# Patient Record
Sex: Female | Born: 1955 | Race: White | Hispanic: No | Marital: Married | Smoking: Never smoker
Health system: Southern US, Community
[De-identification: ages and names within clinical notes are randomized; demographics above are authoritative.]

## PROBLEM LIST (undated history)

## (undated) DIAGNOSIS — N183 Chronic kidney disease, stage 3 unspecified: Secondary | ICD-10-CM

## (undated) DIAGNOSIS — I10 Essential (primary) hypertension: Secondary | ICD-10-CM

## (undated) DIAGNOSIS — E669 Obesity, unspecified: Secondary | ICD-10-CM

## (undated) DIAGNOSIS — I5189 Other ill-defined heart diseases: Secondary | ICD-10-CM

## (undated) DIAGNOSIS — I739 Peripheral vascular disease, unspecified: Secondary | ICD-10-CM

## (undated) DIAGNOSIS — I2510 Atherosclerotic heart disease of native coronary artery without angina pectoris: Secondary | ICD-10-CM

## (undated) DIAGNOSIS — E785 Hyperlipidemia, unspecified: Secondary | ICD-10-CM

## (undated) HISTORY — DX: Obesity, unspecified: E66.9

## (undated) HISTORY — PX: CARDIAC CATHETERIZATION: SHX172

## (undated) HISTORY — DX: Peripheral vascular disease, unspecified: I73.9

## (undated) HISTORY — DX: Atherosclerotic heart disease of native coronary artery without angina pectoris: I25.10

## (undated) HISTORY — DX: Other ill-defined heart diseases: I51.89

## (undated) HISTORY — DX: Essential (primary) hypertension: I10

---

## 2008-08-08 ENCOUNTER — Encounter: Payer: Self-pay | Admitting: Cardiovascular Disease

## 2008-08-22 ENCOUNTER — Encounter: Payer: Self-pay | Admitting: Cardiovascular Disease

## 2008-09-05 ENCOUNTER — Encounter: Payer: Self-pay | Admitting: Cardiovascular Disease

## 2008-09-05 ENCOUNTER — Other Ambulatory Visit: Payer: Self-pay

## 2008-09-05 ENCOUNTER — Observation Stay: Payer: Self-pay | Admitting: *Deleted

## 2008-09-05 ENCOUNTER — Ambulatory Visit: Payer: Self-pay | Admitting: Cardiovascular Disease

## 2009-02-02 IMAGING — CR CERVICAL SPINE - 2-3 VIEW
1 series · 6 of 6 positions shown · non-contrast
Comparison: none

REASON FOR EXAM: neck pain
COMMENTS:

[Series 1: view not recorded · 0.17mm/px · 6 of 6 slices shown]
[im 1/6]
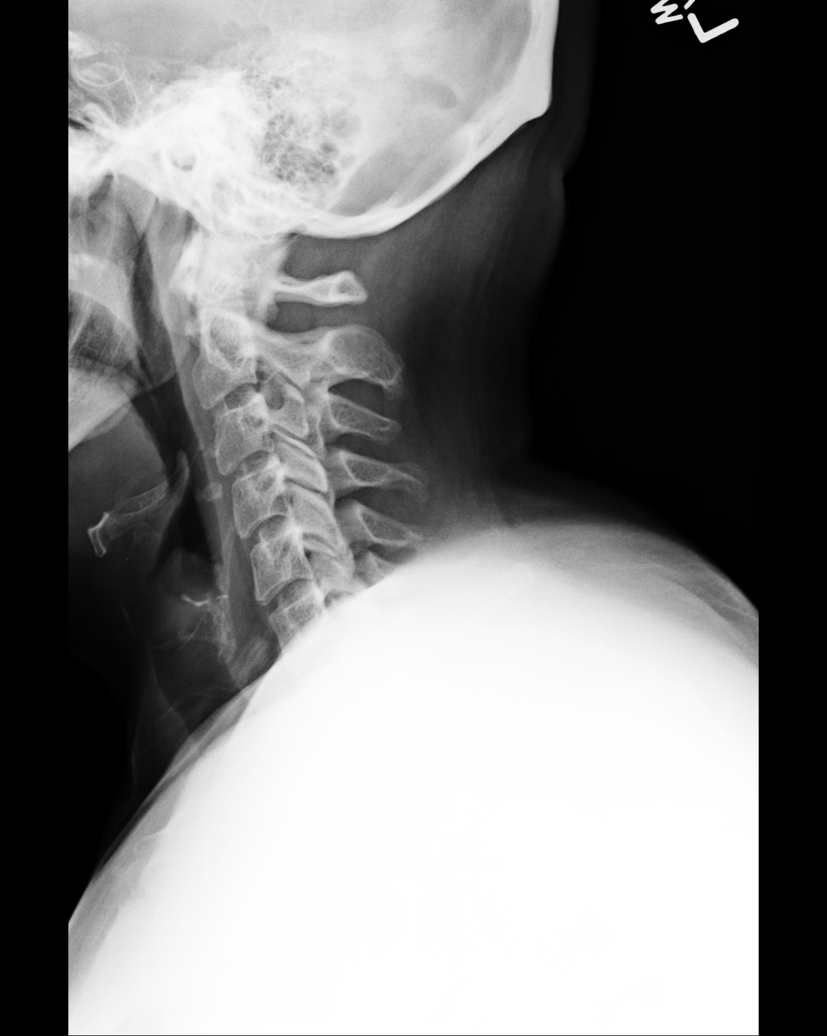
[im 2/6]
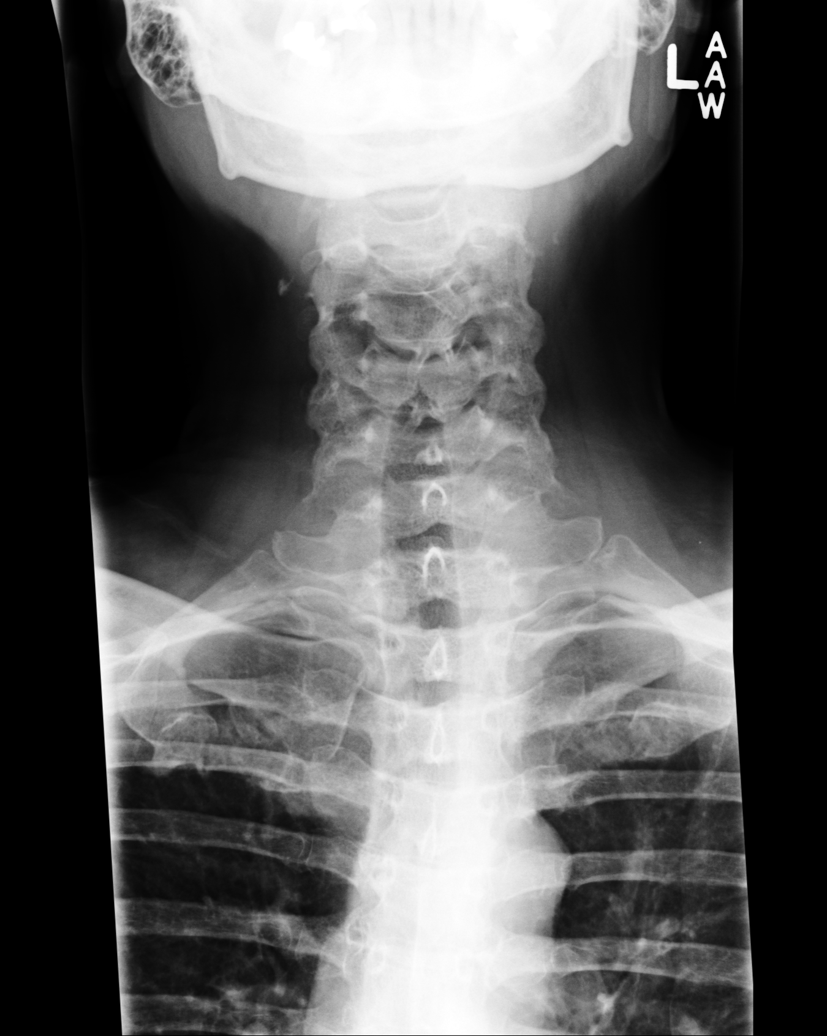
[im 3/6]
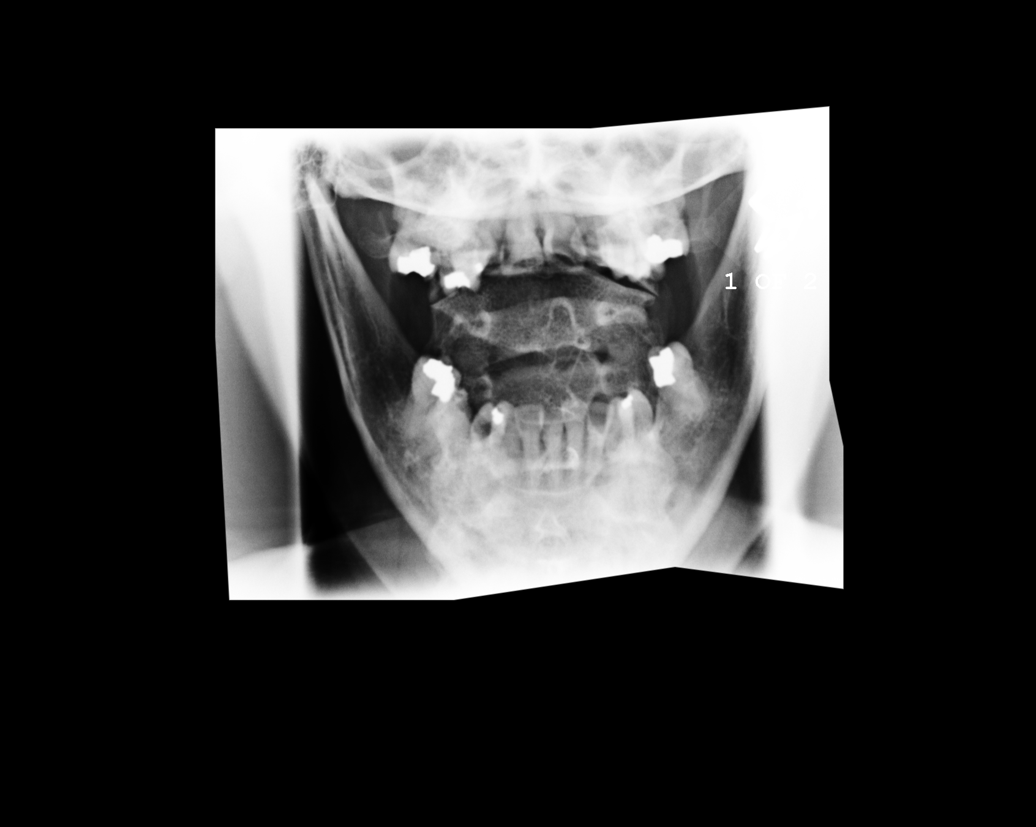
[im 4/6]
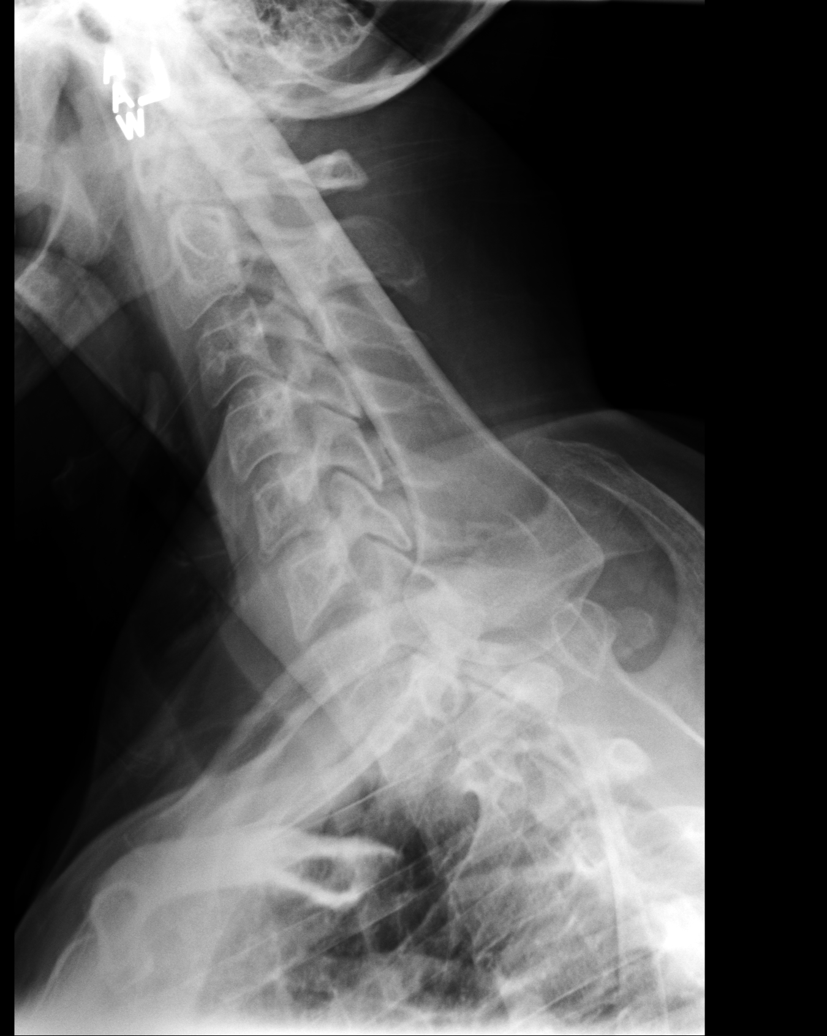
[im 5/6]
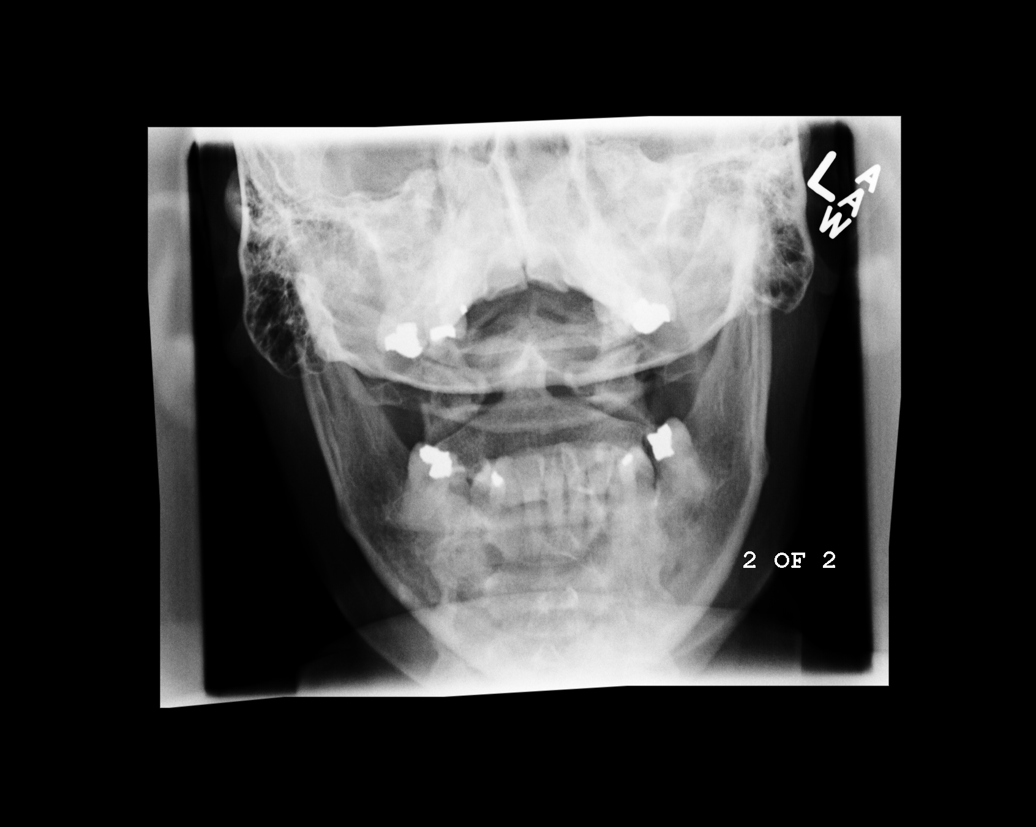
[im 6/6]
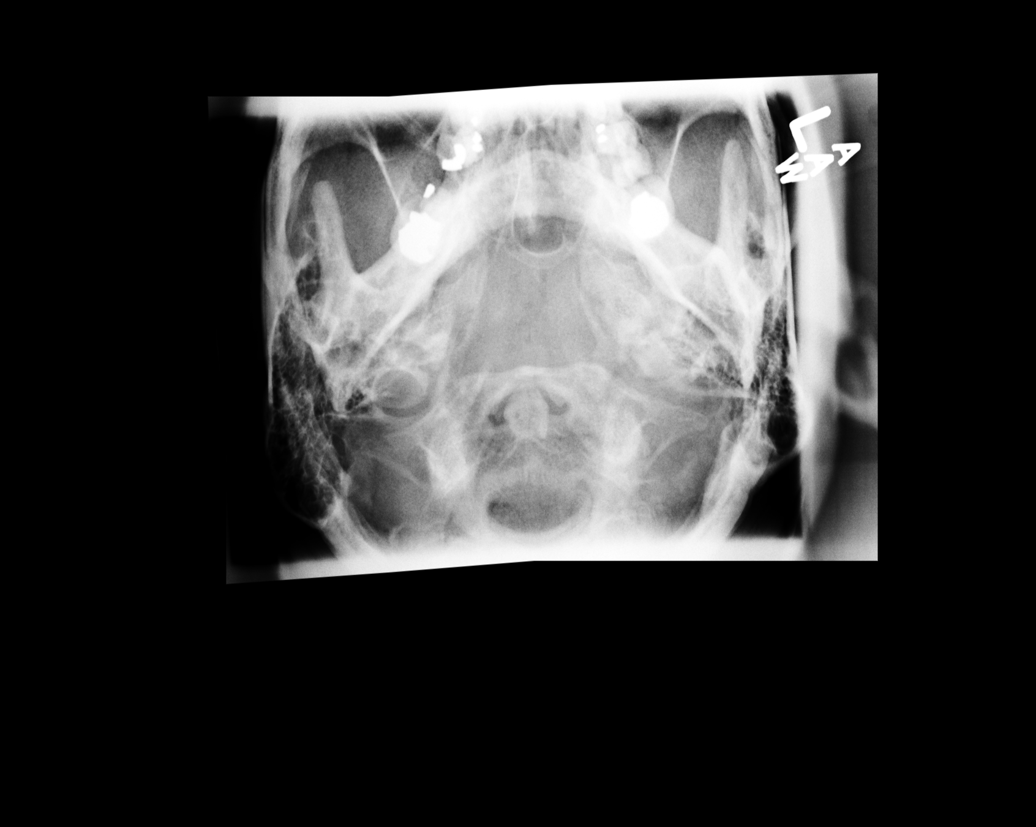

[6 of 6 positions shown; findings below may reference images not displayed]

PROCEDURE:     DXR - DXR C- SPINE AP AND LATERAL  - September 05, 2008  [DATE]

RESULT:     The vertebral body heights and the intervertebral disc spaces
are well maintained. The vertebral body alignment is normal. The odontoid
process is intact. There is noted a mild cervicothoracic scoliosis with a
convexity to the LEFT.
IMPRESSION: 1. No acute bony abnormalities are identified.
2. There is an apparent mild cervicothoracic scoliosis.

## 2009-02-02 IMAGING — CR DG CHEST 1V PORT
1 series · 1 of 1 positions shown · non-contrast
Comparison: none

REASON FOR EXAM: syncope
COMMENTS:

PROCEDURE:     DXR - DXR PORTABLE CHEST SINGLE VIEW  - September 05, 2008  [DATE]
RESULT:     The lung fields are clear.  The heart, mediastinal and osseous
structures show no significant abnormalities.

[view not recorded]
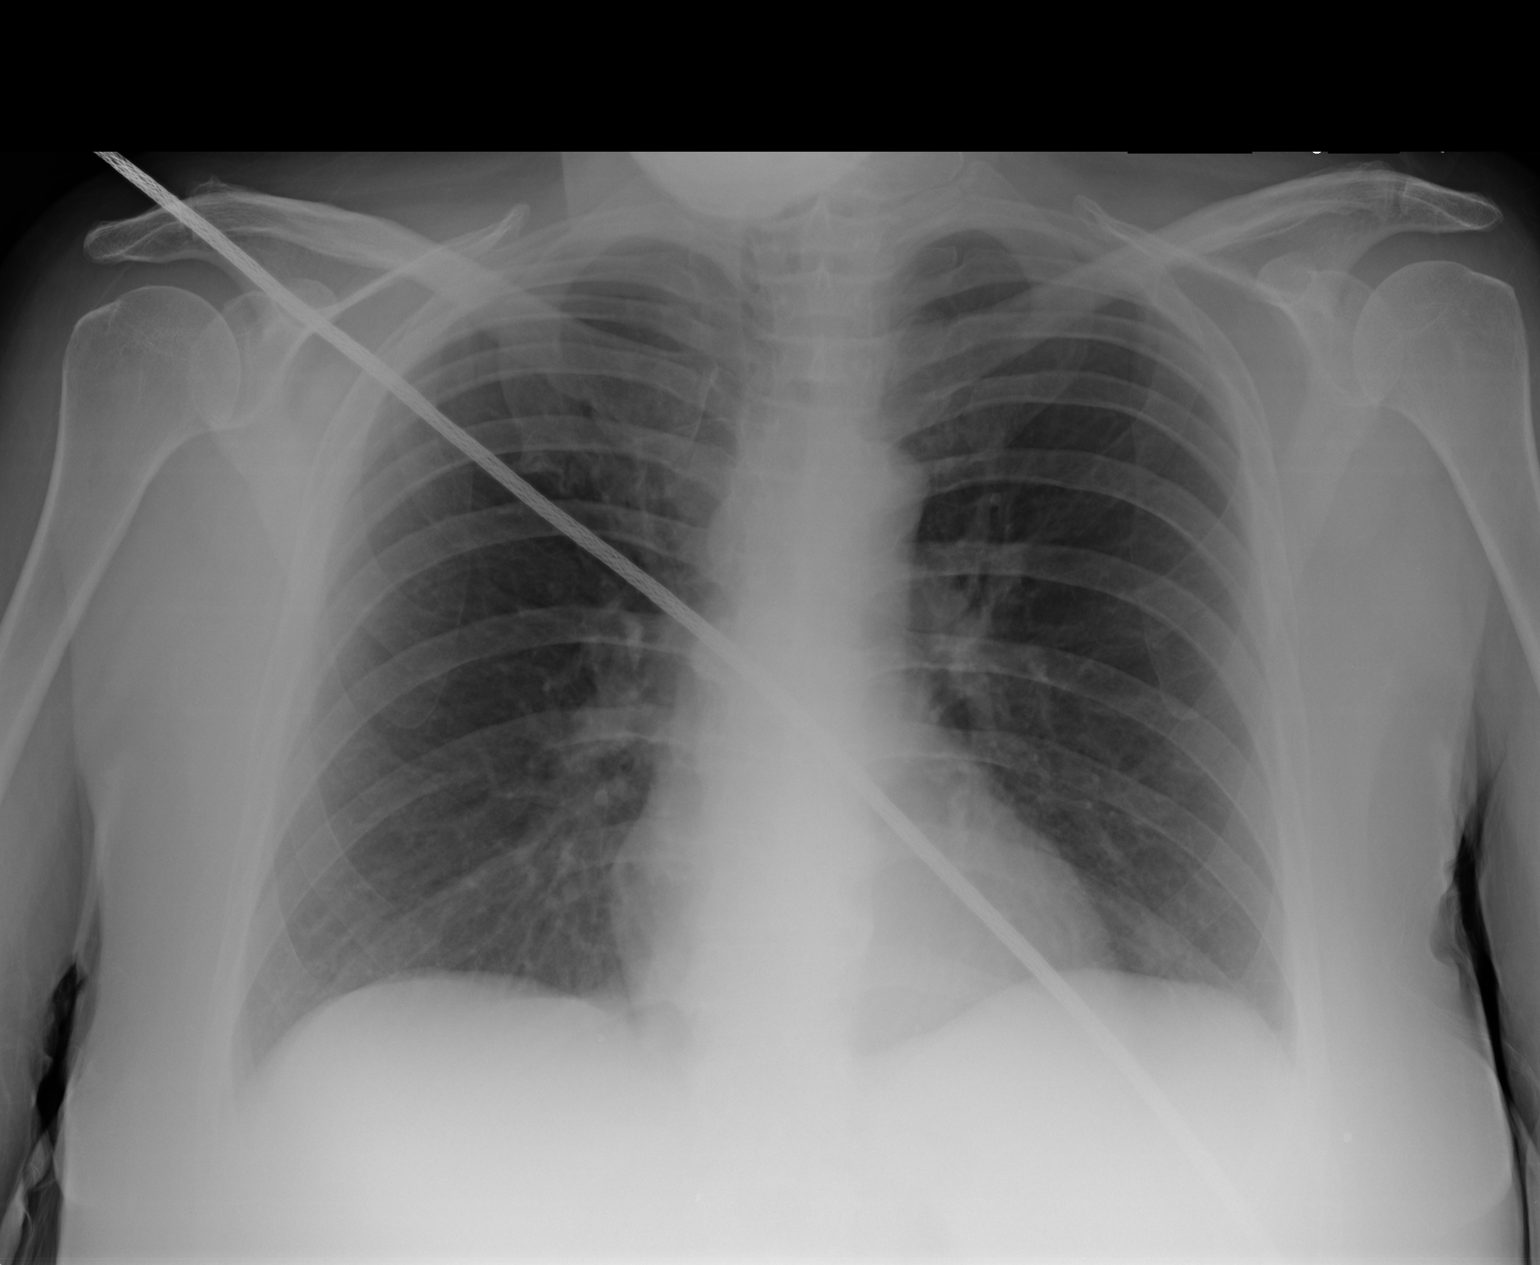

[1 of 1 positions shown; findings below may reference images not displayed]

IMPRESSION: No acute changes are identified.

## 2009-11-13 ENCOUNTER — Encounter: Payer: Self-pay | Admitting: Cardiovascular Disease

## 2009-11-16 ENCOUNTER — Encounter: Payer: Self-pay | Admitting: Cardiovascular Disease

## 2009-11-16 LAB — CONVERTED CEMR LAB
ALT: 18 units/L
AST: 18 units/L
Albumin: 4.1 g/dL
Alkaline Phosphatase: 73 units/L
BUN: 17 mg/dL
CO2: 25 meq/L
Calcium: 9 mg/dL
Chloride: 103 meq/L
Cholesterol: 119 mg/dL
Creatinine, Ser: 0.7 mg/dL
Glucose, Bld: 196 mg/dL
HDL: 51 mg/dL
LDL (calc): 58 mg/dL
Potassium: 4.3 meq/L
Sodium: 139 meq/L
Total Bilirubin: 0.6 mg/dL
Total Protein: 6.1 g/dL
Triglyceride fasting, serum: 49 mg/dL

## 2009-12-12 HISTORY — PX: OTHER SURGICAL HISTORY: SHX169

## 2010-05-13 ENCOUNTER — Ambulatory Visit: Payer: Self-pay | Admitting: Family Medicine

## 2010-06-21 ENCOUNTER — Telehealth: Payer: Self-pay | Admitting: Cardiovascular Disease

## 2010-06-21 ENCOUNTER — Encounter: Payer: Self-pay | Admitting: Cardiovascular Disease

## 2010-06-23 ENCOUNTER — Encounter: Payer: Self-pay | Admitting: Cardiovascular Disease

## 2010-06-24 ENCOUNTER — Encounter: Payer: Self-pay | Admitting: Cardiovascular Disease

## 2010-09-17 ENCOUNTER — Telehealth: Payer: Self-pay | Admitting: Cardiovascular Disease

## 2010-11-11 ENCOUNTER — Ambulatory Visit: Payer: Self-pay | Admitting: Cardiovascular Disease

## 2010-11-11 DIAGNOSIS — Z794 Long term (current) use of insulin: Secondary | ICD-10-CM | POA: Insufficient documentation

## 2010-11-11 DIAGNOSIS — I152 Hypertension secondary to endocrine disorders: Secondary | ICD-10-CM | POA: Insufficient documentation

## 2010-11-11 DIAGNOSIS — I2510 Atherosclerotic heart disease of native coronary artery without angina pectoris: Secondary | ICD-10-CM | POA: Insufficient documentation

## 2010-11-11 DIAGNOSIS — R55 Syncope and collapse: Secondary | ICD-10-CM | POA: Insufficient documentation

## 2010-11-11 DIAGNOSIS — I6529 Occlusion and stenosis of unspecified carotid artery: Secondary | ICD-10-CM | POA: Insufficient documentation

## 2010-11-11 DIAGNOSIS — I2541 Coronary artery aneurysm: Secondary | ICD-10-CM | POA: Insufficient documentation

## 2010-11-11 DIAGNOSIS — I10 Essential (primary) hypertension: Secondary | ICD-10-CM | POA: Insufficient documentation

## 2010-11-11 DIAGNOSIS — E119 Type 2 diabetes mellitus without complications: Secondary | ICD-10-CM | POA: Insufficient documentation

## 2010-11-11 DIAGNOSIS — E785 Hyperlipidemia, unspecified: Secondary | ICD-10-CM | POA: Insufficient documentation

## 2011-01-11 NOTE — Progress Notes (Signed)
Summary: Office Visit  Office Visit   Imported By: Shanda Howells 11/12/2010 11:51:30  _____________________________________________________________________  External Attachment:    Type:   Image     Comment:   External Document

## 2011-01-11 NOTE — Miscellaneous (Signed)
Summary: Added new medication  Clinical Lists Changes  Medications: Added new medication of METOPROLOL SUCCINATE 50 MG XR24H-TAB (METOPROLOL SUCCINATE) 1 once daily

## 2011-01-11 NOTE — Cardiovascular Report (Signed)
Summary: Cardiac Cath Other  Cardiac Cath Other   Imported By: Shanda Howells 11/12/2010 11:49:33  _____________________________________________________________________  External Attachment:    Type:   Image     Comment:   External Document

## 2011-01-11 NOTE — Letter (Signed)
Summary: RELEASE OF RECORDS  RELEASE OF RECORDS   Imported By: Cathie Beams Chriscoe 06/21/2010 10:10:47  _____________________________________________________________________  External Attachment:    Type:   Image     Comment:   External Document

## 2011-01-11 NOTE — Assessment & Plan Note (Signed)
Summary: NEW PT   Visit Type:  Initial Consult Primary Provider:  Maurine Cane  CC:  establish care. denies chest pain, SOB, and or palpitations..  History of Present Illness: 55 yo woman with PMH of obesity, syncope though secondary to vasovagal etiology managed with b-blockers, mild to moderate CAD, diabetes with latest HBA1C 7.3, HTN, hyperlipidemia, who presents to reestablish care. she was last seen at [facility] on 11/13/2009.  She state sthat since I have last seen her, she has had problems with her eye. She was told that she has a retinal vein occlusion. She has had numerous laser treatments. Otherwise, she denies any SOB, chest pain, no lightheadedness, and no syncope or near syncope.  ECHO 07/2008: essentially a normal study, EF >55%  Stress test: 08/2008; perfusion ABN in the RCA territory, ischemia in apical, apical lateral and inferoseptal walls.  Cardiac cath: 40% diagional vessel, 40% LAD, 25% RCA and left main disease.  Possible renal artery stenosis on the left. EF normal with LVH  LABS from 11/2009: Chol 119, LDL 58, HDL 51, TG 49  EKG: NSR with rate of 77 bpm, no signifiant ST or T wave changes, LAD  Preventive Screening-Counseling & Management  Alcohol-Tobacco     Smoking Status: never  Caffeine-Diet-Exercise     Does Patient Exercise: no      Drug Use:  no.    Current Medications (verified): 1)  Metoprolol Succinate 50 Mg Xr24h-Tab (Metoprolol Succinate) .Marland Kitchen.. 1 Once Daily 2)  Aspirin 81 Mg Tbec (Aspirin) .Marland Kitchen.. 1 Tab Once Daily 3)  Lisinopril-Hydrochlorothiazide 20-12.5 Mg Tabs (Lisinopril-Hydrochlorothiazide) .Marland Kitchen.. 1 Tab Once Daily 4)  Vytorin 10-40 Mg Tabs (Ezetimibe-Simvastatin) .Marland Kitchen.. 1 Tab Once Daily 5)  Humulin 70/30 70-30 % Susp (Insulin Isophane & Regular) .... As Directed 6)  Janumet 50-1000 Mg Tabs (Sitagliptin-Metformin Hcl) .Marland Kitchen.. 1 Tablet Two Times A Day 7)  Neurontin 600 Mg Tabs (Gabapentin) .Marland Kitchen.. 1 Tablet Two Times A Day  Allergies (verified): 1)   ! * Lyrica  Past History:  Past Surgical History: C-section-1981 Cyst taken off finger- 2011  Family History: Father:Diabetic, congestive heart failure, HTN, lupus-deceased Mother:COPD-deceased  Social History: Full Time Married  Tobacco Use - No.  Alcohol Use - no Regular Exercise - no Drug Use - no Smoking Status:  never Does Patient Exercise:  no Drug Use:  no  Review of Systems       The patient complains of weight gain.  The patient denies fever, weight loss, vision loss, decreased hearing, hoarseness, chest pain, syncope, dyspnea on exertion, peripheral edema, prolonged cough, abdominal pain, incontinence, muscle weakness, depression, and enlarged lymph nodes.    Vital Signs:  Patient profile:   55 year old female Height:      65 inches Weight:      251.50 pounds BMI:     42.00 Pulse rate:   77 / minute BP sitting:   138 / 88  (left arm) Cuff size:   regular  Vitals Entered By: Rodman Comp CMA (November 11, 2010 10:38 AM)  Physical Exam  General:  Well developed, well nourished, in no acute distress. Head:  normocephalic and atraumatic Neck:  Neck supple, no JVD. No masses, thyromegaly or abnormal cervical nodes. Lungs:  Clear bilaterally to auscultation and percussion. Heart:  Non-displaced PMI, chest non-tender; regular rate and rhythm, S1, S2 without murmurs, rubs or gallops. Carotid upstroke normal, 1+ bruit on the left. Pedals normal pulses. No edema, no varicosities. Abdomen:  Bowel sounds positive; abdomen soft and  non-tender without masses, obese Msk:  Back normal, normal gait. Muscle strength and tone normal. Pulses:  pulses normal in all 4 extremities Extremities:  No clubbing or cyanosis. Neurologic:  Alert and oriented x 3. Skin:  Intact without lesions or rashes. Psych:  Normal affect.   Impression & Recommendations:  Problem # 1:  SYNCOPE AND COLLAPSE (ICD-780.2) History of syncope, no symptoms recently. Will continue metoprolol for  suspected vasovagal syncope.  Her updated medication list for this problem includes:    Metoprolol Succinate 50 Mg Xr24h-tab (Metoprolol succinate) .Marland Kitchen... 0.5 tablet once daily    Aspirin 81 Mg Tbec (Aspirin) .Marland Kitchen... 1 tab once daily    Lisinopril-hydrochlorothiazide 20-12.5 Mg Tabs (Lisinopril-hydrochlorothiazide) .Marland Kitchen... 2 tab once daily  Problem # 2:  HYPERTENSION, BENIGN (ICD-401.1) BP is mildly elevated. we have suggested that she monitor her BP and increase her lisinopril /HCTZ to 40/25 mg daily for SBP >140  Her updated medication list for this problem includes:    Metoprolol Succinate 50 Mg Xr24h-tab (Metoprolol succinate) .Marland Kitchen... 0.5 tablet once daily    Aspirin 81 Mg Tbec (Aspirin) .Marland Kitchen... 1 tab once daily    Lisinopril-hydrochlorothiazide 20-12.5 Mg Tabs (Lisinopril-hydrochlorothiazide) .Marland Kitchen... 2 tab once daily  Problem # 3:  HYPERLIPIDEMIA-MIXED (ICD-272.4) She woudl like to change her vytorin as it is expensive for her. We will change to lipitor 40 mg daily.  The following medications were removed from the medication list:    Vytorin 10-40 Mg Tabs (Ezetimibe-simvastatin) .Marland Kitchen... 1 tab once daily Her updated medication list for this problem includes:    Lipitor 40 Mg Tabs (Atorvastatin calcium) .Marland Kitchen... Take one tablet by mouth daily.  Problem # 4:  CAD, NATIVE VESSEL (ICD-414.01) No further testing at this time. No signs of angina. Continue aggressive management. LDL <70  Her updated medication list for this problem includes:    Metoprolol Succinate 50 Mg Xr24h-tab (Metoprolol succinate) .Marland Kitchen... 0.5 tablet once daily    Aspirin 81 Mg Tbec (Aspirin) .Marland Kitchen... 1 tab once daily    Lisinopril-hydrochlorothiazide 20-12.5 Mg Tabs (Lisinopril-hydrochlorothiazide) .Marland Kitchen... 2 tab once daily  Problem # 5:  DM (ICD-250.00) Encouraged her to lose weight in effort to dcrease HBA1C to <7 (preferably 6.5)  Her updated medication list for this problem includes:    Aspirin 81 Mg Tbec (Aspirin) .Marland Kitchen... 1 tab  once daily    Lisinopril-hydrochlorothiazide 20-12.5 Mg Tabs (Lisinopril-hydrochlorothiazide) .Marland Kitchen... 2 tab once daily    Humulin 70/30 70-30 % Susp (Insulin isophane & regular) .Marland Kitchen... As directed    Janumet 50-1000 Mg Tabs (Sitagliptin-metformin hcl) .Marland Kitchen... 1 tablet two times a day  Problem # 6:  CAROTID ARTERY STENOSIS, WITHOUT INFARCTION (ICD-433.10) Bruit on the left. Will try to obtain old u/s from [facility].  Her updated medication list for this problem includes:    Aspirin 81 Mg Tbec (Aspirin) .Marland Kitchen... 1 tab once daily  Patient Instructions: 1)  Your physician recommends that you schedule a follow-up appointment in: 1 year 2)  Your physician has recommended you make the following change in your medication: Stop Vytorin, Start Lipitor 40mg  once daily.  Increase Lisinopril/HCT 20/12.5mg  to 2 tablets daily. Prescriptions: LISINOPRIL-HYDROCHLOROTHIAZIDE 20-12.5 MG TABS (LISINOPRIL-HYDROCHLOROTHIAZIDE) 2 tab once daily  #60 x 6   Entered by:   Darlyne Russian RN   Authorized by:   Esmond Plants MD   Signed by:   Esmond Plants MD on 11/11/2010   Method used:   Electronically to        [facility] (retail)  [address]       [place], [place]  [address]       Ph: [phone]       Fax: [phone]   RxID:   [id number]

## 2011-01-11 NOTE — Progress Notes (Signed)
Summary: RX  Phone Note Refill Request Call back at Home Phone [phone] Message from:  Patient on September 17, 2010 4:39 PM  Refills Requested: Medication #1:  VYTORIN 10-40 MG TABS 1 tab once daily  Medication #2:  METOPROLOL SUCCINATE 50 MG XR24H-TAB 1 once daily  Medication #3:  LISINOPRIL-HYDROCHLOROTHIAZIDE 20-12.5 MG TABS 1 tab once daily [facility] [address]  Initial call taken by: Zenovia Jarred,  September 17, 2010 4:40 PM    Prescriptions: METOPROLOL SUCCINATE 50 MG XR24H-TAB (METOPROLOL SUCCINATE) 1 once daily  #30 x 4   Entered by:   Dolores Lory, CMA   Authorized by:   Esmond Plants MD   Signed by:   Dolores Lory, CMA on 09/17/2010   Method used:   Electronically to        [facility] (retail)       [address], [place], [place]  [address]       Ph: [phone]       Fax: [phone]   RxID:   [id number] VYTORIN 10-40 MG TABS (EZETIMIBE-SIMVASTATIN) 1 tab once daily  #30 x 4   Entered by:   Dolores Lory, CMA   Authorized by:   Esmond Plants MD   Signed by:   Dolores Lory, CMA on 09/17/2010   Method used:   Electronically to        [facility] (retail)       [address], [place], [place]  [address]       Ph: [phone]       Fax: [phone]   RxID:   [id number] LISINOPRIL-HYDROCHLOROTHIAZIDE 20-12.5 MG TABS (LISINOPRIL-HYDROCHLOROTHIAZIDE) 1 tab once daily  #30 x 4   Entered by:   Dolores Lory, CMA   Authorized by:   Esmond Plants MD   Signed by:   Dolores Lory, CMA on 09/17/2010   Method used:   Electronically to        [facility]  [id number] [facility] (retail)       [place] [address], [address]       [place], [place]  [address]       Ph: [phone]       Fax: [phone]   RxID:   [id number]

## 2011-01-11 NOTE — Progress Notes (Signed)
Summary: refill metoprolol  Phone Note Call from Patient   Caller: Jasmine Davidson Reason for Call: Refill Medication Summary of Call: Needs a refill on Metoprolol ER 50mg  one tablet everyday. Initial call taken by: Dolores Lory, CMA,  June 21, 2010 12:36 PM     Appended Document: refill metoprolol Refill called in for Metoprolol ER 50 mg one tablet everyday #30 tablets with 5 refills.

## 2011-06-14 ENCOUNTER — Other Ambulatory Visit: Payer: Self-pay | Admitting: Cardiovascular Disease

## 2011-07-05 ENCOUNTER — Encounter: Payer: Self-pay | Admitting: Cardiovascular Disease

## 2011-07-15 ENCOUNTER — Other Ambulatory Visit: Payer: Self-pay | Admitting: Cardiovascular Disease

## 2011-07-19 ENCOUNTER — Telehealth: Payer: Self-pay

## 2011-07-19 MED ORDER — ATORVASTATIN CALCIUM 40 MG PO TABS: 40.0000 mg | ORAL_TABLET | Freq: Every day | ORAL | Status: DC

## 2011-07-19 MED ORDER — METOPROLOL SUCCINATE ER 50 MG PO TB24: 50.0000 mg | ORAL_TABLET | Freq: Every day | ORAL | Status: DC

## 2011-07-19 NOTE — Telephone Encounter (Signed)
Refill sent to [facility] for metoprolol & atorvastatin.

## 2011-07-19 NOTE — Telephone Encounter (Signed)
Needs a refill sent for atorvastatin & metoprolol.

## 2011-12-21 ENCOUNTER — Telehealth: Payer: Self-pay

## 2011-12-21 MED ORDER — LISINOPRIL-HYDROCHLOROTHIAZIDE 20-12.5 MG PO TABS: 2.0000 | ORAL_TABLET | Freq: Every day | ORAL | Status: DC

## 2011-12-21 MED ORDER — METOPROLOL SUCCINATE ER 50 MG PO TB24: 50.0000 mg | ORAL_TABLET | Freq: Every day | ORAL | Status: DC

## 2011-12-21 NOTE — Telephone Encounter (Signed)
Refill sent for lisinopril  

## 2012-01-05 ENCOUNTER — Other Ambulatory Visit: Payer: Self-pay | Admitting: Cardiovascular Disease

## 2012-01-06 ENCOUNTER — Telehealth: Payer: Self-pay

## 2012-01-06 MED ORDER — LISINOPRIL-HYDROCHLOROTHIAZIDE 20-12.5 MG PO TABS: 2.0000 | ORAL_TABLET | Freq: Every day | ORAL | Status: DC

## 2012-01-06 NOTE — Telephone Encounter (Signed)
Refill sent for lisinopril hctz

## 2012-01-17 ENCOUNTER — Ambulatory Visit: Payer: Self-pay | Admitting: Ophthalmology

## 2012-01-17 LAB — POTASSIUM: Potassium: 4.3 mmol/L (ref 3.5–5.1)

## 2012-01-23 ENCOUNTER — Encounter: Payer: Self-pay | Admitting: *Deleted

## 2012-01-26 ENCOUNTER — Ambulatory Visit (INDEPENDENT_AMBULATORY_CARE_PROVIDER_SITE_OTHER): Payer: Commercial Indemnity | Admitting: Cardiovascular Disease

## 2012-01-26 ENCOUNTER — Encounter: Payer: Self-pay | Admitting: Cardiovascular Disease

## 2012-01-26 DIAGNOSIS — E119 Type 2 diabetes mellitus without complications: Secondary | ICD-10-CM

## 2012-01-26 DIAGNOSIS — I2510 Atherosclerotic heart disease of native coronary artery without angina pectoris: Secondary | ICD-10-CM

## 2012-01-26 DIAGNOSIS — E785 Hyperlipidemia, unspecified: Secondary | ICD-10-CM

## 2012-01-26 DIAGNOSIS — R55 Syncope and collapse: Secondary | ICD-10-CM

## 2012-01-26 DIAGNOSIS — I10 Essential (primary) hypertension: Secondary | ICD-10-CM

## 2012-01-26 NOTE — Assessment & Plan Note (Signed)
No further episodes of syncope. Previously felt to be vasovagal.

## 2012-01-26 NOTE — Assessment & Plan Note (Signed)
Cholesterol is at goal on the current lipid regimen. No changes to the medications were made.  

## 2012-01-26 NOTE — Patient Instructions (Signed)
You are doing well. No medication changes were made.  Please call us if you have new issues that need to be addressed before your next appt.  Your physician wants you to follow-up in: 12 months.  You will receive a reminder letter in the mail two months in advance. If you don't receive a letter, please call our office to schedule the follow-up appointment. 

## 2012-01-26 NOTE — Assessment & Plan Note (Signed)
We have encouraged continued exercise, careful diet management in an effort to lose weight. 

## 2012-01-26 NOTE — Assessment & Plan Note (Signed)
Repeat blood pressure was improved though borderline elevated. We have asked her to closely monitor her blood pressure at home and call our office or [facility] for further medication titration if her systolic pressure is more than 135.

## 2012-01-26 NOTE — Assessment & Plan Note (Signed)
Currently with no symptoms of angina. No further workup at this time. Continue current medication regimen. 

## 2012-01-26 NOTE — Progress Notes (Signed)
Patient ID: Jasmine Davidson, female    DOB: [birth year], 56 y.o.   MRN: [id number]  HPI Comments: 56 yo woman with PMH of obesity, syncope though secondary to vasovagal etiology managed with b-blockers, mild to moderate CAD, diabetes, HTN, hyperlipidemia, who presents 4 routine followup.  She reports that she has been doing well. In the past, she has had problems with her eyes and was told that she had a retinal vein occlusion. She has had numerous laser treatments. Otherwise, she denies any SOB, chest pain, no lightheadedness, and no syncope or near syncope. Her weight has been trending upwards. Her hemoglobin A1c is very elevated, rated at 9. She does not exercise   ECHO 07/2008: essentially a normal study, EF >55%   Stress test: 08/2008; perfusion ABN in the RCA territory, ischemia in apical, apical lateral and inferoseptal walls.   Cardiac cath: 40% diagional vessel, 40% LAD, 25% RCA and left main disease.   Possible renal artery stenosis on the left. EF normal with LVH   She reports her most recent total cholesterol was 130   EKG: NSR with no signifiant ST or T wave changes, LAD      Outpatient Encounter Prescriptions as of 01/26/2012  Medication Sig Dispense Refill  . aspirin (ASPIR-81) 81 MG EC tablet Take 81 mg by mouth daily.        Marland Kitchen atorvastatin (LIPITOR) 40 MG tablet Take 1 tablet (40 mg total) by mouth daily.  30 tablet  6  . gabapentin (NEURONTIN) 600 MG tablet Take 600 mg by mouth 2 (two) times daily.        . insulin NPH-insulin regular (HUMULIN 70/30) (70-30) 100 UNIT/ML injection Inject into the skin as directed.        Marland Kitchen lisinopril-hydrochlorothiazide (PRINZIDE,ZESTORETIC) 20-12.5 MG per tablet TAKE TWO TABLETS BY MOUTH EVERY DAY  60 tablet  5  . metoprolol (TOPROL-XL) 50 MG 24 hr tablet Take 1 tablet (50 mg total) by mouth daily.  30 tablet  6  . sitaGLIPtan-metformin (JANUMET) 50-1000 MG per tablet Take 1 tablet by mouth 2 (two) times daily.        Marland Kitchen DISCONTD:  lisinopril-hydrochlorothiazide (PRINZIDE,ZESTORETIC) 20-12.5 MG per tablet Take 2 tablets by mouth daily.  60 tablet  5    Review of Systems  Constitutional: Negative.   HENT: Negative.   Eyes: Negative.   Respiratory: Negative.   Cardiovascular: Negative.   Gastrointestinal: Negative.   Musculoskeletal: Negative.   Skin: Negative.   Neurological: Negative.   Hematological: Negative.   Psychiatric/Behavioral: Negative.   All other systems reviewed and are negative.    BP 140/80  Pulse 61  Resp 16  Ht 5\' 6"  (1.676 m)  Wt 251 lb (113.853 kg)  BMI 40.51 kg/m2   Physical Exam  Nursing note and vitals reviewed. Constitutional: She is oriented to person, place, and time. She appears well-developed and well-nourished.  HENT:  Head: Normocephalic.  Nose: Nose normal.  Mouth/Throat: Oropharynx is clear and moist.  Eyes: Conjunctivae are normal. Pupils are equal, round, and reactive to light.  Neck: Normal range of motion. Neck supple. No JVD present.  Cardiovascular: Normal rate, regular rhythm, S1 normal, S2 normal, normal heart sounds and intact distal pulses.  Exam reveals no gallop and no friction rub.   No murmur heard. Pulmonary/Chest: Effort normal and breath sounds normal. No respiratory distress. She has no wheezes. She has no rales. She exhibits no tenderness.  Abdominal: Soft. Bowel sounds are normal. She exhibits no distension.  There is no tenderness.  Musculoskeletal: Normal range of motion. She exhibits no edema and no tenderness.  Lymphadenopathy:    She has no cervical adenopathy.  Neurological: She is alert and oriented to person, place, and time. Coordination normal.  Skin: Skin is warm and dry. No rash noted. No erythema.  Psychiatric: She has a normal mood and affect. Her behavior is normal. Judgment and thought content normal.         Assessment and Plan

## 2012-01-31 ENCOUNTER — Ambulatory Visit: Payer: Self-pay | Admitting: Ophthalmology

## 2012-04-25 ENCOUNTER — Ambulatory Visit: Payer: Self-pay | Admitting: Ophthalmology

## 2012-04-25 LAB — POTASSIUM: Potassium: 4.6 mmol/L (ref 3.5–5.1)

## 2012-05-01 ENCOUNTER — Ambulatory Visit: Payer: Self-pay | Admitting: Ophthalmology

## 2012-07-12 ENCOUNTER — Other Ambulatory Visit: Payer: Self-pay | Admitting: Cardiovascular Disease

## 2012-07-12 NOTE — Telephone Encounter (Signed)
Refilled Lisinopril. 

## 2012-07-23 ENCOUNTER — Other Ambulatory Visit: Payer: Self-pay | Admitting: Cardiovascular Disease

## 2012-07-24 ENCOUNTER — Other Ambulatory Visit: Payer: Self-pay | Admitting: *Deleted

## 2012-07-24 MED ORDER — ATORVASTATIN CALCIUM 40 MG PO TABS: 40.0000 mg | ORAL_TABLET | Freq: Every day | ORAL | Status: DC

## 2012-07-24 NOTE — Telephone Encounter (Signed)
Refilled Lipitor

## 2012-09-10 ENCOUNTER — Other Ambulatory Visit: Payer: Self-pay | Admitting: Cardiovascular Disease

## 2012-09-11 ENCOUNTER — Other Ambulatory Visit: Payer: Self-pay | Admitting: *Deleted

## 2012-09-11 MED ORDER — METOPROLOL SUCCINATE ER 50 MG PO TB24: 50.0000 mg | ORAL_TABLET | Freq: Every day | ORAL | Status: DC

## 2012-09-11 NOTE — Telephone Encounter (Signed)
Refilled Metoprolol

## 2013-01-11 ENCOUNTER — Other Ambulatory Visit: Payer: Self-pay | Admitting: Cardiovascular Disease

## 2013-01-17 ENCOUNTER — Ambulatory Visit: Payer: Self-pay | Admitting: Podiatry

## 2013-02-08 HISTORY — PX: TOE SURGERY: SHX1073

## 2013-02-26 ENCOUNTER — Ambulatory Visit (INDEPENDENT_AMBULATORY_CARE_PROVIDER_SITE_OTHER): Payer: BC Managed Care – PPO | Admitting: Cardiovascular Disease

## 2013-02-26 ENCOUNTER — Encounter: Payer: Self-pay | Admitting: Cardiovascular Disease

## 2013-02-26 VITALS — BP 142/82 | HR 77 | Ht 65.0 in | Wt 257.8 lb

## 2013-02-26 DIAGNOSIS — I2510 Atherosclerotic heart disease of native coronary artery without angina pectoris: Secondary | ICD-10-CM

## 2013-02-26 DIAGNOSIS — E119 Type 2 diabetes mellitus without complications: Secondary | ICD-10-CM

## 2013-02-26 DIAGNOSIS — I10 Essential (primary) hypertension: Secondary | ICD-10-CM

## 2013-02-26 DIAGNOSIS — I6529 Occlusion and stenosis of unspecified carotid artery: Secondary | ICD-10-CM

## 2013-02-26 DIAGNOSIS — E785 Hyperlipidemia, unspecified: Secondary | ICD-10-CM

## 2013-02-26 NOTE — Assessment & Plan Note (Signed)
Blood pressure is well controlled on today's visit. No changes made to the medications. 

## 2013-02-26 NOTE — Patient Instructions (Addendum)
You are doing well. No medication changes were made.  Please call us if you have new issues that need to be addressed before your next appt.  Your physician wants you to follow-up in: 12 months.  You will receive a reminder letter in the mail two months in advance. If you don't receive a letter, please call our office to schedule the follow-up appointment. 

## 2013-02-26 NOTE — Assessment & Plan Note (Signed)
Bruit on exam but prior carotid ultrasound showing no disease

## 2013-02-26 NOTE — Progress Notes (Signed)
Patient ID: Jasmine Davidson, female    DOB: Aug 17, 1956, 57 y.o.   MRN: [id number]  HPI Comments: 57 yo woman with PMH of obesity, syncope though secondary to vasovagal etiology managed with b-blockers, mild to moderate CAD, diabetes, HTN, hyperlipidemia, who presents for routine followup.  previous problems with her eyes, told that she had a retinal vein occlusion. She has had numerous laser treatments.   she denies any SOB, chest pain, no lightheadedness, and no syncope or near syncope. She reports weight has been stable. Hemoglobin A1c trending down, down from 9 to 7.4 She has osteomyelitis of the left foot with recent indication of the top of her toe 09/30/2013. Still wearing a support boot, managed by Dr. Milinda Pointer   ECHO 07/2008: essentially a normal study, EF >55%   Stress test: 08/2008; perfusion ABN in the RCA territory, ischemia in apical, apical lateral and inferoseptal walls.   Cardiac cath: 40% diagional vessel, 40% LAD, 25% RCA and left main disease.   Possible renal artery stenosis on the left. EF normal with LVH   She reports her most recent total cholesterol was 130   EKG shows normal sinus rhythm with rate 76 beats per minute with no significant ST or T wave changes      Outpatient Encounter Prescriptions as of 02/26/2013  Medication Sig Dispense Refill  . aspirin (ASPIR-81) 81 MG EC tablet Take 81 mg by mouth daily.        Marland Kitchen atorvastatin (LIPITOR) 40 MG tablet Take 1 tablet (40 mg total) by mouth daily.  30 tablet  6  . gabapentin (NEURONTIN) 600 MG tablet Take 600 mg by mouth 2 (two) times daily.        . insulin NPH-insulin regular (HUMULIN 70/30) (70-30) 100 UNIT/ML injection Inject into the skin as directed.        Marland Kitchen lisinopril-hydrochlorothiazide (PRINZIDE,ZESTORETIC) 20-12.5 MG per tablet TAKE TWO TABLETS BY MOUTH EVERY DAY  60 tablet  4  . metoprolol succinate (TOPROL-XL) 50 MG 24 hr tablet       . sitaGLIPtan-metformin (JANUMET) 50-1000 MG per tablet Take 1 tablet by  mouth 2 (two) times daily.         Review of Systems  Constitutional: Negative.   HENT: Negative.   Eyes: Negative.   Respiratory: Negative.   Cardiovascular: Negative.   Gastrointestinal: Negative.   Musculoskeletal: Negative.   Skin: Negative.   Neurological: Negative.   Psychiatric/Behavioral: Negative.   All other systems reviewed and are negative.    BP 142/82  Pulse 77  Ht 5\' 5"  (1.651 m)  Wt 257 lb 12 oz (116.915 kg)  BMI 42.89 kg/m2   Physical Exam  Nursing note and vitals reviewed. Constitutional: She is oriented to person, place, and time. She appears well-developed and well-nourished.  HENT:  Head: Normocephalic.  Nose: Nose normal.  Mouth/Throat: Oropharynx is clear and moist.  Eyes: Conjunctivae are normal. Pupils are equal, round, and reactive to light.  Neck: Normal range of motion. Neck supple. No JVD present.  Cardiovascular: Normal rate, regular rhythm, S1 normal, S2 normal, normal heart sounds and intact distal pulses.  Exam reveals no gallop and no friction rub.   No murmur heard. Pulmonary/Chest: Effort normal and breath sounds normal. No respiratory distress. She has no wheezes. She has no rales. She exhibits no tenderness.  Abdominal: Soft. Bowel sounds are normal. She exhibits no distension. There is no tenderness.  Musculoskeletal: Normal range of motion. She exhibits no edema and no tenderness.  Lymphadenopathy:    She has no cervical adenopathy.  Neurological: She is alert and oriented to person, place, and time. Coordination normal.  Skin: Skin is warm and dry. No rash noted. No erythema.  Psychiatric: She has a normal mood and affect. Her behavior is normal. Judgment and thought content normal.    Assessment and Plan

## 2013-02-26 NOTE — Assessment & Plan Note (Signed)
Currently with no symptoms of angina. No further workup at this time. Continue current medication regimen.very encouraged that her diabetes numbers are improved. She is unable to exercise secondary to recent foot surgery.

## 2013-02-26 NOTE — Assessment & Plan Note (Signed)
Goal LDL less than 70. We will try to obtain lab work from [facility]

## 2013-02-26 NOTE — Assessment & Plan Note (Signed)
We have encouraged continued exercise, careful diet management in an effort to lose weight. 

## 2013-03-15 ENCOUNTER — Other Ambulatory Visit: Payer: Self-pay | Admitting: Cardiovascular Disease

## 2013-06-12 ENCOUNTER — Other Ambulatory Visit: Payer: Self-pay | Admitting: Cardiovascular Disease

## 2013-06-12 NOTE — Telephone Encounter (Signed)
Refilled Lisinopril sent to [facility].

## 2013-09-14 ENCOUNTER — Other Ambulatory Visit: Payer: Self-pay | Admitting: Cardiovascular Disease

## 2013-09-16 ENCOUNTER — Other Ambulatory Visit: Payer: Self-pay | Admitting: *Deleted

## 2013-09-16 MED ORDER — METOPROLOL SUCCINATE ER 50 MG PO TB24: 50.0000 mg | ORAL_TABLET | Freq: Every day | ORAL | Status: DC

## 2013-09-16 NOTE — Telephone Encounter (Signed)
Refilled Metoprolol sent to [facility].

## 2013-10-14 ENCOUNTER — Other Ambulatory Visit: Payer: Self-pay | Admitting: Cardiovascular Disease

## 2013-10-14 ENCOUNTER — Other Ambulatory Visit: Payer: Self-pay | Admitting: *Deleted

## 2013-10-14 MED ORDER — LISINOPRIL-HYDROCHLOROTHIAZIDE 20-12.5 MG PO TABS: ORAL_TABLET | ORAL | Status: DC

## 2013-10-14 NOTE — Telephone Encounter (Signed)
Requested Prescriptions   Signed Prescriptions Disp Refills  . lisinopril-hydrochlorothiazide (PRINZIDE,ZESTORETIC) 20-12.5 MG per tablet 60 tablet 3    Sig: TAKE TWO TABLETS BY MOUTH EVERY DAY    Authorizing Provider: Minna Merritts    Ordering User: Britt Bottom

## 2013-12-17 ENCOUNTER — Telehealth: Payer: Self-pay | Admitting: *Deleted

## 2013-12-17 MED ORDER — GABAPENTIN 800 MG PO TABS: 800.0000 mg | ORAL_TABLET | Freq: Three times a day (TID) | ORAL | Status: DC

## 2013-12-17 NOTE — Telephone Encounter (Signed)
[facility] sent fax req gabapentin [phone] with 3 refills. Sent to pharmacy.

## 2014-02-11 ENCOUNTER — Other Ambulatory Visit: Payer: Self-pay | Admitting: Cardiovascular Disease

## 2014-02-13 ENCOUNTER — Other Ambulatory Visit: Payer: Self-pay | Admitting: *Deleted

## 2014-02-13 MED ORDER — ATORVASTATIN CALCIUM 40 MG PO TABS: ORAL_TABLET | ORAL | Status: DC

## 2014-02-13 MED ORDER — LISINOPRIL-HYDROCHLOROTHIAZIDE 20-12.5 MG PO TABS: ORAL_TABLET | ORAL | Status: DC

## 2014-02-26 ENCOUNTER — Ambulatory Visit: Payer: BC Managed Care – PPO | Admitting: Cardiovascular Disease

## 2014-05-31 ENCOUNTER — Emergency Department: Payer: Self-pay | Admitting: Emergency Medicine

## 2014-05-31 LAB — COMPREHENSIVE METABOLIC PANEL
Albumin: 3.6 g/dL (ref 3.4–5.0)
Alkaline Phosphatase: 122 U/L — ABNORMAL HIGH
Anion Gap: 6 — ABNORMAL LOW (ref 7–16)
BUN: 26 mg/dL — ABNORMAL HIGH (ref 7–18)
Bilirubin,Total: 0.8 mg/dL (ref 0.2–1.0)
Calcium, Total: 9.6 mg/dL (ref 8.5–10.1)
Chloride: 105 mmol/L (ref 98–107)
Co2: 26 mmol/L (ref 21–32)
Creatinine: 0.94 mg/dL (ref 0.60–1.30)
EGFR (African American): >60
EGFR (Non-African Amer.): >60
Glucose: 137 mg/dL — ABNORMAL HIGH (ref 65–99)
Osmolality: 281 (ref 275–301)
Potassium: 4.4 mmol/L (ref 3.5–5.1)
SGOT(AST): 34 U/L (ref 15–37)
SGPT (ALT): 25 U/L (ref 12–78)
Sodium: 137 mmol/L (ref 136–145)
Total Protein: 7.1 g/dL (ref 6.4–8.2)

## 2014-05-31 LAB — URINALYSIS, COMPLETE
Bacteria: NONE SEEN
Bilirubin,UR: NEGATIVE
Glucose,UR: 50 mg/dL (ref 0–75)
Leukocyte Esterase: NEGATIVE
Nitrite: NEGATIVE
Ph: 7 (ref 4.5–8.0)
Protein: NEGATIVE
RBC,UR: <1 /HPF (ref 0–5)
Specific Gravity: 1.004 (ref 1.003–1.030)
Squamous Epithelial: <1
WBC UR: <1 /HPF (ref 0–5)

## 2014-05-31 LAB — TROPONIN I: Troponin-I: <0.02 ng/mL

## 2014-05-31 LAB — CBC
HCT: 42 % (ref 35.0–47.0)
HGB: 14.1 g/dL (ref 12.0–16.0)
MCH: 31.1 pg (ref 26.0–34.0)
MCHC: 33.7 g/dL (ref 32.0–36.0)
MCV: 93 fL (ref 80–100)
Platelet: 149 10*3/uL — ABNORMAL LOW (ref 150–440)
RBC: 4.55 10*6/uL (ref 3.80–5.20)
RDW: 14.1 % (ref 11.5–14.5)
WBC: 7.7 10*3/uL (ref 3.6–11.0)

## 2014-11-26 ENCOUNTER — Ambulatory Visit: Payer: Self-pay

## 2014-12-09 ENCOUNTER — Ambulatory Visit: Payer: Self-pay

## 2014-12-17 ENCOUNTER — Ambulatory Visit: Payer: Self-pay

## 2014-12-17 HISTORY — PX: BREAST BIOPSY: SHX20

## 2015-04-05 NOTE — Op Note (Signed)
PATIENT NAME:  Jasmine Davidson, Jasmine Davidson MR#:  [id number] DATE OF BIRTH:  [birth year]  DATE OF PROCEDURE:  05/01/2012  PREOPERATIVE DIAGNOSIS: Visually significant cataract of the left eye.   POSTOPERATIVE DIAGNOSIS: Visually significant cataract of the left eye.   OPERATIVE PROCEDURE: Cataract extraction by phacoemulsification with implant of intraocular lens to left eye.   SURGEON: Birder Robson, MD.   ANESTHESIA:  1. Managed anesthesia care.  2. Topical tetracaine drops followed by 2% Xylocaine jelly applied in the preoperative holding area.   COMPLICATIONS: None.   TECHNIQUE:  Stop and chop.   DESCRIPTION OF PROCEDURE: The patient was examined and consented in the preoperative holding area where the aforementioned topical anesthesia was applied to the left eye and then brought back to the Operating Room where the left eye was prepped and draped in the usual sterile ophthalmic fashion and a lid speculum was placed. A paracentesis was created with the side port blade and the anterior chamber was filled with viscoelastic. A near clear corneal incision was performed with the steel keratome. A continuous curvilinear capsulorrhexis was performed with a cystotome followed by the capsulorrhexis forceps. Hydrodissection and hydrodelineation were carried out with BSS on a blunt cannula. The lens was removed in a stop and chop technique and the remaining cortical material was removed with the irrigation-aspiration handpiece. The capsular bag was inflated with viscoelastic and the Technis ZCB00 24.5-diopter lens, serial number [id number] was placed in the capsular bag without complication. The remaining viscoelastic was removed from the eye with the irrigation-aspiration handpiece. The wounds were hydrated. The anterior chamber was flushed with Miostat and the eye was inflated to physiologic pressure. The wounds were found to be water tight. The eye was dressed with Vigamox. The patient was given protective  glasses to wear throughout the day and a shield with which to sleep tonight. The patient was also given drops with which to begin a drop regimen today and will follow-up with me in one day.   ____________________________ Livingston Diones. Hadyn Blanck, MD wlp:cms D: 05/01/2012 11:53:04 ET T: 05/01/2012 12:32:05 ET JOB#: [id number]  cc: Faustino Luecke L. Immaculate Crutcher, MD, <Dictator> Livingston Diones Laurann Mcmorris MD ELECTRONICALLY SIGNED 05/02/2012 13:38

## 2015-04-05 NOTE — Op Note (Signed)
PATIENT NAME:  Jasmine Davidson, Jasmine Davidson MR#:  [id number] DATE OF BIRTH:  [birth year]  DATE OF PROCEDURE:  01/31/2012  PREOPERATIVE DIAGNOSIS: Visually significant cataract of the right eye.   POSTOPERATIVE DIAGNOSIS: Visually significant cataract of the right eye.   OPERATIVE PROCEDURE: Cataract extraction by phacoemulsification with implant of intraocular lens to right eye.   SURGEON: Birder Robson, MD.   ANESTHESIA:  1. Managed anesthesia care.  2. Topical tetracaine drops followed by 2% Xylocaine jelly applied in the preoperative holding area.   COMPLICATIONS: None.   TECHNIQUE:  Stop-and-chop    DESCRIPTION OF PROCEDURE: The patient was examined and consented in the preoperative holding area where the aforementioned topical anesthesia was applied to the right eye and then brought back to the Operating Room where the right eye was prepped and draped in the usual sterile ophthalmic fashion and a lid speculum was placed. A paracentesis was created with the side port blade and the anterior chamber was filled with viscoelastic. A near clear corneal incision was performed with the steel keratome. A continuous curvilinear capsulorrhexis was performed with a cystotome followed by the capsulorrhexis forceps. Hydrodissection and hydrodelineation were carried out with BSS on a blunt cannula. The lens was removed in a stop-and-chop technique and the remaining cortical material was removed with the irrigation-aspiration handpiece. The capsular bag was inflated with viscoelastic and the Technus ZCB00 25.0-diopter lens, serial number [id number] was placed in the capsular bag without complication. The remaining viscoelastic was removed from the eye with the irrigation-aspiration handpiece. The wounds were hydrated. The anterior chamber was flushed with Miostat and the eye was inflated to physiologic pressure. The wounds were found to be water tight. The eye was dressed with Vigamox and Omnipred. The patient was given  protective glasses to wear throughout the day and a shield with which to sleep tonight. The patient was also given drops with which to begin a drop regimen today and will follow-up with me in one day.   ____________________________ Livingston Diones. Fletcher Ostermiller, MD wlp:drc D: 01/31/2012 12:22:28 ET T: 01/31/2012 12:46:22 ET JOB#: [id number]  cc: Mai Longnecker L. Jerzy Crotteau, MD, <Dictator> Livingston Diones Remmie Bembenek MD ELECTRONICALLY SIGNED 02/03/2012 15:32

## 2015-04-06 LAB — SURGICAL PATHOLOGY

## 2015-05-08 ENCOUNTER — Encounter: Payer: Self-pay | Admitting: Cardiovascular Disease

## 2015-05-19 ENCOUNTER — Other Ambulatory Visit: Payer: Self-pay | Admitting: *Deleted

## 2015-05-19 DIAGNOSIS — R920 Mammographic microcalcification found on diagnostic imaging of breast: Secondary | ICD-10-CM

## 2015-06-18 ENCOUNTER — Ambulatory Visit: Payer: Self-pay

## 2015-06-18 ENCOUNTER — Telehealth: Payer: Self-pay | Admitting: *Deleted

## 2015-06-18 ENCOUNTER — Ambulatory Visit
Admission: RE | Admit: 2015-06-18 | Discharge: 2015-06-18 | Disposition: A | Discharge status: Home / Self Care | Payer: Self-pay | Source: Ambulatory Visit | Attending: Oncology | Admitting: Oncology

## 2015-06-18 DIAGNOSIS — R920 Mammographic microcalcification found on diagnostic imaging of breast: Secondary | ICD-10-CM

## 2015-06-18 NOTE — Telephone Encounter (Signed)
Called patient and discussed results of her birads 3 mammogram.  Patient scheduled to return to [facility] clinic for annual screening and diagnostic mammogram on 12/09/15 @ 8:00.

## 2015-12-09 ENCOUNTER — Ambulatory Visit: Payer: Self-pay

## 2015-12-09 ENCOUNTER — Ambulatory Visit
Admission: RE | Admit: 2015-12-09 | Discharge: 2015-12-09 | Disposition: A | Discharge status: Home / Self Care | Payer: Self-pay | Source: Ambulatory Visit | Attending: Oncology | Admitting: Oncology

## 2015-12-09 ENCOUNTER — Ambulatory Visit: Payer: Self-pay | Attending: Oncology | Admitting: *Deleted

## 2015-12-09 ENCOUNTER — Encounter: Payer: Self-pay | Admitting: *Deleted

## 2015-12-09 ENCOUNTER — Other Ambulatory Visit: Payer: Self-pay | Admitting: *Deleted

## 2015-12-09 VITALS — BP 140/72 | HR 64 | Temp 97.8°F | Resp 18 | Ht 67.72 in | Wt 272.7 lb

## 2015-12-09 DIAGNOSIS — R920 Mammographic microcalcification found on diagnostic imaging of breast: Secondary | ICD-10-CM

## 2015-12-09 NOTE — Progress Notes (Signed)
Subjective:     Patient ID: Jasmine Davidson, female   DOB: 1956-02-02, 59 y.o.   MRN: [id number]  HPI   Review of Systems     Objective:   Physical Exam  Pulmonary/Chest: Right breast exhibits no inverted nipple, no mass, no nipple discharge, no skin change and no tenderness. Left breast exhibits no inverted nipple, no mass, no nipple discharge, no skin change and no tenderness. Breasts are symmetrical.       Assessment:     59 year old White female returns to [facility] for annual screening and 6 month follow-up mammogram.  Benign right breast biopsy in December 2015, with birads 3 mammogram in June.  Clinical breast exam unremarkable.  Taught self breast awareness.  Patient has been screened for eligibility.  She does not have any insurance, Medicare or Medicaid.  She also meets financial eligibility.  Hand-out given on the Affordable Care Act.     Plan:     Bilateral diagnostic mammogram and ultrasound ordered.  Will follow-up per [facility] protocol.

## 2015-12-09 NOTE — Patient Instructions (Signed)
Gave patient hand-out, Women Staying Healthy, Active and Well from [facility], with education on breast health, pap smears, heart and colon health. 

## 2015-12-09 NOTE — Progress Notes (Signed)
Patient here today for 6 month f/u.  Last mammogram 12-09-14.  Sterotactic Bx. 12-2014  Last pap smear 11-26-14.  Patient offers no concerns today.

## 2015-12-15 ENCOUNTER — Encounter: Payer: Self-pay | Admitting: *Deleted

## 2015-12-15 NOTE — Progress Notes (Signed)
Letter mailed from the [facility] to inform patient of her normal mammogram results.  Patient is to follow-up with annual screening in one year.  [facility] to [facility]. 

## 2016-11-21 ENCOUNTER — Encounter (INDEPENDENT_AMBULATORY_CARE_PROVIDER_SITE_OTHER): Payer: Self-pay

## 2016-11-21 ENCOUNTER — Ambulatory Visit
Admission: RE | Admit: 2016-11-21 | Discharge: 2016-11-21 | Disposition: A | Discharge status: Home / Self Care | Payer: Self-pay | Source: Ambulatory Visit | Attending: Oncology | Admitting: Oncology

## 2016-11-21 ENCOUNTER — Ambulatory Visit: Payer: Self-pay | Attending: Oncology

## 2016-11-21 VITALS — BP 192/78 | HR 63 | Temp 97.6°F | Ht 66.14 in | Wt 269.6 lb

## 2016-11-21 DIAGNOSIS — Z0000 Encounter for general adult medical examination without abnormal findings: Secondary | ICD-10-CM

## 2016-11-21 NOTE — Progress Notes (Signed)
Letter mailed from [facility] to notify of normal mammogram results.  Patient to return in one year for annual screening.  Copy to [facility]. 

## 2016-11-21 NOTE — Progress Notes (Signed)
Subjective:     Patient ID: Jasmine Davidson, female   DOB: 1956-07-11, 60 y.o.   MRN: [id number]  HPI   Review of Systems     Objective:   Physical Exam  Pulmonary/Chest: Right breast exhibits no inverted nipple, no mass, no nipple discharge, no skin change and no tenderness. Left breast exhibits no inverted nipple, no mass, no nipple discharge, no skin change and no tenderness. Breasts are symmetrical.       Assessment:     60 year old female returns for [facility] screening.  Patient screened, and meets [facility] eligibility.  Patient does not have insurance, Medicare or Medicaid.  Handout given on Affordable Care Act. Instructed patient on breast self-exam using teach back method.  CBE unremarkable.  No mass or lump palpated.      Plan:     Sent for bilateral screening mammogram.

## 2017-08-08 ENCOUNTER — Emergency Department: Payer: Self-pay

## 2017-08-08 ENCOUNTER — Inpatient Hospital Stay: Payer: Self-pay

## 2017-08-08 ENCOUNTER — Inpatient Hospital Stay
Admission: EM | Admit: 2017-08-08 | Discharge: 2017-08-10 | DRG: 638 | Disposition: A | Discharge status: Home / Self Care | Payer: Self-pay | Attending: Internal Medicine | Admitting: Internal Medicine

## 2017-08-08 ENCOUNTER — Encounter: Payer: Self-pay | Admitting: Emergency Medicine

## 2017-08-08 DIAGNOSIS — E875 Hyperkalemia: Secondary | ICD-10-CM | POA: Diagnosis present

## 2017-08-08 DIAGNOSIS — E860 Dehydration: Secondary | ICD-10-CM | POA: Diagnosis present

## 2017-08-08 DIAGNOSIS — I10 Essential (primary) hypertension: Secondary | ICD-10-CM | POA: Diagnosis present

## 2017-08-08 DIAGNOSIS — E11628 Type 2 diabetes mellitus with other skin complications: Principal | ICD-10-CM | POA: Diagnosis present

## 2017-08-08 DIAGNOSIS — L0390 Cellulitis, unspecified: Secondary | ICD-10-CM | POA: Diagnosis present

## 2017-08-08 DIAGNOSIS — N179 Acute kidney failure, unspecified: Secondary | ICD-10-CM | POA: Diagnosis present

## 2017-08-08 DIAGNOSIS — Z6841 Body Mass Index (BMI) 40.0 and over, adult: Secondary | ICD-10-CM

## 2017-08-08 DIAGNOSIS — E782 Mixed hyperlipidemia: Secondary | ICD-10-CM | POA: Diagnosis present

## 2017-08-08 DIAGNOSIS — Z888 Allergy status to other drugs, medicaments and biological substances status: Secondary | ICD-10-CM

## 2017-08-08 DIAGNOSIS — Z794 Long term (current) use of insulin: Secondary | ICD-10-CM

## 2017-08-08 DIAGNOSIS — L03116 Cellulitis of left lower limb: Secondary | ICD-10-CM | POA: Diagnosis present

## 2017-08-08 DIAGNOSIS — Z7982 Long term (current) use of aspirin: Secondary | ICD-10-CM

## 2017-08-08 DIAGNOSIS — Z79899 Other long term (current) drug therapy: Secondary | ICD-10-CM

## 2017-08-08 DIAGNOSIS — T370X5A Adverse effect of sulfonamides, initial encounter: Secondary | ICD-10-CM | POA: Diagnosis present

## 2017-08-08 DIAGNOSIS — I2510 Atherosclerotic heart disease of native coronary artery without angina pectoris: Secondary | ICD-10-CM | POA: Diagnosis present

## 2017-08-08 LAB — CBC
HCT: 33.6 % — ABNORMAL LOW (ref 35.0–47.0)
Hemoglobin: 11.3 g/dL — ABNORMAL LOW (ref 12.0–16.0)
MCH: 30.9 pg (ref 26.0–34.0)
MCHC: 33.5 g/dL (ref 32.0–36.0)
MCV: 92.2 fL (ref 80.0–100.0)
Platelets: 151 10*3/uL (ref 150–440)
RBC: 3.64 MIL/uL — ABNORMAL LOW (ref 3.80–5.20)
RDW: 14.1 % (ref 11.5–14.5)
WBC: 5.3 10*3/uL (ref 3.6–11.0)

## 2017-08-08 LAB — COMPREHENSIVE METABOLIC PANEL
ALT: 18 U/L (ref 14–54)
AST: 22 U/L (ref 15–41)
Albumin: 3.8 g/dL (ref 3.5–5.0)
Alkaline Phosphatase: 95 U/L (ref 38–126)
Anion gap: 6 (ref 5–15)
BUN: 32 mg/dL — ABNORMAL HIGH (ref 6–20)
CO2: 26 mmol/L (ref 22–32)
Calcium: 9.1 mg/dL (ref 8.9–10.3)
Chloride: 105 mmol/L (ref 101–111)
Creatinine, Ser: 1.86 mg/dL — ABNORMAL HIGH (ref 0.44–1.00)
GFR calc Af Amer: 33 mL/min — ABNORMAL LOW (ref >60)
GFR calc non Af Amer: 28 mL/min — ABNORMAL LOW (ref >60)
Glucose, Bld: 86 mg/dL (ref 65–99)
Potassium: 5.7 mmol/L — ABNORMAL HIGH (ref 3.5–5.1)
Sodium: 137 mmol/L (ref 135–145)
Total Bilirubin: 0.5 mg/dL (ref 0.3–1.2)
Total Protein: 6.9 g/dL (ref 6.5–8.1)

## 2017-08-08 LAB — GLUCOSE, CAPILLARY: Glucose-Capillary: 74 mg/dL (ref 65–99)

## 2017-08-08 MED ORDER — ACETAMINOPHEN 650 MG RE SUPP
650.0000 mg | Freq: Four times a day (QID) | RECTAL | Status: DC | PRN
Start: 2017-08-08 — End: 2017-08-10

## 2017-08-08 MED ORDER — ATORVASTATIN CALCIUM 20 MG PO TABS
40.0000 mg | ORAL_TABLET | Freq: Every day | ORAL | Status: DC
  Administered 2017-08-08 – 2017-08-09 (×2): 40 mg via ORAL
  Filled 2017-08-08 (×2): qty 2

## 2017-08-08 MED ORDER — SODIUM POLYSTYRENE SULFONATE 15 GM/60ML PO SUSP
30.0000 g | Freq: Once | ORAL | Status: AC
Start: 2017-08-08 — End: 2017-08-08
  Administered 2017-08-08: 30 g via ORAL
  Filled 2017-08-08: qty 120

## 2017-08-08 MED ORDER — INSULIN ASPART PROT & ASPART (70-30 MIX) 100 UNIT/ML ~~LOC~~ SUSP
15.0000 [IU] | Freq: Every day | SUBCUTANEOUS | Status: DC
  Administered 2017-08-09 – 2017-08-10 (×2): 15 [IU] via SUBCUTANEOUS
  Filled 2017-08-08 (×2): qty 10

## 2017-08-08 MED ORDER — METOPROLOL SUCCINATE ER 50 MG PO TB24
50.0000 mg | ORAL_TABLET | Freq: Every day | ORAL | Status: DC
  Administered 2017-08-09 – 2017-08-10 (×2): 50 mg via ORAL
  Filled 2017-08-08 (×2): qty 1

## 2017-08-08 MED ORDER — GABAPENTIN 300 MG PO CAPS
300.0000 mg | ORAL_CAPSULE | Freq: Two times a day (BID) | ORAL | Status: DC
  Administered 2017-08-08 – 2017-08-10 (×4): 300 mg via ORAL
  Filled 2017-08-08 (×4): qty 1

## 2017-08-08 MED ORDER — SODIUM CHLORIDE 0.9 % IV BOLUS (SEPSIS)
1000.0000 mL | Freq: Once | INTRAVENOUS | Status: AC
  Administered 2017-08-08: 1000 mL via INTRAVENOUS

## 2017-08-08 MED ORDER — ACETAMINOPHEN 325 MG PO TABS
650.0000 mg | ORAL_TABLET | Freq: Four times a day (QID) | ORAL | Status: DC | PRN
  Administered 2017-08-09 (×2): 650 mg via ORAL
  Filled 2017-08-08 (×2): qty 2

## 2017-08-08 MED ORDER — SODIUM CHLORIDE 0.9 % IV SOLN
1250.0000 mg | INTRAVENOUS | Status: DC
  Administered 2017-08-09 – 2017-08-10 (×2): 1250 mg via INTRAVENOUS
  Filled 2017-08-08 (×2): qty 1250

## 2017-08-08 MED ORDER — INSULIN ASPART PROT & ASPART (70-30 MIX) 100 UNIT/ML ~~LOC~~ SUSP
10.0000 [IU] | Freq: Every day | SUBCUTANEOUS | Status: DC
  Administered 2017-08-09: 10 [IU] via SUBCUTANEOUS
  Filled 2017-08-08: qty 10

## 2017-08-08 MED ORDER — SODIUM CHLORIDE 0.9 % IV SOLN
INTRAVENOUS | Status: DC
  Administered 2017-08-08: 22:00:00 via INTRAVENOUS

## 2017-08-08 MED ORDER — VANCOMYCIN HCL IN DEXTROSE 1-5 GM/200ML-% IV SOLN: 1000.0000 mg | Freq: Once | INTRAVENOUS | Status: DC

## 2017-08-08 MED ORDER — ONDANSETRON HCL 4 MG PO TABS: 4.0000 mg | ORAL_TABLET | Freq: Four times a day (QID) | ORAL | Status: DC | PRN

## 2017-08-08 MED ORDER — VANCOMYCIN HCL 10 G IV SOLR
1500.0000 mg | Freq: Once | INTRAVENOUS | Status: AC
  Administered 2017-08-08: 1500 mg via INTRAVENOUS
  Filled 2017-08-08: qty 1500

## 2017-08-08 MED ORDER — ENOXAPARIN SODIUM 40 MG/0.4ML ~~LOC~~ SOLN
40.0000 mg | Freq: Two times a day (BID) | SUBCUTANEOUS | Status: DC
  Administered 2017-08-09 – 2017-08-10 (×4): 40 mg via SUBCUTANEOUS
  Filled 2017-08-08 (×4): qty 0.4

## 2017-08-08 MED ORDER — INSULIN ASPART 100 UNIT/ML ~~LOC~~ SOLN
0.0000 [IU] | Freq: Three times a day (TID) | SUBCUTANEOUS | Status: DC
  Administered 2017-08-09: 18:00:00 2 [IU] via SUBCUTANEOUS
  Administered 2017-08-09: 12:00:00 1 [IU] via SUBCUTANEOUS
  Administered 2017-08-10: 12:00:00 5 [IU] via SUBCUTANEOUS
  Filled 2017-08-08 (×3): qty 1

## 2017-08-08 MED ORDER — ONDANSETRON HCL 4 MG/2ML IJ SOLN: 4.0000 mg | Freq: Four times a day (QID) | INTRAMUSCULAR | Status: DC | PRN

## 2017-08-08 MED ORDER — ASPIRIN EC 81 MG PO TBEC
81.0000 mg | DELAYED_RELEASE_TABLET | Freq: Every day | ORAL | Status: DC
  Administered 2017-08-09 – 2017-08-10 (×2): 81 mg via ORAL
  Filled 2017-08-08 (×2): qty 1

## 2017-08-08 MED ORDER — INSULIN ASPART 100 UNIT/ML ~~LOC~~ SOLN: 0.0000 [IU] | Freq: Every day | SUBCUTANEOUS | Status: DC

## 2017-08-08 MED ORDER — POLYETHYLENE GLYCOL 3350 17 G PO PACK: 17.0000 g | PACK | Freq: Every day | ORAL | Status: DC | PRN

## 2017-08-08 NOTE — ED Provider Notes (Signed)
[facility] Emergency Department Provider Note  ____________________________________________  Time seen: Approximately 5:41 PM  I have reviewed the triage vital signs and the nursing notes.   HISTORY  Chief Complaint Leg Swelling    HPI Jasmine Davidson is a 61 y.o. female who complains of pain and redness warmth and swelling in the left leg for the past couple weeks. She seen her primary care doctor who did a course of Bactrim without improvement and then started her on penicillin again without improvement.Patient denies any trauma or new wounds. No fevers chills sweats chest pain shortness of breath or syncope. Pain in the area is worse with weightbearing, alleviated with rest and elevation. Moderate intensity aching.     Past Medical History:  Diagnosis Date  . CAD (coronary artery disease)   . Diabetes mellitus   . HTN (hypertension)   . Obesity      Patient Active Problem List   Diagnosis Date Noted  . DM 11/11/2010  . HYPERLIPIDEMIA-MIXED 11/11/2010  . HYPERTENSION, BENIGN 11/11/2010  . CAD, NATIVE VESSEL 11/11/2010  . 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 CAROTID ARTERY STENOSIS, WITHOUT INFARCTION 11/11/2010  . SYNCOPE AND COLLAPSE 11/11/2010     Past Surgical History:  Procedure Laterality Date  . BLADDER REPAIR W/ CESAREAN SECTION  1981  . BREAST BIOPSY Right 12/17/2014   negative stereotactic  . CARDIAC CATHETERIZATION    . cyst taken off finger  2011  . TOE SURGERY  02/08/2013     Prior to Admission medications   Medication Sig Start Date End Date Taking? Authorizing Provider  aspirin (ASPIR-81) 81 MG EC tablet Take 81 mg by mouth daily.      [provider]  atorvastatin (LIPITOR) 40 MG tablet TAKE ONE TABLET BY MOUTH EVERY DAY 02/13/14   Minna Merritts, MD  Gabapentin, Once-Daily, 300 MG TABS Take 1 tablet by mouth 3 (three) times daily.    [provider]  insulin NPH-insulin regular (HUMULIN 70/30)  (70-30) 100 UNIT/ML injection Inject into the skin as directed.      [provider]  lisinopril-hydrochlorothiazide (PRINZIDE,ZESTORETIC) 20-12.5 MG per tablet TAKE TWO TABLETS BY MOUTH ONCE DAILY 02/13/14   Minna Merritts, MD  metoprolol succinate (TOPROL-XL) 50 MG 24 hr tablet TAKE ONE TABLET BY MOUTH EVERY DAY 09/14/13   Minna Merritts, MD  metoprolol succinate (TOPROL-XL) 50 MG 24 hr tablet Take 1 tablet (50 mg total) by mouth daily. Patient not taking: Reported on 12/09/2015 09/16/13   Minna Merritts, MD  sitaGLIPtan-metformin (JANUMET) 50-1000 MG per tablet Take 1 tablet by mouth 2 (two) times daily.      [provider]     Allergies Pregabalin   Family History  Problem Relation Age of Onset  . Diabetes Father   . Heart failure Father        congestive  . Hypertension Father   . Lupus Father   . COPD Mother     Social History Social History  Substance Use Topics  . Smoking status: Never Smoker  . Smokeless tobacco: Never Used     Comment: tobacco use- no   . Alcohol use No    Review of Systems  Constitutional:   No fever or chills.  ENT:   No sore throat. No rhinorrhea. Cardiovascular:   No chest pain or syncope. Respiratory:   No dyspnea or cough. Gastrointestinal:   Negative for abdominal pain, vomiting and diarrhea.  Musculoskeletal:   Left leg  pain and swelling as above All other systems reviewed and are negative except as documented above in ROS and HPI.  ____________________________________________   PHYSICAL EXAM:  VITAL SIGNS: ED Triage Vitals  Enc Vitals Group     BP 08/08/17 1326 (!) 155/55     Pulse Rate 08/08/17 1326 65     Resp 08/08/17 1326 18     Temp 08/08/17 1326 98.2 F (36.8 C)     Temp Source 08/08/17 1326 Oral     SpO2 08/08/17 1326 100 %     Weight 08/08/17 1327 269 lb (122 kg)     Height --      Head Circumference --      Peak Flow --      Pain Score 08/08/17 1326 4     Pain Loc --      Pain Edu? --       Excl. in [place]? --     Vital signs reviewed, nursing assessments reviewed.   Constitutional:   Alert and oriented. Not in distresss. Eyes:   No scleral icterus.  EOMI. No nystagmus. No conjunctival pallor. PERRL. ENT   Head:   Normocephalic and atraumatic.   Nose:   No congestion/rhinnorhea.    Mouth/Throat:   MMM, no pharyngeal erythema. No peritonsillar mass.    Neck:   No meningismus. Full ROM Hematological/Lymphatic/Immunilogical:   No cervical lymphadenopathy. Cardiovascular:   RRR. Symmetric bilateral radial and DP pulses.  No murmurs.  Respiratory:   Normal respiratory effort without tachypnea/retractions. Breath sounds are clear and equal bilaterally. No wheezes/rales/rhonchi. Gastrointestinal:   Soft and nontender. Non distended. There is no CVA tenderness.  No rebound, rigidity, or guarding. Genitourinary:   deferred Musculoskeletal:   Normal range of motion in all extremities. No joint effusions.  Area of erythemaover the left shin with tenderness, warmth, swelling and increased calf circumference.No palpable cords. Negative Homan sign. Neurologic:   Normal speech and language.  Motor grossly intact. No gross focal neurologic deficits are appreciated.  Skin:    Skin is warm, dry and intact. No rash noted.  No petechiae, purpura, or bullae.  ____________________________________________    LABS (pertinent positives/negatives) (all labs ordered are listed, but only abnormal results are displayed) Labs Reviewed  COMPREHENSIVE METABOLIC PANEL - Abnormal; Notable for the following:       Result Value   Potassium 5.7 (*)    BUN 32 (*)    Creatinine, Ser 1.86 (*)    GFR calc non Af Amer 28 (*)    GFR calc Af Amer 33 (*)    All other components within normal limits  CBC - Abnormal; Notable for the following:    RBC 3.64 (*)    Hemoglobin 11.3 (*)    HCT 33.6 (*)    All other components within normal limits    ____________________________________________   EKG    ____________________________________________    RADIOLOGY  Dg Tibia/fibula Left  Result Date: 08/08/2017 CLINICAL DATA:  Swelling, redness and warmth 2 distal left tibia and fibula for 2 weeks. EXAM: LEFT TIBIA AND FIBULA - 2 VIEW COMPARISON:  None. FINDINGS: Mild diffuse soft tissue swelling or edema within the calf. No bony abnormality. No fracture, subluxation or dislocation. IMPRESSION: No acute bony abnormality. Electronically Signed   By: Rolm Baptise M.D.   On: 08/08/2017 14:02   US Venous Img Lower Unilateral Left  Result Date: 08/08/2017 CLINICAL DATA:  Pain, erythema and swelling of the left lower extremity. EXAM: left LOWER EXTREMITY  VENOUS DOPPLER ULTRASOUND TECHNIQUE: Gray-scale sonography with graded compression, as well as color Doppler and duplex ultrasound were performed to evaluate the lower extremity deep venous systems from the level of the common femoral vein and including the common femoral, femoral, profunda femoral, popliteal and calf veins including the posterior tibial, peroneal and gastrocnemius veins when visible. The superficial great saphenous vein was also interrogated. Spectral Doppler was utilized to evaluate flow at rest and with distal augmentation maneuvers in the common femoral, femoral and popliteal veins. COMPARISON:  None. FINDINGS: Contralateral Common Femoral Vein: Respiratory phasicity is normal and symmetric with the symptomatic side. No evidence of thrombus. Normal compressibility. Common Femoral Vein: No evidence of thrombus. Normal compressibility, respiratory phasicity and response to augmentation. Saphenofemoral Junction: No evidence of thrombus. Normal compressibility and flow on color Doppler imaging. Profunda Femoral Vein: No evidence of thrombus. Normal compressibility and flow on color Doppler imaging. Femoral Vein: No evidence of thrombus. Normal compressibility, respiratory phasicity  and response to augmentation. Popliteal Vein: No evidence of thrombus. Normal compressibility, respiratory phasicity and response to augmentation. Calf Veins: No evidence of thrombus. Normal compressibility and flow on color Doppler imaging. Superficial Great Saphenous Vein: No evidence of thrombus. Normal compressibility and flow on color Doppler imaging. Venous Reflux:  None. Other Findings:  None. IMPRESSION: No evidence of DVT within the left lower extremity. Electronically Signed   By: Abelardo Diesel M.D.   On: 08/08/2017 16:48    ____________________________________________   PROCEDURES Procedures  ____________________________________________   INITIAL IMPRESSION / ASSESSMENT AND PLAN / ED COURSE  Pertinent labs & imaging results that were available during my care of the patient were reviewed by me and considered in my medical decision making (see chart for details).  Patient presents with inflammation and swelling of the left leg, likely cellulitis failed outpatient treatment. With the asymmetric swelling, ultrasound was performed which was negative for DVT. Patient is not septic, but with her diabetes and refractory cellulitis, I started her on IV vancomycin and discussed the case with the hospitalist for admission. Presentation is not consistent with abscess or necrotizing fasciitis. I doubt osteomyelitis at this time      ____________________________________________   FINAL CLINICAL IMPRESSION(S) / ED DIAGNOSES  Final diagnoses:  Cellulitis of left lower extremity      New Prescriptions   No medications on file     Portions of this note were generated with dragon dictation software. Dictation errors may occur despite best attempts at proofreading.    Carrie Mew, MD 08/08/17 [phone]

## 2017-08-08 NOTE — Progress Notes (Signed)
Pharmacy Antibiotic Note  Jasmine Davidson is a 61 y.o. female admitted on 08/08/2017 with cellulitis.  Pharmacy has been consulted for vancomycin dosing.  Plan: Vancomycin 1.5 gm IV x 1 in ED followed in approximately 15 hours (stacked dosing) by vancomycin 1.25 gm IV Q24H predicted trough 15 mcg/mL. Pharmacy will continue to follow and adjust as needed to maintain trough 10 to 15 mcg/mL.   Vd 57.9 L, Ke 0.039 hr-1, T1/2 17.7 hr  Weight: 269 lb (122 kg)  Temp (24hrs), Avg:97.9 F (36.6 C), Min:97.6 F (36.4 C), Max:98.2 F (36.8 C)   Recent Labs Lab 08/08/17 1342  WBC 5.3  CREATININE 1.86*    CrCl cannot be calculated (Unknown ideal weight.).    Allergies  Allergen Reactions  . Pregabalin     Thank you for allowing pharmacy to be a part of this patient's care.  Laural Benes, Pharm.D., BCPS Clinical Pharmacist  08/08/2017 9:50 PM

## 2017-08-08 NOTE — ED Triage Notes (Signed)
Pt to ed with c/o swelling, redness and warmth to left lower extremity x several weeks.  Pt on PO abx at this time.

## 2017-08-08 NOTE — ED Notes (Signed)
Attempted to call report Rn is in a room will call me back

## 2017-08-08 NOTE — ED Notes (Signed)
Patient transported to Ultrasound 

## 2017-08-08 NOTE — ED Notes (Signed)
Patients bed assignment has went back to "[facility] Plan"

## 2017-08-08 NOTE — H&P (Signed)
[facility] NAME: Jasmine Davidson    MR#:  [id number]  DATE OF BIRTH:  10-16-1956  DATE OF ADMISSION:  8/28/2018Patient  PRIMARY CARE PHYSICIAN: Theotis Burrow, MD   REQUESTING/REFERRING PHYSICIAN: Joni Fears  CHIEF COMPLAINT:  Left leg swelling  HISTORY OF PRESENT ILLNESS:  Jasmine Davidson  is a 61 y.o. female with a known history of insulin requiring diabetes mother, hypertension, coronary artery disease and obesity is presenting to the ED with a chief complaint of left leg swelling and pain associated with redness for couple of weeks. Patient was seen by primary care physician and initially completed a course of Bactrim following that penicillin course with no improvement.patient came into the emergency department as her pain and redness are getting worse in the left leg. Venous Dopplers are negative DVT. Patient is started on IV vancomycin and hospitalist team is called to admit the patient. Potassium is elevated at 5.7  PAST MEDICAL HISTORY:   Past Medical History:  Diagnosis Date  . CAD (coronary artery disease)   . Diabetes mellitus   . HTN (hypertension)   . Obesity     PAST SURGICAL HISTOIRY:   Past Surgical History:  Procedure Laterality Date  . BLADDER REPAIR W/ CESAREAN SECTION  1981  . BREAST BIOPSY Right 12/17/2014   negative stereotactic  . CARDIAC CATHETERIZATION    . cyst taken off finger  2011  . TOE SURGERY  02/08/2013    SOCIAL HISTORY:   Social History  Substance Use Topics  . Smoking status: Never Smoker  . Smokeless tobacco: Never Used     Comment: tobacco use- no   . Alcohol use No    FAMILY HISTORY:   Family History  Problem Relation Age of Onset  . Diabetes Father   . Heart failure Father        congestive  . Hypertension Father   . Lupus Father   . COPD Mother     DRUG ALLERGIES:   Allergies  Allergen Reactions  . Pregabalin     REVIEW OF SYSTEMS:   CONSTITUTIONAL: No fever, fatigue or weakness.  EYES: No blurred or double vision.  EARS, NOSE, AND THROAT: No tinnitus or ear pain.  RESPIRATORY: No cough, shortness of breath, wheezing or hemoptysis.  CARDIOVASCULAR: No chest pain, orthopnea, edema.  GASTROINTESTINAL: No nausea, vomiting, diarrhea or abdominal pain.  GENITOURINARY: No dysuria, hematuria.  ENDOCRINE: No polyuria, nocturia,  HEMATOLOGY: No anemia, easy bruising or bleeding SKIN: No rash or lesion. MUSCULOSKELETAL: left lower extremity is erythematous, painful, no purulent discharge No joint pain or arthritis.   NEUROLOGIC: No tingling, numbness, weakness.  PSYCHIATRY: No anxiety or depression.   MEDICATIONS AT HOME:   Prior to Admission medications   Medication Sig Start Date End Date Taking? Authorizing Provider  aspirin (ASPIR-81) 81 MG EC tablet Take 81 mg by mouth daily.     Yes [provider]  atorvastatin (LIPITOR) 40 MG tablet TAKE ONE TABLET BY MOUTH EVERY DAY Patient taking differently: Take 40 mg by mouth at bedtime.  02/13/14  Yes Minna Merritts, MD  gabapentin (NEURONTIN) 300 MG capsule Take 300 mg by mouth 2 (two) times daily.   Yes [provider]  insulin NPH-insulin regular (HUMULIN 70/30) (70-30) 100 UNIT/ML injection Inject 10-15 Units into the skin 2 (two) times daily. 15 units every morning and 10 units every evening   Yes [provider]  lisinopril-hydrochlorothiazide (PRINZIDE,ZESTORETIC) 20-12.5 MG per  tablet TAKE TWO TABLETS BY MOUTH ONCE DAILY Patient taking differently: Take 2 tablets by mouth daily.  02/13/14  Yes Minna Merritts, MD  metFORMIN (GLUCOPHAGE) 1000 MG tablet Take 1,000 mg by mouth 2 (two) times daily.   Yes [provider]  metoprolol succinate (TOPROL-XL) 50 MG 24 hr tablet Take 1 tablet (50 mg total) by mouth daily. 09/16/13  Yes Gollan, Kathlene November, MD      VITAL SIGNS:  Blood pressure (!) 147/58, pulse (!) 59, temperature 98.2 F (36.8  C), temperature source Oral, resp. rate 18, weight 122 kg (269 lb), last menstrual period 09/12/1999, SpO2 100 %.  PHYSICAL EXAMINATION:  GENERAL:  61 y.o.-year-old patient lying in the bed with no acute distress.  EYES: Pupils equal, round, reactive to light and accommodation. No scleral icterus. Extraocular muscles intact.  HEENT: Head atraumatic, normocephalic. Oropharynx and nasopharynx clear.  NECK:  Supple, no jugular venous distention. No thyroid enlargement, no tenderness.  LUNGS: Normal breath sounds bilaterally, no wheezing, rales,rhonchi or crepitation. No use of accessory muscles of respiration.  CARDIOVASCULAR: S1, S2 normal. No murmurs, rubs, or gallops.  ABDOMEN: Soft, nontender, nondistended. Bowel sounds present. No organomegaly or mass.  EXTREMITIES: left lower extremity is erythematous, edematous, tender but no purulent discharge or weeping noticed No pedal edema, cyanosis, or clubbing.  NEUROLOGIC: Cranial nerves II through XII are intact. Muscle strength 5/5 in all extremities. Sensation intact. Gait not checked.  PSYCHIATRIC: The patient is alert and oriented x 3.  SKIN: No obvious rash, lesion, or ulcer.   LABORATORY PANEL:   CBC  Recent Labs Lab 08/08/17 1342  WBC 5.3  HGB 11.3*  HCT 33.6*  PLT 151   ------------------------------------------------------------------------------------------------------------------  Chemistries   Recent Labs Lab 08/08/17 1342  NA 137  K 5.7*  CL 105  CO2 26  GLUCOSE 86  BUN 32*  CREATININE 1.86*  CALCIUM 9.1  AST 22  ALT 18  ALKPHOS 95  BILITOT 0.5   ------------------------------------------------------------------------------------------------------------------  Cardiac Enzymes No results for input(s): TROPONINI in the last 168 hours. ------------------------------------------------------------------------------------------------------------------  RADIOLOGY:  Dg Tibia/fibula Left  Result Date:  08/08/2017 CLINICAL DATA:  Swelling, redness and warmth 2 distal left tibia and fibula for 2 weeks. EXAM: LEFT TIBIA AND FIBULA - 2 VIEW COMPARISON:  None. FINDINGS: Mild diffuse soft tissue swelling or edema within the calf. No bony abnormality. No fracture, subluxation or dislocation. IMPRESSION: No acute bony abnormality. Electronically Signed   By: Rolm Baptise M.D.   On: 08/08/2017 14:02   US Venous Img Lower Unilateral Left  Result Date: 08/08/2017 CLINICAL DATA:  Pain, erythema and swelling of the left lower extremity. EXAM: left LOWER EXTREMITY VENOUS DOPPLER ULTRASOUND TECHNIQUE: Gray-scale sonography with graded compression, as well as color Doppler and duplex ultrasound were performed to evaluate the lower extremity deep venous systems from the level of the common femoral vein and including the common femoral, femoral, profunda femoral, popliteal and calf veins including the posterior tibial, peroneal and gastrocnemius veins when visible. The superficial great saphenous vein was also interrogated. Spectral Doppler was utilized to evaluate flow at rest and with distal augmentation maneuvers in the common femoral, femoral and popliteal veins. COMPARISON:  None. FINDINGS: Contralateral Common Femoral Vein: Respiratory phasicity is normal and symmetric with the symptomatic side. No evidence of thrombus. Normal compressibility. Common Femoral Vein: No evidence of thrombus. Normal compressibility, respiratory phasicity and response to augmentation. Saphenofemoral Junction: No evidence of thrombus. Normal compressibility and flow on color Doppler imaging. Profunda  Femoral Vein: No evidence of thrombus. Normal compressibility and flow on color Doppler imaging. Femoral Vein: No evidence of thrombus. Normal compressibility, respiratory phasicity and response to augmentation. Popliteal Vein: No evidence of thrombus. Normal compressibility, respiratory phasicity and response to augmentation. Calf Veins: No  evidence of thrombus. Normal compressibility and flow on color Doppler imaging. Superficial Great Saphenous Vein: No evidence of thrombus. Normal compressibility and flow on color Doppler imaging. Venous Reflux:  None. Other Findings:  None. IMPRESSION: No evidence of DVT within the left lower extremity. Electronically Signed   By: Abelardo Diesel M.D.   On: 08/08/2017 16:48    EKG:   Orders placed or performed in visit on 05/31/14  . EKG 12-Lead    IMPRESSION AND PLAN:   Taryn Shellhammer  is a 61 y.o. female with a known history of insulin requiring diabetes mother, hypertension, coronary artery disease and obesity is presenting to the ED with a chief complaint of left leg swelling and pain associated with redness for couple of weeks. Patient was seen by primary care physician and initially completed a course of Bactrim following that penicillin course with no improvement.patient came into the emergency department   #left lower extremity cellulitis Admit to MedSurg unit Patient failed outpatient antibiotics Bactrim and penicillin Start patient on IV vancomycin and pharmacy to dose DVT ruled out with negative venous Dopplers Pain management as needed  #Acute kidney injury-Prerenal IV fluids, monitor renal function Hold metformin Avoid nephrotoxins Renal ultrasound  #Hyperkalemia Kayexalate, IV fluids Repeat labs  #hypertension Hold home medication lisinopril hydrochlorothiazide in view of acute kidney injury Continue Toprol-XL titrate as needed   #hyperlipidemia continue statin   DVT prophylaxis with Lovenox subcutaneous renal dose adjusted   All the records are reviewed and case discussed with ED provider. Management plans discussed with the patient, family and they are in agreement.  CODE STATUS: FC,HUSBAND IS HEALTHCARE POWER OF ATTORNEY  TOTAL TIME TAKING CARE OF THIS PATIENT: 43 minutes.   Note: This dictation was prepared with Dragon dictation along with smaller  phrase technology. Any transcriptional errors that result from this process are unintentional.  Nicholes Mango M.D on 08/08/2017 at 6:38 PM  Between 7am to 6pm - Pager - [phone]  After 6pm go to [email or web address] - password [email or web address] [facility]  Office  [phone]  CC: Primary care physician; Theotis Burrow, MD

## 2017-08-08 NOTE — ED Triage Notes (Signed)
FIRST NURSE NOTE-SENT FOR possible IV abx r/t cellulitis

## 2017-08-09 LAB — BASIC METABOLIC PANEL
Anion gap: 5 (ref 5–15)
BUN: 23 mg/dL — ABNORMAL HIGH (ref 6–20)
CO2: 23 mmol/L (ref 22–32)
Calcium: 8.3 mg/dL — ABNORMAL LOW (ref 8.9–10.3)
Chloride: 111 mmol/L (ref 101–111)
Creatinine, Ser: 1.42 mg/dL — ABNORMAL HIGH (ref 0.44–1.00)
GFR calc Af Amer: 45 mL/min — ABNORMAL LOW (ref >60)
GFR calc non Af Amer: 39 mL/min — ABNORMAL LOW (ref >60)
Glucose, Bld: 116 mg/dL — ABNORMAL HIGH (ref 65–99)
Potassium: 4.9 mmol/L (ref 3.5–5.1)
Sodium: 139 mmol/L (ref 135–145)

## 2017-08-09 LAB — CBC
HCT: 30.7 % — ABNORMAL LOW (ref 35.0–47.0)
Hemoglobin: 10.2 g/dL — ABNORMAL LOW (ref 12.0–16.0)
MCH: 30.8 pg (ref 26.0–34.0)
MCHC: 33.3 g/dL (ref 32.0–36.0)
MCV: 92.5 fL (ref 80.0–100.0)
Platelets: 131 10*3/uL — ABNORMAL LOW (ref 150–440)
RBC: 3.32 MIL/uL — ABNORMAL LOW (ref 3.80–5.20)
RDW: 14.4 % (ref 11.5–14.5)
WBC: 3.9 10*3/uL (ref 3.6–11.0)

## 2017-08-09 LAB — HEMOGLOBIN A1C
Hgb A1c MFr Bld: 7 % — ABNORMAL HIGH (ref 4.8–5.6)
Mean Plasma Glucose: 154.2 mg/dL

## 2017-08-09 LAB — GLUCOSE, CAPILLARY
Glucose-Capillary: 99 mg/dL (ref 65–99)
Glucose-Capillary: 137 mg/dL — ABNORMAL HIGH (ref 65–99)
Glucose-Capillary: 190 mg/dL — ABNORMAL HIGH (ref 65–99)
Glucose-Capillary: 117 mg/dL — ABNORMAL HIGH (ref 65–99)

## 2017-08-09 NOTE — Progress Notes (Signed)
Anticoagulation monitoring(Lovenox):  61 yo female ordered Lovenox 30 mg Q24h  Filed Weights   08/08/17 1327 08/08/17 2107 08/08/17 2259  Weight: 269 lb (122 kg) 262 lb 4.8 oz (119 kg) 262 lb 14.4 oz (119.3 kg)   BMI 42.4   Lab Results  Component Value Date   CREATININE 1.86 (H) 08/08/2017   CREATININE 0.94 05/31/2014   CREATININE 0.70 11/16/2009   Estimated Creatinine Clearance: 42.3 mL/min (A) (by C-G formula based on SCr of 1.86 mg/dL (H)). Hemoglobin & Hematocrit     Component Value Date/Time   HGB 11.3 (L) 08/08/2017 1342   HGB 14.1 05/31/2014 1603   HCT 33.6 (L) 08/08/2017 1342   HCT 42.0 05/31/2014 1603     Per Protocol for Patient with estCrcl > 30 ml/min and BMI > 40, will transition to Lovenox 40 mg Q12h.

## 2017-08-09 NOTE — Consult Note (Signed)
[facility] Infectious Disease     Reason for Consult:leg cellulitis    Referring Physician: Gouru, A Date of Admission:  08/08/2017   Active Problems:   Cellulitis   HPI: Jasmine Davidson is a 61 y.o. female admitted with several weeks of L leg redness and pain. Was treated as otpt with bactrim and pcn but continued.  On admit no fever, wbc nml. [place] neg for DVT.  Xray neg except for mild soft tissue sweling and edema in calf.  Has hx of IDDM, CAD, obesity, HTN, venous stasis.   Body mass index is 42.43 kg/m.  Past Medical History:  Diagnosis Date  . CAD (coronary artery disease)   . Diabetes mellitus   . HTN (hypertension)   . Obesity    Past Surgical History:  Procedure Laterality Date  . BLADDER REPAIR W/ CESAREAN SECTION  1981  . BREAST BIOPSY Right 12/17/2014   negative stereotactic  . CARDIAC CATHETERIZATION    . cyst taken off finger  2011  . TOE SURGERY  02/08/2013   Social History  Substance Use Topics  . Smoking status: Never Smoker  . Smokeless tobacco: Never Used     Comment: tobacco use- no   . Alcohol use No   Family History  Problem Relation Age of Onset  . Diabetes Father   . Heart failure Father        congestive  . Hypertension Father   . Lupus Father   . COPD Mother     Allergies:  Allergies  Allergen Reactions  . Pregabalin     Current antibiotics: Antibiotics Given (last 72 hours)    Date/Time Action Medication Dose Rate   08/08/17 1532 New Bag/Given   vancomycin (VANCOCIN) 1,500 mg in sodium chloride 0.9 % 500 mL IVPB 1,500 mg 250 mL/hr   08/09/17 1043 New Bag/Given   vancomycin (VANCOCIN) 1,250 mg in sodium chloride 0.9 % 250 mL IVPB 1,250 mg 166.7 mL/hr      MEDICATIONS: . aspirin EC  81 mg Oral Daily  . atorvastatin  40 mg Oral QHS  . enoxaparin (LOVENOX) injection  40 mg Subcutaneous Q12H  . gabapentin  300 mg Oral BID  . insulin aspart  0-5 Units Subcutaneous QHS  . insulin aspart  0-9 Units Subcutaneous TID WC  . insulin  aspart protamine- aspart  10 Units Subcutaneous Q supper  . insulin aspart protamine- aspart  15 Units Subcutaneous Q breakfast  . metoprolol succinate  50 mg Oral Daily    Review of Systems - 11 systems reviewed and negative per HPI   OBJECTIVE: Temp:  [97.6 F (36.4 C)-98 F (36.7 C)] 98 F (36.7 C) (08/29 1345) Pulse Rate:  [58-63] 61 (08/29 1345) Resp:  [18-20] 18 (08/29 0803) BP: (117-158)/(41-70) 130/41 (08/29 1345) SpO2:  [99 %-100 %] 100 % (08/29 1345) Weight:  [119 kg (262 lb 4.8 oz)-119.3 kg (262 lb 14.4 oz)] 119.3 kg (262 lb 14.4 oz) (08/28 2259) Physical Exam  Constitutional:  oriented to person, place, and time. appears well-developed and well-nourished. No distress. obese HENT: [place]/AT, PERRLA, no scleral icterus Mouth/Throat: Oropharynx is clear and moist. No oropharyngeal exudate.  Cardiovascular: Normal rate, regular rhythm and normal heart sounds. Pulmonary/Chest: Effort normal and breath sounds normal. No respiratory distress.  has no wheezes.  Neck = supple, no nuchal rigidity Abdominal: Soft. Bowel sounds are normal.  exhibits no distension. There is no tenderness.  Lymphadenopathy: no cervical adenopathy. No axillary adenopathy Neurological: alert and oriented to  person, place, and time.  Ext 2+ edema bil LE Skin: bil chronic venous stasis change, with lipoderamtosclerosis around lower legs. L leg with mild warmth and redness. 2 small pockets of purulence ant shin which I could express a tiny amt of pus Psychiatric: a normal mood and affect.  behavior is normal.    LABS: Results for orders placed or performed during the hospital encounter of 08/08/17 (from the past 48 hour(s))  Comprehensive metabolic panel     Status: Abnormal   Collection Time: 08/08/17  1:42 PM  Result Value Ref Range   Sodium 137 135 - 145 mmol/L   Potassium 5.7 (H) 3.5 - 5.1 mmol/L   Chloride 105 101 - 111 mmol/L   CO2 26 22 - 32 mmol/L   Glucose, Bld 86 65 - 99 mg/dL   BUN 32 (H) 6  - 20 mg/dL   Creatinine, Ser 1.86 (H) 0.44 - 1.00 mg/dL   Calcium 9.1 8.9 - 10.3 mg/dL   Total Protein 6.9 6.5 - 8.1 g/dL   Albumin 3.8 3.5 - 5.0 g/dL   AST 22 15 - 41 U/L   ALT 18 14 - 54 U/L   Alkaline Phosphatase 95 38 - 126 U/L   Total Bilirubin 0.5 0.3 - 1.2 mg/dL   GFR calc non Af Amer 28 (L) >60 mL/min   GFR calc Af Amer 33 (L) >60 mL/min    Comment: (NOTE) The eGFR has been calculated using the CKD EPI equation. This calculation has not been validated in all clinical situations. eGFR's persistently <60 mL/min signify possible Chronic Kidney Disease.    Anion gap 6 5 - 15  CBC     Status: Abnormal   Collection Time: 08/08/17  1:42 PM  Result Value Ref Range   WBC 5.3 3.6 - 11.0 K/uL   RBC 3.64 (L) 3.80 - 5.20 MIL/uL   Hemoglobin 11.3 (L) 12.0 - 16.0 g/dL   HCT 33.6 (L) 35.0 - 47.0 %   MCV 92.2 80.0 - 100.0 fL   MCH 30.9 26.0 - 34.0 pg   MCHC 33.5 32.0 - 36.0 g/dL   RDW 14.1 11.5 - 14.5 %   Platelets 151 150 - 440 K/uL  Glucose, capillary     Status: None   Collection Time: 08/08/17  9:11 PM  Result Value Ref Range   Glucose-Capillary 74 65 - 99 mg/dL  Hemoglobin A1c     Status: Abnormal   Collection Time: 08/09/17  4:42 AM  Result Value Ref Range   Hgb A1c MFr Bld 7.0 (H) 4.8 - 5.6 %    Comment: (NOTE) Pre diabetes:          5.7%-6.4% Diabetes:              >6.4% Glycemic control for   <7.0% adults with diabetes    Mean Plasma Glucose 154.2 mg/dL    Comment: Performed at [facility], [place] [address] [place], [place] [address]  CBC     Status: Abnormal   Collection Time: 08/09/17  4:42 AM  Result Value Ref Range   WBC 3.9 3.6 - 11.0 K/uL   RBC 3.32 (L) 3.80 - 5.20 MIL/uL   Hemoglobin 10.2 (L) 12.0 - 16.0 g/dL   HCT 30.7 (L) 35.0 - 47.0 %   MCV 92.5 80.0 - 100.0 fL   MCH 30.8 26.0 - 34.0 pg   MCHC 33.3 32.0 - 36.0 g/dL   RDW 14.4 11.5 - 14.5 %   Platelets  131 (L) 150 - 440 K/uL  Basic metabolic panel     Status: Abnormal   Collection Time:  08/09/17  4:42 AM  Result Value Ref Range   Sodium 139 135 - 145 mmol/L   Potassium 4.9 3.5 - 5.1 mmol/L   Chloride 111 101 - 111 mmol/L   CO2 23 22 - 32 mmol/L   Glucose, Bld 116 (H) 65 - 99 mg/dL   BUN 23 (H) 6 - 20 mg/dL   Creatinine, Ser 1.42 (H) 0.44 - 1.00 mg/dL   Calcium 8.3 (L) 8.9 - 10.3 mg/dL   GFR calc non Af Amer 39 (L) >60 mL/min   GFR calc Af Amer 45 (L) >60 mL/min    Comment: (NOTE) The eGFR has been calculated using the CKD EPI equation. This calculation has not been validated in all clinical situations. eGFR's persistently <60 mL/min signify possible Chronic Kidney Disease.    Anion gap 5 5 - 15  Glucose, capillary     Status: None   Collection Time: 08/09/17  7:40 AM  Result Value Ref Range   Glucose-Capillary 99 65 - 99 mg/dL  Glucose, capillary     Status: Abnormal   Collection Time: 08/09/17 11:38 AM  Result Value Ref Range   Glucose-Capillary 137 (H) 65 - 99 mg/dL   No components found for: ESR, C REACTIVE PROTEIN MICRO: No results found for this or any previous visit (from the past 720 hour(s)).  IMAGING: Dg Tibia/fibula Left  Result Date: 08/08/2017 CLINICAL DATA:  Swelling, redness and warmth 2 distal left tibia and fibula for 2 weeks. EXAM: LEFT TIBIA AND FIBULA - 2 VIEW COMPARISON:  None. FINDINGS: Mild diffuse soft tissue swelling or edema within the calf. No bony abnormality. No fracture, subluxation or dislocation. IMPRESSION: No acute bony abnormality. Electronically Signed   By: Rolm Baptise M.D.   On: 08/08/2017 14:02   US Renal  Result Date: 08/08/2017 CLINICAL DATA:  Acute kidney injury EXAM: RENAL / URINARY TRACT ULTRASOUND COMPLETE COMPARISON:  None. FINDINGS: Right Kidney: Length: 10.4 cm. Echogenicity within normal limits. No mass or hydronephrosis visualized. Left Kidney: Length: 10.2 cm. Echogenicity within normal limits. No mass or hydronephrosis visualized. Suggested mild cortical thinning Bladder: Appears normal for degree of bladder  distention. IMPRESSION: Negative for hydronephrosis or focal renal abnormality. Suspected mild amount of left greater than right cortical thinning suggestive of mild atrophy. Electronically Signed   By: Donavan Foil M.D.   On: 08/08/2017 19:49   US Venous Img Lower Unilateral Left  Result Date: 08/08/2017 CLINICAL DATA:  Pain, erythema and swelling of the left lower extremity. EXAM: left LOWER EXTREMITY VENOUS DOPPLER ULTRASOUND TECHNIQUE: Gray-scale sonography with graded compression, as well as color Doppler and duplex ultrasound were performed to evaluate the lower extremity deep venous systems from the level of the common femoral vein and including the common femoral, femoral, profunda femoral, popliteal and calf veins including the posterior tibial, peroneal and gastrocnemius veins when visible. The superficial great saphenous vein was also interrogated. Spectral Doppler was utilized to evaluate flow at rest and with distal augmentation maneuvers in the common femoral, femoral and popliteal veins. COMPARISON:  None. FINDINGS: Contralateral Common Femoral Vein: Respiratory phasicity is normal and symmetric with the symptomatic side. No evidence of thrombus. Normal compressibility. Common Femoral Vein: No evidence of thrombus. Normal compressibility, respiratory phasicity and response to augmentation. Saphenofemoral Junction: No evidence of thrombus. Normal compressibility and flow on color Doppler imaging. Profunda Femoral Vein: No evidence of thrombus.  Normal compressibility and flow on color Doppler imaging. Femoral Vein: No evidence of thrombus. Normal compressibility, respiratory phasicity and response to augmentation. Popliteal Vein: No evidence of thrombus. Normal compressibility, respiratory phasicity and response to augmentation. Calf Veins: No evidence of thrombus. Normal compressibility and flow on color Doppler imaging. Superficial Great Saphenous Vein: No evidence of thrombus. Normal  compressibility and flow on color Doppler imaging. Venous Reflux:  None. Other Findings:  None. IMPRESSION: No evidence of DVT within the left lower extremity. Electronically Signed   By: Abelardo Diesel M.D.   On: 08/08/2017 16:48    Assessment:   BRIONA KORPELA is a 61 y.o. female with morbid obesity, DM, venous stasis and chronic LE edema previously followed at [facility] vascular now with worsening LLE redness, swelling and pain. On exam has 2 small pockets of purulence which I have cultured. She has no fever or leukocytosis.  I think with elevation and control of edema she can be dced on orals tomorrow  Recommendations Continue vanco tonight Elevate leg on 6 pillows - discussed with patient If improving tomorrow would dc on oral clindamycin 300 mg TID for 7 days She should have an unnawrap placed tomorrow prior to DC and then can have it removed in one week at home.  She can follow up with her PCP (Dr Ladoris Gene) in 1-2 weeks and should follow up with [facility] vascular.  Discussed need for control of chronic edema with compression stockings.   Thank you very much for allowing me to participate in the care of this patient. Please call with questions.   Cheral Marker. Ola Spurr, MD

## 2017-08-09 NOTE — Care Management Note (Signed)
Case Management Note  Patient Details  Name: Jasmine Davidson MRN: [id number] Date of Birth: June 09, 1956  Subjective/Objective:    Admitted to [facility] with the diagnosis of Left leg cellulitis.   Lives with husband, [name] [phone]). Last seen Dr. William Hamburger yesterday. Prescriptions are filled at [facility]. No home health. No skilled facility. No home oxygen. Rolling walker and cane in the home. Takes care of all basic and instrumental activities of daily living herself, drives. Last fall was several years ago. Lost 7 pounds in the last 2 weeks. Husband or daughter will transport         Action/Plan: Medicaid application taken per [facility] today. No discharge needs identified.   Expected Discharge Date:  08/10/17               Expected Discharge Plan:     In-House Referral:     Discharge planning Services     Post Acute Care Choice:    Choice offered to:     DME Arranged:    DME Agency:     HH Arranged:    HH Agency:     Status of Service:     If discussed at [employer], dates discussed:    Additional Comments:  Shelbie Ammons, [place] Management [phone] 08/09/2017, 1:07 PM

## 2017-08-09 NOTE — Plan of Care (Signed)
Problem: Education: Goal: Knowledge of [facility] Education information/materials will improve Outcome: Progressing Pt likes to called Jasmine Davidson  Past Medical History:  Diagnosis Date  . CAD (coronary artery disease)   . Diabetes mellitus   . HTN (hypertension)   . Obesity    Pt is well controlled with home medications

## 2017-08-10 ENCOUNTER — Telehealth: Payer: Self-pay | Admitting: Pharmacy Technician

## 2017-08-10 LAB — HIV ANTIBODY (ROUTINE TESTING W REFLEX): HIV Screen 4th Generation wRfx: NONREACTIVE

## 2017-08-10 LAB — GLUCOSE, CAPILLARY
Glucose-Capillary: 103 mg/dL — ABNORMAL HIGH (ref 65–99)
Glucose-Capillary: 264 mg/dL — ABNORMAL HIGH (ref 65–99)

## 2017-08-10 MED ORDER — CLINDAMYCIN HCL 300 MG PO CAPS: 300.0000 mg | ORAL_CAPSULE | Freq: Three times a day (TID) | ORAL | 0 refills | Status: AC

## 2017-08-10 NOTE — Progress Notes (Signed)
Id E note Gram stain with [facility].  If leg is improved some today would place unnawrap and dc on oral clindamycin. I can fu on cx result. She can remove the unnawrap in 5-7 days at home.  Should see her PCP and [facility] vascular for the venous insufficiency Should use compression stocking after the unnawrap is removed.

## 2017-08-10 NOTE — Consult Note (Signed)
[facility] Nurse wound consult note Reason for Consult: Apply Unna's boot prior to discharge Wound type: Venous insufficiency Pressure Injury POA: N/A Measurement:No wound Wound bed:No wound Drainage (amount, consistency, odor) No wound Periwound: Unremarkable Dressing procedure/placement/frequency: LE and foot cleansed and gently dried.  Unna's boot applied from metatarsal head to patellar notch, this is topped with conform dressing.  Coban (4-inch) wrapped from toe to knee. Patient is able to flex foot at ankle and states atht boot is comfortable.  Instructed that if boot feels "tight" that she can elevate LE above heart for 30 minutes and it should be more comfortable.  In case of extreme discomfort, boot can be unwrapped or cut off to remove. Patient and husband state that they have an MD appointment on Tuesday, 9/4 and they are encouraged to keep that appointment. Patient is for discharge today. [facility] nursing team will not follow, but will remain available to this patient, the nursing and medical teams.  Please re-consult if needed. Thanks, Maudie Flakes, MSN, RN, [place], Arther Abbott  Pager# [phone]

## 2017-08-10 NOTE — Progress Notes (Signed)
[facility] at [place] NAME: Jasmine Davidson    MR#:  [id number]  DATE OF BIRTH:  January 12, 1956  SUBJECTIVE:  CHIEF COMPLAINT:   Chief Complaint  Patient presents with  . Leg Swelling   Pain LLE better. Swelling is improving  REVIEW OF SYSTEMS:    Review of Systems  Constitutional: Negative for chills and fever.  HENT: Negative for sore throat.   Eyes: Negative for blurred vision, double vision and pain.  Respiratory: Negative for cough, hemoptysis, shortness of breath and wheezing.   Cardiovascular: Negative for chest pain, palpitations, orthopnea and leg swelling.  Gastrointestinal: Negative for abdominal pain, constipation, diarrhea, heartburn, nausea and vomiting.  Genitourinary: Negative for dysuria and hematuria.  Musculoskeletal: Positive for joint pain. Negative for back pain.  Skin: Negative for rash.  Neurological: Negative for sensory change, speech change, focal weakness and headaches.  Endo/Heme/Allergies: Does not bruise/bleed easily.  Psychiatric/Behavioral: Negative for depression. The patient is not nervous/anxious.     DRUG ALLERGIES:   Allergies  Allergen Reactions  . Pregabalin     VITALS:  Blood pressure (!) 124/48, pulse 60, temperature 97.6 F (36.4 C), temperature source Oral, resp. rate 14, height 5\' 6"  (1.676 m), weight 119.3 kg (262 lb 14.4 oz), last menstrual period 09/12/1999, SpO2 97 %.  PHYSICAL EXAMINATION:   Physical Exam  GENERAL:  61 y.o.-year-old patient lying in the bed with no acute distress.  EYES: Pupils equal, round, reactive to light and accommodation. No scleral icterus. Extraocular muscles intact.  HEENT: Head atraumatic, normocephalic. Oropharynx and nasopharynx clear.  NECK:  Supple, no jugular venous distention. No thyroid enlargement, no tenderness.  LUNGS: Normal breath sounds bilaterally, no wheezing, rales, rhonchi. No use of accessory muscles of respiration.  CARDIOVASCULAR: S1,  S2 normal. No murmurs, rubs, or gallops.  ABDOMEN: Soft, nontender, nondistended. Bowel sounds present. No organomegaly or mass.  NEUROLOGIC: Cranial nerves II through XII are intact. No focal Motor or sensory deficits b/l.   PSYCHIATRIC: The patient is alert and oriented x 3.  SKIN: Left leg has erythema, warm and 2 small ulcers with purulence  LABORATORY PANEL:   CBC  Recent Labs Lab 08/09/17 0442  WBC 3.9  HGB 10.2*  HCT 30.7*  PLT 131*   ------------------------------------------------------------------------------------------------------------------ Chemistries   Recent Labs Lab 08/08/17 1342 08/09/17 0442  NA 137 139  K 5.7* 4.9  CL 105 111  CO2 26 23  GLUCOSE 86 116*  BUN 32* 23*  CREATININE 1.86* 1.42*  CALCIUM 9.1 8.3*  AST 22  --   ALT 18  --   ALKPHOS 95  --   BILITOT 0.5  --    ------------------------------------------------------------------------------------------------------------------  Cardiac Enzymes No results for input(s): TROPONINI in the last 168 hours. ------------------------------------------------------------------------------------------------------------------  RADIOLOGY:  Dg Tibia/fibula Left  Result Date: 08/08/2017 CLINICAL DATA:  Swelling, redness and warmth 2 distal left tibia and fibula for 2 weeks. EXAM: LEFT TIBIA AND FIBULA - 2 VIEW COMPARISON:  None. FINDINGS: Mild diffuse soft tissue swelling or edema within the calf. No bony abnormality. No fracture, subluxation or dislocation. IMPRESSION: No acute bony abnormality. Electronically Signed   By: Rolm Baptise M.D.   On: 08/08/2017 14:02   US Renal  Result Date: 08/08/2017 CLINICAL DATA:  Acute kidney injury EXAM: RENAL / URINARY TRACT ULTRASOUND COMPLETE COMPARISON:  None. FINDINGS: Right Kidney: Length: 10.4 cm. Echogenicity within normal limits. No mass or hydronephrosis visualized. Left Kidney: Length: 10.2 cm. Echogenicity within normal limits. No  mass or hydronephrosis  visualized. Suggested mild cortical thinning Bladder: Appears normal for degree of bladder distention. IMPRESSION: Negative for hydronephrosis or focal renal abnormality. Suspected mild amount of left greater than right cortical thinning suggestive of mild atrophy. Electronically Signed   By: Donavan Foil M.D.   On: 08/08/2017 19:49   US Venous Img Lower Unilateral Left  Result Date: 08/08/2017 CLINICAL DATA:  Pain, erythema and swelling of the left lower extremity. EXAM: left LOWER EXTREMITY VENOUS DOPPLER ULTRASOUND TECHNIQUE: Gray-scale sonography with graded compression, as well as color Doppler and duplex ultrasound were performed to evaluate the lower extremity deep venous systems from the level of the common femoral vein and including the common femoral, femoral, profunda femoral, popliteal and calf veins including the posterior tibial, peroneal and gastrocnemius veins when visible. The superficial great saphenous vein was also interrogated. Spectral Doppler was utilized to evaluate flow at rest and with distal augmentation maneuvers in the common femoral, femoral and popliteal veins. COMPARISON:  None. FINDINGS: Contralateral Common Femoral Vein: Respiratory phasicity is normal and symmetric with the symptomatic side. No evidence of thrombus. Normal compressibility. Common Femoral Vein: No evidence of thrombus. Normal compressibility, respiratory phasicity and response to augmentation. Saphenofemoral Junction: No evidence of thrombus. Normal compressibility and flow on color Doppler imaging. Profunda Femoral Vein: No evidence of thrombus. Normal compressibility and flow on color Doppler imaging. Femoral Vein: No evidence of thrombus. Normal compressibility, respiratory phasicity and response to augmentation. Popliteal Vein: No evidence of thrombus. Normal compressibility, respiratory phasicity and response to augmentation. Calf Veins: No evidence of thrombus. Normal compressibility and flow on color  Doppler imaging. Superficial Great Saphenous Vein: No evidence of thrombus. Normal compressibility and flow on color Doppler imaging. Venous Reflux:  None. Other Findings:  None. IMPRESSION: No evidence of DVT within the left lower extremity. Electronically Signed   By: Abelardo Diesel M.D.   On: 08/08/2017 16:48     ASSESSMENT AND PLAN:   Jasmine Davidson  is a 61 y.o. female with a known history of insulin requiring diabetes mother, hypertension, coronary artery disease and obesity is presenting to the ED with a chief complaint of left leg swelling and pain associated with redness for couple of weeks. Patient was seen by primary care physician and initially completed a course of Bactrim following that penicillin course with no improvement.patient came into the emergency department   # left lower extremity cellulitis Failed bactrim and Penicillin Now on vancomycin and improving Consult ID Clindamycin at discharge  # Acute kidney injury - likely bactrim/dehydration On IVF Improving  # Hyperkalemia Kayexalate given Improving   # hypertension Lisinopril and HCTZ held due to AKI  #hyperlipidemia continue statin  All the records are reviewed and case discussed with Care Management/Social Worker Management plans discussed with the patient, family and they are in agreement.  CODE STATUS: FULL CODE  DVT Prophylaxis: SCDs  TOTAL TIME TAKING CARE OF THIS PATIENT: 30 minutes.   POSSIBLE D/C IN 1-2 DAYS, DEPENDING ON CLINICAL CONDITION.  Hillary Bow R M.D on 08/10/2017 at 7:42 AM  Between 7am to 6pm - Pager - [phone]  After 6pm go to [email or web address] - password [email or web address] [facility]  Office  [phone]  CC: Primary care physician; Theotis Burrow, MD  Note: This dictation was prepared with Dragon dictation along with smaller phrase technology. Any transcriptional errors that result from this process are unintentional.

## 2017-08-10 NOTE — Progress Notes (Signed)
Pt discharged via wheelchair by a volunteer to the visitor's entrance

## 2017-08-10 NOTE — Care Management (Signed)
Met with patient to discuss discharge planning. [facility] following for potential [facility] IV antibiotics if necessary.  Patient is from home with her husband. She is independent with daily activities. She is requesting to take a shower; RN addressing.  PCP is with [facility] and they also assist with medications. Application shared with patient for medication management in case she cannot get across town to [facility] at time of discharge before they close.

## 2017-08-10 NOTE — Telephone Encounter (Signed)
Provide patient with [facility]'s new patient packet.  Provided contact information for [facility] and [employer].  [facility]

## 2017-08-10 NOTE — Progress Notes (Signed)
MD order received to discharge pt home today; verbally reviewed AVS with pt including pt removing unna boot from left lower extremity herself in 1 week, no questions voiced at this time; pt's discharge pending arrival of volunteer for discharge

## 2017-08-12 LAB — AEROBIC CULTURE  (SUPERFICIAL SPECIMEN): Gram Stain: NONE SEEN

## 2017-08-12 LAB — AEROBIC CULTURE W GRAM STAIN (SUPERFICIAL SPECIMEN): Gram Stain: NONE SEEN

## 2017-08-15 NOTE — Discharge Summary (Signed)
[facility] at [place] NAME: Jasmine Davidson    MR#:  [id number]  DATE OF BIRTH:  02/05/1956  DATE OF ADMISSION:  08/08/2017 ADMITTING PHYSICIAN: Nicholes Mango, MD  DATE OF DISCHARGE: 08/10/2017  2:20 PM  PRIMARY CARE PHYSICIAN: Theotis Burrow, MD   ADMISSION DIAGNOSIS:  Cellulitis of left lower extremity [V95.638] AKI (acute kidney injury) ([place]) [N17.9]  DISCHARGE DIAGNOSIS:  Active Problems:   Cellulitis   SECONDARY DIAGNOSIS:   Past Medical History:  Diagnosis Date  . CAD (coronary artery disease)   . Diabetes mellitus   . HTN (hypertension)   . Obesity      ADMITTING HISTORY  HISTORY OF PRESENT ILLNESS:  Jasmine Davidson  is a 61 y.o. female with a known history of insulin requiring diabetes mother, hypertension, coronary artery disease and obesity is presenting to the ED with a chief complaint of left leg swelling and pain associated with redness for couple of weeks. Patient was seen by primary care physician and initially completed a course of Bactrim following that penicillin course with no improvement.patient came into the emergency department as her pain and redness are getting worse in the left leg. Venous Dopplers are negative DVT. Patient is started on IV vancomycin and hospitalist team is called to admit the patient. Potassium is elevated at 5.7   HOSPITAL COURSE:   * left leg cellulitis Patient was admitted as she had failed outpatient treatment with Bactrim and penicillin. Patient was started on IV vancomycin in the hospital to which she responded well. Redness and warmth improved significantly. There are 2 small ulcers with purulence that were cultured and sent to the lab. Requested an infectious disease consult and Dr. Ola Spurr saw the patient. Suggested clindamycin orally at discharge. Prescription given. Patient is afebrile with normal WBC. Feels significantly better. Discharged home in stable condition.  * acute  kidney injury with hyper kalemia likely due to Bactrim. Started on IV fluids and resolved.  Other comorbidities remained stable.   CONSULTS OBTAINED:  Treatment Team:  Leonel Ramsay, MD  DRUG ALLERGIES:   Allergies  Allergen Reactions  . Pregabalin     DISCHARGE MEDICATIONS:   Discharge Medication List as of 08/10/2017 12:57 PM    START taking these medications   Details  clindamycin (CLEOCIN) 300 MG capsule Take 1 capsule (300 mg total) by mouth 3 (three) times daily., Starting Thu 08/10/2017, Until Sun 08/20/2017, Normal      CONTINUE these medications which have NOT CHANGED   Details  aspirin (ASPIR-81) 81 MG EC tablet Take 81 mg by mouth daily.  , Historical Med    atorvastatin (LIPITOR) 40 MG tablet TAKE ONE TABLET BY MOUTH EVERY DAY, Normal    gabapentin (NEURONTIN) 300 MG capsule Take 300 mg by mouth 2 (two) times daily., Historical Med    insulin NPH-insulin regular (HUMULIN 70/30) (70-30) 100 UNIT/ML injection Inject 10-15 Units into the skin 2 (two) times daily. 15 units every morning and 10 units every evening, Historical Med    lisinopril-hydrochlorothiazide (PRINZIDE,ZESTORETIC) 20-12.5 MG per tablet TAKE TWO TABLETS BY MOUTH ONCE DAILY, Normal    metFORMIN (GLUCOPHAGE) 1000 MG tablet Take 1,000 mg by mouth 2 (two) times daily., Historical Med    metoprolol succinate (TOPROL-XL) 50 MG 24 hr tablet Take 1 tablet (50 mg total) by mouth daily., Starting Mon 09/16/2013, Normal        Today   VITAL SIGNS:  Blood pressure (!) 124/49, pulse (!) 58,  temperature (!) 97.5 F (36.4 C), temperature source Oral, resp. rate 14, height 5\' 6"  (1.676 m), weight 119.3 kg (262 lb 14.4 oz), last menstrual period 09/12/1999, SpO2 99 %.  I/O:  No intake or output data in the 24 hours ending 08/15/17 1445  PHYSICAL EXAMINATION:  Physical Exam  GENERAL:  61 y.o.-year-old patient lying in the bed with no acute distress.  LUNGS: Normal breath sounds bilaterally, no  wheezing, rales,rhonchi or crepitation. No use of accessory muscles of respiration.  CARDIOVASCULAR: S1, S2 normal. No murmurs, rubs, or gallops.  ABDOMEN: Soft, non-tender, non-distended. Bowel sounds present. No organomegaly or mass.  NEUROLOGIC: Moves all 4 extremities. PSYCHIATRIC: The patient is alert and oriented x 3.  SKIN: left leg redness and warmth with to prevent small ulcers.  DATA REVIEW:   CBC  Recent Labs Lab 08/09/17 0442  WBC 3.9  HGB 10.2*  HCT 30.7*  PLT 131*    Chemistries   Recent Labs Lab 08/09/17 0442  NA 139  K 4.9  CL 111  CO2 23  GLUCOSE 116*  BUN 23*  CREATININE 1.42*  CALCIUM 8.3*    Cardiac Enzymes No results for input(s): TROPONINI in the last 168 hours.  Microbiology Results  Results for orders placed or performed during the hospital encounter of 08/08/17  Aerobic Culture (superficial specimen)     Status: None   Collection Time: 08/09/17  3:00 PM  Result Value Ref Range Status   Specimen Description WOUND  Final   Special Requests LEFT LEG  Final   Gram Stain   Final    NO WBC SEEN RARE GRAM POSITIVE COCCI Performed at [facility], [address], [place], [place] [address]    Culture FEW STAPHYLOCOCCUS AUREUS  Final   Report Status 08/12/2017 FINAL  Final   Organism ID, Bacteria STAPHYLOCOCCUS AUREUS  Final      Susceptibility   Staphylococcus aureus - MIC*    CIPROFLOXACIN <=0.5 SENSITIVE Sensitive     ERYTHROMYCIN <=0.25 SENSITIVE Sensitive     GENTAMICIN <=0.5 SENSITIVE Sensitive     OXACILLIN 0.5 SENSITIVE Sensitive     TETRACYCLINE <=1 SENSITIVE Sensitive     VANCOMYCIN <=0.5 SENSITIVE Sensitive     TRIMETH/SULFA <=10 SENSITIVE Sensitive     CLINDAMYCIN <=0.25 SENSITIVE Sensitive     RIFAMPIN <=0.5 SENSITIVE Sensitive     Inducible Clindamycin NEGATIVE Sensitive     * FEW STAPHYLOCOCCUS AUREUS  Aerobic Culture (superficial specimen)     Status: None   Collection Time: 08/09/17  3:00 PM  Result Value Ref  Range Status   Specimen Description WOUND  Final   Special Requests LEFT LEG  Final   Gram Stain   Final    NO WBC SEEN RARE GRAM POSITIVE COCCI Performed at [facility], [place] [address] [place], [place] [address]    Culture RARE STAPHYLOCOCCUS AUREUS  Final   Report Status 08/12/2017 FINAL  Final   Organism ID, Bacteria STAPHYLOCOCCUS AUREUS  Final      Susceptibility   Staphylococcus aureus - MIC*    CIPROFLOXACIN <=0.5 SENSITIVE Sensitive     ERYTHROMYCIN <=0.25 SENSITIVE Sensitive     GENTAMICIN <=0.5 SENSITIVE Sensitive     OXACILLIN 0.5 SENSITIVE Sensitive     TETRACYCLINE <=1 SENSITIVE Sensitive     VANCOMYCIN <=0.5 SENSITIVE Sensitive     TRIMETH/SULFA <=10 SENSITIVE Sensitive     CLINDAMYCIN <=0.25 SENSITIVE Sensitive     RIFAMPIN <=0.5 SENSITIVE Sensitive  Inducible Clindamycin NEGATIVE Sensitive     * RARE STAPHYLOCOCCUS AUREUS    RADIOLOGY:  No results found.  Follow up with PCP in 1 week.  Management plans discussed with the patient, family and they are in agreement.  CODE STATUS:  Code Status History    Date Active Date Inactive Code Status Order ID Comments User Context   08/08/2017  9:12 PM 08/10/2017  5:52 PM Full Code [id number]  Nicholes Mango, MD Inpatient      TOTAL TIME TAKING CARE OF THIS PATIENT ON DAY OF DISCHARGE: more than 30 minutes.   Hillary Bow R M.D on 08/15/2017 at 2:45 PM  Between 7am to 6pm - Pager - [phone]  After 6pm go to [email or web address] - password [email or web address] [facility]  Office  [phone]  CC: Primary care physician; Theotis Burrow, MD  Note: This dictation was prepared with Dragon dictation along with smaller phrase technology. Any transcriptional errors that result from this process are unintentional.

## 2020-03-25 ENCOUNTER — Other Ambulatory Visit: Payer: Self-pay | Admitting: Family Medicine

## 2020-03-25 DIAGNOSIS — R42 Dizziness and giddiness: Secondary | ICD-10-CM

## 2020-03-31 ENCOUNTER — Ambulatory Visit
Admission: RE | Admit: 2020-03-31 | Discharge: 2020-03-31 | Disposition: A | Discharge status: Home / Self Care | Payer: Medicare HMO | Source: Ambulatory Visit | Attending: Family Medicine | Admitting: Family Medicine

## 2020-03-31 ENCOUNTER — Other Ambulatory Visit: Payer: Self-pay

## 2020-03-31 DIAGNOSIS — R42 Dizziness and giddiness: Secondary | ICD-10-CM | POA: Diagnosis present

## 2020-04-12 ENCOUNTER — Encounter: Payer: Self-pay | Admitting: Emergency Medicine

## 2020-04-12 ENCOUNTER — Other Ambulatory Visit: Payer: Self-pay

## 2020-04-12 DIAGNOSIS — I953 Hypotension of hemodialysis: Secondary | ICD-10-CM | POA: Diagnosis present

## 2020-04-12 DIAGNOSIS — N183 Chronic kidney disease, stage 3 unspecified: Secondary | ICD-10-CM

## 2020-04-12 DIAGNOSIS — Z79899 Other long term (current) drug therapy: Secondary | ICD-10-CM

## 2020-04-12 DIAGNOSIS — Z888 Allergy status to other drugs, medicaments and biological substances status: Secondary | ICD-10-CM

## 2020-04-12 DIAGNOSIS — Z825 Family history of asthma and other chronic lower respiratory diseases: Secondary | ICD-10-CM

## 2020-04-12 DIAGNOSIS — N179 Acute kidney failure, unspecified: Principal | ICD-10-CM | POA: Diagnosis present

## 2020-04-12 DIAGNOSIS — Z6839 Body mass index (BMI) 39.0-39.9, adult: Secondary | ICD-10-CM

## 2020-04-12 DIAGNOSIS — E11621 Type 2 diabetes mellitus with foot ulcer: Secondary | ICD-10-CM | POA: Diagnosis present

## 2020-04-12 DIAGNOSIS — L97509 Non-pressure chronic ulcer of other part of unspecified foot with unspecified severity: Secondary | ICD-10-CM | POA: Diagnosis present

## 2020-04-12 DIAGNOSIS — E1122 Type 2 diabetes mellitus with diabetic chronic kidney disease: Secondary | ICD-10-CM | POA: Diagnosis present

## 2020-04-12 DIAGNOSIS — I2510 Atherosclerotic heart disease of native coronary artery without angina pectoris: Secondary | ICD-10-CM | POA: Diagnosis present

## 2020-04-12 DIAGNOSIS — E875 Hyperkalemia: Secondary | ICD-10-CM | POA: Diagnosis present

## 2020-04-12 DIAGNOSIS — E860 Dehydration: Secondary | ICD-10-CM | POA: Diagnosis present

## 2020-04-12 DIAGNOSIS — N1832 Chronic kidney disease, stage 3b: Secondary | ICD-10-CM | POA: Diagnosis present

## 2020-04-12 DIAGNOSIS — Z7982 Long term (current) use of aspirin: Secondary | ICD-10-CM

## 2020-04-12 DIAGNOSIS — Z833 Family history of diabetes mellitus: Secondary | ICD-10-CM

## 2020-04-12 DIAGNOSIS — R42 Dizziness and giddiness: Secondary | ICD-10-CM | POA: Diagnosis not present

## 2020-04-12 DIAGNOSIS — Z8616 Personal history of COVID-19: Secondary | ICD-10-CM

## 2020-04-12 DIAGNOSIS — E872 Acidosis: Secondary | ICD-10-CM | POA: Diagnosis present

## 2020-04-12 DIAGNOSIS — E785 Hyperlipidemia, unspecified: Secondary | ICD-10-CM | POA: Diagnosis present

## 2020-04-12 DIAGNOSIS — E11319 Type 2 diabetes mellitus with unspecified diabetic retinopathy without macular edema: Secondary | ICD-10-CM | POA: Diagnosis present

## 2020-04-12 DIAGNOSIS — Z20822 Contact with and (suspected) exposure to covid-19: Secondary | ICD-10-CM | POA: Diagnosis present

## 2020-04-12 DIAGNOSIS — Z794 Long term (current) use of insulin: Secondary | ICD-10-CM

## 2020-04-12 DIAGNOSIS — E1140 Type 2 diabetes mellitus with diabetic neuropathy, unspecified: Secondary | ICD-10-CM | POA: Diagnosis present

## 2020-04-12 DIAGNOSIS — E669 Obesity, unspecified: Secondary | ICD-10-CM | POA: Diagnosis present

## 2020-04-12 DIAGNOSIS — Z832 Family history of diseases of the blood and blood-forming organs and certain disorders involving the immune mechanism: Secondary | ICD-10-CM

## 2020-04-12 DIAGNOSIS — K5900 Constipation, unspecified: Secondary | ICD-10-CM | POA: Diagnosis not present

## 2020-04-12 DIAGNOSIS — I129 Hypertensive chronic kidney disease with stage 1 through stage 4 chronic kidney disease, or unspecified chronic kidney disease: Secondary | ICD-10-CM | POA: Diagnosis present

## 2020-04-12 DIAGNOSIS — R339 Retention of urine, unspecified: Secondary | ICD-10-CM | POA: Diagnosis not present

## 2020-04-12 DIAGNOSIS — Z8249 Family history of ischemic heart disease and other diseases of the circulatory system: Secondary | ICD-10-CM

## 2020-04-12 LAB — CBC
HCT: 43 % (ref 36.0–46.0)
Hemoglobin: 14.2 g/dL (ref 12.0–15.0)
MCH: 30.7 pg (ref 26.0–34.0)
MCHC: 33 g/dL (ref 30.0–36.0)
MCV: 93.1 fL (ref 80.0–100.0)
Platelets: 185 10*3/uL (ref 150–400)
RBC: 4.62 MIL/uL (ref 3.87–5.11)
RDW: 12.9 % (ref 11.5–15.5)
WBC: 8.3 10*3/uL (ref 4.0–10.5)
nRBC: 0 % (ref 0.0–0.2)

## 2020-04-12 LAB — TROPONIN I (HIGH SENSITIVITY): Troponin I (High Sensitivity): 25 ng/L — ABNORMAL HIGH (ref <18)

## 2020-04-12 NOTE — ED Triage Notes (Signed)
Patient with complaint of dizziness and decreased appetite times one week.

## 2020-04-13 ENCOUNTER — Encounter
Admission: EM | Disposition: A | Discharge status: Home Health | Payer: Self-pay | Source: Home / Self Care | Attending: Internal Medicine

## 2020-04-13 ENCOUNTER — Encounter: Payer: Self-pay | Admitting: Family Medicine

## 2020-04-13 ENCOUNTER — Inpatient Hospital Stay: Payer: Medicare HMO

## 2020-04-13 ENCOUNTER — Other Ambulatory Visit: Payer: Self-pay

## 2020-04-13 ENCOUNTER — Inpatient Hospital Stay
Admission: EM | Admit: 2020-04-13 | Discharge: 2020-04-18 | DRG: 683 | Disposition: A | Discharge status: Home Health | Payer: Medicare HMO | Attending: Internal Medicine | Admitting: Internal Medicine

## 2020-04-13 DIAGNOSIS — E860 Dehydration: Secondary | ICD-10-CM | POA: Diagnosis present

## 2020-04-13 DIAGNOSIS — K5900 Constipation, unspecified: Secondary | ICD-10-CM | POA: Diagnosis not present

## 2020-04-13 DIAGNOSIS — Z794 Long term (current) use of insulin: Secondary | ICD-10-CM | POA: Diagnosis not present

## 2020-04-13 DIAGNOSIS — E11319 Type 2 diabetes mellitus with unspecified diabetic retinopathy without macular edema: Secondary | ICD-10-CM | POA: Diagnosis present

## 2020-04-13 DIAGNOSIS — E1159 Type 2 diabetes mellitus with other circulatory complications: Secondary | ICD-10-CM | POA: Diagnosis present

## 2020-04-13 DIAGNOSIS — N179 Acute kidney failure, unspecified: Principal | ICD-10-CM

## 2020-04-13 DIAGNOSIS — I10 Essential (primary) hypertension: Secondary | ICD-10-CM | POA: Diagnosis present

## 2020-04-13 DIAGNOSIS — I129 Hypertensive chronic kidney disease with stage 1 through stage 4 chronic kidney disease, or unspecified chronic kidney disease: Secondary | ICD-10-CM | POA: Diagnosis present

## 2020-04-13 DIAGNOSIS — N183 Chronic kidney disease, stage 3 unspecified: Secondary | ICD-10-CM | POA: Diagnosis present

## 2020-04-13 DIAGNOSIS — I953 Hypotension of hemodialysis: Secondary | ICD-10-CM | POA: Diagnosis present

## 2020-04-13 DIAGNOSIS — E119 Type 2 diabetes mellitus without complications: Secondary | ICD-10-CM

## 2020-04-13 DIAGNOSIS — L97509 Non-pressure chronic ulcer of other part of unspecified foot with unspecified severity: Secondary | ICD-10-CM | POA: Diagnosis present

## 2020-04-13 DIAGNOSIS — E875 Hyperkalemia: Secondary | ICD-10-CM

## 2020-04-13 DIAGNOSIS — E872 Acidosis: Secondary | ICD-10-CM | POA: Diagnosis present

## 2020-04-13 DIAGNOSIS — Z833 Family history of diabetes mellitus: Secondary | ICD-10-CM | POA: Diagnosis not present

## 2020-04-13 DIAGNOSIS — Z20822 Contact with and (suspected) exposure to covid-19: Secondary | ICD-10-CM | POA: Diagnosis present

## 2020-04-13 DIAGNOSIS — Z8616 Personal history of COVID-19: Secondary | ICD-10-CM | POA: Diagnosis not present

## 2020-04-13 DIAGNOSIS — Z79899 Other long term (current) drug therapy: Secondary | ICD-10-CM | POA: Diagnosis not present

## 2020-04-13 DIAGNOSIS — E11621 Type 2 diabetes mellitus with foot ulcer: Secondary | ICD-10-CM | POA: Diagnosis present

## 2020-04-13 DIAGNOSIS — N1832 Chronic kidney disease, stage 3b: Secondary | ICD-10-CM

## 2020-04-13 DIAGNOSIS — E1122 Type 2 diabetes mellitus with diabetic chronic kidney disease: Secondary | ICD-10-CM | POA: Diagnosis present

## 2020-04-13 DIAGNOSIS — N186 End stage renal disease: Secondary | ICD-10-CM

## 2020-04-13 DIAGNOSIS — E785 Hyperlipidemia, unspecified: Secondary | ICD-10-CM | POA: Diagnosis present

## 2020-04-13 DIAGNOSIS — R339 Retention of urine, unspecified: Secondary | ICD-10-CM | POA: Diagnosis not present

## 2020-04-13 DIAGNOSIS — E1140 Type 2 diabetes mellitus with diabetic neuropathy, unspecified: Secondary | ICD-10-CM | POA: Diagnosis present

## 2020-04-13 DIAGNOSIS — R42 Dizziness and giddiness: Secondary | ICD-10-CM | POA: Diagnosis present

## 2020-04-13 DIAGNOSIS — E669 Obesity, unspecified: Secondary | ICD-10-CM | POA: Diagnosis present

## 2020-04-13 DIAGNOSIS — Z6839 Body mass index (BMI) 39.0-39.9, adult: Secondary | ICD-10-CM | POA: Diagnosis not present

## 2020-04-13 DIAGNOSIS — I2510 Atherosclerotic heart disease of native coronary artery without angina pectoris: Secondary | ICD-10-CM | POA: Diagnosis present

## 2020-04-13 DIAGNOSIS — Z7982 Long term (current) use of aspirin: Secondary | ICD-10-CM | POA: Diagnosis not present

## 2020-04-13 LAB — HIV ANTIBODY (ROUTINE TESTING W REFLEX): HIV Screen 4th Generation wRfx: NONREACTIVE

## 2020-04-13 LAB — BASIC METABOLIC PANEL
Anion gap: 18 — ABNORMAL HIGH (ref 5–15)
Anion gap: 27 — ABNORMAL HIGH (ref 5–15)
Anion gap: 31 — ABNORMAL HIGH (ref 5–15)
BUN: 99 mg/dL — ABNORMAL HIGH (ref 8–23)
BUN: 102 mg/dL — ABNORMAL HIGH (ref 8–23)
BUN: 105 mg/dL — ABNORMAL HIGH (ref 8–23)
CO2: 19 mmol/L — ABNORMAL LOW (ref 22–32)
CO2: 12 mmol/L — ABNORMAL LOW (ref 22–32)
CO2: 12 mmol/L — ABNORMAL LOW (ref 22–32)
Calcium: 9.2 mg/dL (ref 8.9–10.3)
Calcium: 8.8 mg/dL — ABNORMAL LOW (ref 8.9–10.3)
Calcium: 8.9 mg/dL (ref 8.9–10.3)
Chloride: 97 mmol/L — ABNORMAL LOW (ref 98–111)
Chloride: 95 mmol/L — ABNORMAL LOW (ref 98–111)
Chloride: 94 mmol/L — ABNORMAL LOW (ref 98–111)
Creatinine, Ser: 9.26 mg/dL — ABNORMAL HIGH (ref 0.44–1.00)
Creatinine, Ser: 9.21 mg/dL — ABNORMAL HIGH (ref 0.44–1.00)
Creatinine, Ser: 9.43 mg/dL — ABNORMAL HIGH (ref 0.44–1.00)
GFR calc Af Amer: 5 mL/min — ABNORMAL LOW (ref >60)
GFR calc Af Amer: 5 mL/min — ABNORMAL LOW (ref >60)
GFR calc Af Amer: 5 mL/min — ABNORMAL LOW (ref >60)
GFR calc non Af Amer: 4 mL/min — ABNORMAL LOW (ref >60)
GFR calc non Af Amer: 4 mL/min — ABNORMAL LOW (ref >60)
GFR calc non Af Amer: 4 mL/min — ABNORMAL LOW (ref >60)
Glucose, Bld: 74 mg/dL (ref 70–99)
Glucose, Bld: 155 mg/dL — ABNORMAL HIGH (ref 70–99)
Glucose, Bld: 163 mg/dL — ABNORMAL HIGH (ref 70–99)
Potassium: 5.7 mmol/L — ABNORMAL HIGH (ref 3.5–5.1)
Potassium: 7.2 mmol/L (ref 3.5–5.1)
Potassium: 6.9 mmol/L (ref 3.5–5.1)
Sodium: 134 mmol/L — ABNORMAL LOW (ref 135–145)
Sodium: 134 mmol/L — ABNORMAL LOW (ref 135–145)
Sodium: 137 mmol/L (ref 135–145)

## 2020-04-13 LAB — CBC
HCT: 43.4 % (ref 36.0–46.0)
Hemoglobin: 13.9 g/dL (ref 12.0–15.0)
MCH: 30.6 pg (ref 26.0–34.0)
MCHC: 32 g/dL (ref 30.0–36.0)
MCV: 95.6 fL (ref 80.0–100.0)
Platelets: 184 10*3/uL (ref 150–400)
RBC: 4.54 MIL/uL (ref 3.87–5.11)
RDW: 13 % (ref 11.5–15.5)
WBC: 9.8 10*3/uL (ref 4.0–10.5)
nRBC: 0 % (ref 0.0–0.2)

## 2020-04-13 LAB — MRSA PCR SCREENING: MRSA by PCR: NEGATIVE

## 2020-04-13 LAB — PHOSPHORUS: Phosphorus: 9.3 mg/dL — ABNORMAL HIGH (ref 2.5–4.6)

## 2020-04-13 LAB — HEMOGLOBIN A1C
Hgb A1c MFr Bld: 6.6 % — ABNORMAL HIGH (ref 4.8–5.6)
Mean Plasma Glucose: 142.72 mg/dL

## 2020-04-13 LAB — RESPIRATORY PANEL BY RT PCR (FLU A&B, COVID)
Influenza A by PCR: NEGATIVE
Influenza B by PCR: NEGATIVE
SARS Coronavirus 2 by RT PCR: NEGATIVE

## 2020-04-13 LAB — POTASSIUM: Potassium: 4.5 mmol/L (ref 3.5–5.1)

## 2020-04-13 LAB — GLUCOSE, CAPILLARY
Glucose-Capillary: 15 mg/dL — CL (ref 70–99)
Glucose-Capillary: 61 mg/dL — ABNORMAL LOW (ref 70–99)
Glucose-Capillary: 81 mg/dL (ref 70–99)
Glucose-Capillary: 168 mg/dL — ABNORMAL HIGH (ref 70–99)
Glucose-Capillary: 155 mg/dL — ABNORMAL HIGH (ref 70–99)
Glucose-Capillary: 136 mg/dL — ABNORMAL HIGH (ref 70–99)
Glucose-Capillary: 111 mg/dL — ABNORMAL HIGH (ref 70–99)
Glucose-Capillary: 99 mg/dL (ref 70–99)
Glucose-Capillary: 106 mg/dL — ABNORMAL HIGH (ref 70–99)

## 2020-04-13 LAB — HEPATITIS B SURFACE ANTIGEN: Hepatitis B Surface Ag: NONREACTIVE

## 2020-04-13 LAB — HEPATITIS B SURFACE ANTIBODY,QUALITATIVE: Hep B S Ab: NONREACTIVE

## 2020-04-13 LAB — TROPONIN I (HIGH SENSITIVITY): Troponin I (High Sensitivity): 32 ng/L — ABNORMAL HIGH (ref <18)

## 2020-04-13 SURGERY — TEMPORARY DIALYSIS CATHETER
Anesthesia: Moderate Sedation

## 2020-04-13 MED ORDER — INSULIN ASPART 100 UNIT/ML ~~LOC~~ SOLN
0.0000 [IU] | Freq: Three times a day (TID) | SUBCUTANEOUS | Status: DC
  Administered 2020-04-14 (×3): 1 [IU] via SUBCUTANEOUS
  Administered 2020-04-15 – 2020-04-16 (×3): 2 [IU] via SUBCUTANEOUS
  Administered 2020-04-16: 3 [IU] via SUBCUTANEOUS
  Administered 2020-04-17 (×2): 1 [IU] via SUBCUTANEOUS
  Filled 2020-04-13 (×9): qty 1

## 2020-04-13 MED ORDER — LACTATED RINGERS IV BOLUS
1000.0000 mL | Freq: Once | INTRAVENOUS | Status: AC
  Administered 2020-04-13: 02:00:00 1000 mL via INTRAVENOUS

## 2020-04-13 MED ORDER — ACETAMINOPHEN 325 MG PO TABS: 650.0000 mg | ORAL_TABLET | Freq: Four times a day (QID) | ORAL | Status: DC | PRN

## 2020-04-13 MED ORDER — SODIUM CHLORIDE 0.9 % IV SOLN: INTRAVENOUS | Status: AC

## 2020-04-13 MED ORDER — CALCIUM CHLORIDE 10 % IV SOLN: 1.0000 g | Freq: Once | INTRAVENOUS | Status: DC

## 2020-04-13 MED ORDER — PATIROMER SORBITEX CALCIUM 8.4 G PO PACK
8.4000 g | PACK | Freq: Every day | ORAL | Status: DC
  Administered 2020-04-13: 02:00:00 8.4 g via ORAL
  Filled 2020-04-13: qty 1

## 2020-04-13 MED ORDER — ALBUTEROL SULFATE (2.5 MG/3ML) 0.083% IN NEBU: 10.0000 mg | INHALATION_SOLUTION | Freq: Once | RESPIRATORY_TRACT | Status: DC

## 2020-04-13 MED ORDER — ONDANSETRON HCL 4 MG/2ML IJ SOLN
4.0000 mg | Freq: Once | INTRAMUSCULAR | Status: AC
  Administered 2020-04-13: 01:00:00 4 mg via INTRAVENOUS
  Filled 2020-04-13: qty 2

## 2020-04-13 MED ORDER — DEXTROSE 50 % IV SOLN
1.0000 | Freq: Once | INTRAVENOUS | Status: AC
  Administered 2020-04-13: 50 mL via INTRAVENOUS

## 2020-04-13 MED ORDER — MIDODRINE HCL 5 MG PO TABS
5.0000 mg | ORAL_TABLET | Freq: Three times a day (TID) | ORAL | Status: DC
  Administered 2020-04-13 – 2020-04-17 (×13): 5 mg via ORAL
  Filled 2020-04-13 (×13): qty 1

## 2020-04-13 MED ORDER — LACTATED RINGERS IV BOLUS
1000.0000 mL | Freq: Once | INTRAVENOUS | Status: AC
  Administered 2020-04-13: 01:00:00 1000 mL via INTRAVENOUS

## 2020-04-13 MED ORDER — PROMETHAZINE HCL 25 MG/ML IJ SOLN
12.5000 mg | Freq: Once | INTRAMUSCULAR | Status: AC
  Administered 2020-04-13: 03:00:00 12.5 mg via INTRAVENOUS

## 2020-04-13 MED ORDER — DEXTROSE 50 % IV SOLN: 1.0000 | Freq: Once | INTRAVENOUS | Status: AC

## 2020-04-13 MED ORDER — HEPARIN SODIUM (PORCINE) 5000 UNIT/ML IJ SOLN
5000.0000 [IU] | Freq: Three times a day (TID) | INTRAMUSCULAR | Status: DC
  Administered 2020-04-13 – 2020-04-18 (×14): 5000 [IU] via SUBCUTANEOUS
  Filled 2020-04-13 (×14): qty 1

## 2020-04-13 MED ORDER — ONDANSETRON HCL 4 MG/2ML IJ SOLN
4.0000 mg | Freq: Four times a day (QID) | INTRAMUSCULAR | Status: DC | PRN
  Administered 2020-04-13 – 2020-04-17 (×3): 4 mg via INTRAVENOUS
  Filled 2020-04-13 (×4): qty 2

## 2020-04-13 MED ORDER — CALCIUM GLUCONATE-NACL 1-0.675 GM/50ML-% IV SOLN: 1.0000 g | INTRAVENOUS | Status: DC

## 2020-04-13 MED ORDER — ONDANSETRON HCL 4 MG PO TABS: 4.0000 mg | ORAL_TABLET | Freq: Four times a day (QID) | ORAL | Status: DC | PRN

## 2020-04-13 MED ORDER — ACETAMINOPHEN 650 MG RE SUPP: 650.0000 mg | Freq: Four times a day (QID) | RECTAL | Status: DC | PRN

## 2020-04-13 MED ORDER — PROMETHAZINE HCL 25 MG/ML IJ SOLN
12.5000 mg | Freq: Three times a day (TID) | INTRAMUSCULAR | Status: AC | PRN
  Administered 2020-04-13 – 2020-04-14 (×2): 12.5 mg via INTRAVENOUS
  Filled 2020-04-13 (×3): qty 1

## 2020-04-13 MED ORDER — DEXTROSE 50 % IV SOLN
INTRAVENOUS | Status: AC
  Administered 2020-04-13: 50 mL via INTRAVENOUS
  Filled 2020-04-13: qty 50

## 2020-04-13 MED ORDER — CHLORHEXIDINE GLUCONATE CLOTH 2 % EX PADS
6.0000 | MEDICATED_PAD | Freq: Every day | CUTANEOUS | Status: DC
  Administered 2020-04-13 – 2020-04-16 (×4): 6 via TOPICAL
  Filled 2020-04-13: qty 6

## 2020-04-13 MED ORDER — SODIUM CHLORIDE 0.9% FLUSH
3.0000 mL | Freq: Two times a day (BID) | INTRAVENOUS | Status: DC
  Administered 2020-04-13 – 2020-04-17 (×5): 3 mL via INTRAVENOUS

## 2020-04-13 NOTE — ED Notes (Addendum)
pt able to sip a little OJ  Pt denies urge to urinate

## 2020-04-13 NOTE — Progress Notes (Signed)
This note also relates to the following rows which could not be included: Pulse Rate - Cannot attach notes to unvalidated device data Resp - Cannot attach notes to unvalidated device data  Hd completed  

## 2020-04-13 NOTE — ED Notes (Signed)
Pt reports nauseated

## 2020-04-13 NOTE — Consult Note (Signed)
[address] [place], [place] [address] Phone [phone]. Fax [phone]  Date: 04/13/2020                  Patient Name:  Jasmine Davidson  MRN: [id number]  DOB: [birth year]  Age / Sex: 64 y.o., female         PCP: Alene Mires Elyse Jarvis, MD                 Service Requesting Consult:  Internal medicine/ [facility]                 Reason for Consult:  Hyperkalemia and acute renal failure            History of Present Illness: Patient is a 64 y.o. female with medical problems of, coronary disease, diabetes, hyperlipidemia, who was admitted to [facility] on 04/13/2020 for evaluation of Acute renal failure superimposed on stage 3 chronic kidney disease, unspecified acute renal failure type, unspecified whether stage 3a or 3b CKD ([place]) [N17.9, N18.30] Hyperkalemia [E87.5]  Patient is in the room with her husband They report that for the past week or so she has been having nausea and vomiting off and on.  She does not know of a precipitating event  Patient usually follows with Dr. Radene Knee from [facility] nephrology for CKD She has diabetes diagnosed in her 01S complicated by diabetic retinopathy and neuropathy.  Tested positive for Covid in December 2020 but did not require hospitalization Patient baseline creatinine from March 3 is 1.42, GFR 40  In the ER she was found to have elevated creatinine of 9.6, potassium of 5.7 Repeat labs show creatinine of 9.21, potassium of 7.2 Urgent nephrology evaluation has been requested for dialysis   Medications: Outpatient medications: (Not in a hospital admission)   Current medications: Current Facility-Administered Medications  Medication Dose Route Frequency Provider Last Rate Last Admin  . 0.9 %  sodium chloride infusion   Intravenous Continuous Opyd, Ilene Qua, MD      . acetaminophen (TYLENOL) tablet 650 mg  650 mg Oral Q6H PRN Opyd, Ilene Qua, MD       Or  . acetaminophen (TYLENOL) suppository 650 mg  650 mg Rectal  Q6H PRN Opyd, Ilene Qua, MD      . albuterol (PROVENTIL) (2.5 MG/3ML) 0.083% nebulizer solution 10 mg  10 mg Nebulization Once [facility], MD      . calcium gluconate 1 g/ 50 mL sodium chloride IVPB  1 g Intravenous STAT [facility], MD      . Chlorhexidine Gluconate Cloth 2 % PADS 6 each  6 each Topical Q0600 Gerrett Loman, MD      . heparin injection 5,000 Units  5,000 Units Subcutaneous Q8H Opyd, Timothy S, MD      . insulin aspart (novoLOG) injection 0-6 Units  0-6 Units Subcutaneous TID WC Opyd, Ilene Qua, MD      . ondansetron (ZOFRAN) tablet 4 mg  4 mg Oral Q6H PRN Opyd, Ilene Qua, MD       Or  . ondansetron (ZOFRAN) injection 4 mg  4 mg Intravenous Q6H PRN Opyd, Ilene Qua, MD   4 mg at 04/13/20 0436  . sodium chloride flush (NS) 0.9 % injection 3 mL  3 mL Intravenous Q12H Opyd, Ilene Qua, MD       Current Outpatient Medications  Medication Sig Dispense Refill  . aspirin (ASPIRIN 81) 81 MG EC tablet Take 81 mg by mouth daily.     Marland Kitchen  atorvastatin (LIPITOR) 40 MG tablet TAKE ONE TABLET BY MOUTH EVERY DAY (Patient taking differently: Take 40 mg by mouth at bedtime. ) 30 tablet 11  . empagliflozin (JARDIANCE) 10 MG TABS tablet Take 10 mg by mouth daily.    . furosemide (LASIX) 40 MG tablet Take 20 mg by mouth daily.    Marland Kitchen gabapentin (NEURONTIN) 300 MG capsule Take 300 mg by mouth 3 (three) times daily.     . hydrochlorothiazide (HYDRODIURIL) 25 MG tablet Take 12.5 mg by mouth daily.    . insulin NPH-insulin regular (HUMULIN 70/30) (70-30) 100 UNIT/ML injection Inject 15-20 Units into the skin See admin instructions. Inject 20u under the skin every morning and inject 15u under the skin every night    . liraglutide (VICTOZA) 18 MG/3ML SOPN Inject 0.6 mg into the skin daily.    Marland Kitchen lisinopril (ZESTRIL) 40 MG tablet Take 40 mg by mouth daily.    . metFORMIN (GLUCOPHAGE) 1000 MG tablet Take 1,000 mg by mouth 2 (two) times daily.    . metoprolol succinate (TOPROL-XL) 25  MG 24 hr tablet Take 50 mg by mouth daily.    . verapamil (CALAN-SR) 120 MG CR tablet Take 120 mg by mouth daily.    . Vitamin D, Ergocalciferol, (DRISDOL) 1.25 MG (50000 UNIT) CAPS capsule Take 50,000 Units by mouth once a week.        Allergies: Allergies  Allergen Reactions  . Pregabalin       Past Medical History: Past Medical History:  Diagnosis Date  . CAD (coronary artery disease)   . Diabetes mellitus   . HTN (hypertension)   . Obesity      Past Surgical History: Past Surgical History:  Procedure Laterality Date  . BLADDER REPAIR W/ CESAREAN SECTION  1981  . BREAST BIOPSY Right 12/17/2014   negative stereotactic  . CARDIAC CATHETERIZATION    . cyst taken off finger  2011  . TOE SURGERY  02/08/2013     Family History: Family History  Problem Relation Age of Onset  . Diabetes Father   . Heart failure Father        congestive  . Hypertension Father   . Lupus Father   . COPD Mother      Social History: Social History   Socioeconomic History  . Marital status: Married    Spouse name: Not on file  . Number of children: Not on file  . Years of education: Not on file  . Highest education level: Not on file  Occupational History  . Not on file  Tobacco Use  . Smoking status: Never Smoker  . Smokeless tobacco: Never Used  . Tobacco comment: tobacco use- no   Substance and Sexual Activity  . Alcohol use: No  . Drug use: No  . Sexual activity: Not on file  Other Topics Concern  . Not on file  Social History Narrative   Full time. Does not regularly exercise.    Social Determinants of Health   Financial Resource Strain:   . Difficulty of Paying Living Expenses:   Food Insecurity:   . Worried About Charity fundraiser in the Last Year:   . Arboriculturist in the Last Year:   Transportation Needs:   . Film/video editor (Medical):   Marland Kitchen Lack of Transportation (Non-Medical):   Physical Activity:   . Days of Exercise per Week:   . Minutes of  Exercise per Session:   Stress:   . Feeling  of Stress :   Social Connections:   . Frequency of Communication with Friends and Family:   . Frequency of Social Gatherings with Friends and Family:   . Attends Religious Services:   . Active Member of Clubs or Organizations:   . Attends Archivist Meetings:   Marland Kitchen Marital Status:   Intimate Partner Violence:   . Fear of Current or Ex-Partner:   . Emotionally Abused:   Marland Kitchen Physically Abused:   . Sexually Abused:      Review of Systems: Gen: Denies any fevers or chills.  Reports generalized malaise HEENT: Has diabetic retinopathy.  Denies any acute vision changes or hearing problems at present CV: No chest pain or shortness of breath reported.  No leg edema reported. Resp: No cough or shortness of breath.  No hemoptysis reported GI: Nausea and vomiting.  No blood in the stool GU : Denies hematuria or kidney stones.  Followed by [facility] nephrology as outpatient for CKD MS: Has a boot in the left foot.  No acute complaints Derm:    No complaint Psych: No complaints Heme: No complaints Neuro: No complaints Endocrine.  No complaints  Vital Signs: Blood pressure 130/76, pulse 87, temperature 97.8 F (36.6 C), temperature source Oral, resp. rate 17, height 5\' 6"  (1.676 m), weight 109.8 kg, last menstrual period 09/12/1999, SpO2 98 %.  No intake or output data in the 24 hours ending 04/13/20 0946  Weight trends: Filed Weights   04/12/20 2145  Weight: 109.8 kg    Physical Exam: General:  Ill-appearing, laying in the bed  HEENT  posterior pharyngeal erythema, no exudate, dry oral mucous membranes  Neck:  Supple, no masses  Lungs:  Normal breathing effort on room air, clear to auscultation  Heart::  Regular rhythm, sinus on telemetry  Abdomen:  Soft, mild midepigastric tenderness from vomiting  Extremities:  No peripheral edema  Neurologic:  Alert, oriented  Skin:  No acute rashes  Access:   Foley:        Lab  results: Basic Metabolic Panel: Recent Labs  Lab 04/12/20 2149 04/13/20 0616  NA 134* 134*  K 5.7* 7.2*  CL 97* 95*  CO2 19* 12*  GLUCOSE 74 155*  BUN 99* 102*  CREATININE 9.26* 9.21*  CALCIUM 9.2 8.8*    Liver Function Tests: No results for input(s): AST, ALT, ALKPHOS, BILITOT, PROT, ALBUMIN in the last 168 hours. No results for input(s): LIPASE, AMYLASE in the last 168 hours. No results for input(s): AMMONIA in the last 168 hours.  CBC: Recent Labs  Lab 04/12/20 2149 04/13/20 0616  WBC 8.3 9.8  HGB 14.2 13.9  HCT 43.0 43.4  MCV 93.1 95.6  PLT 185 184    Cardiac Enzymes: No results for input(s): CKTOTAL, TROPONINI in the last 168 hours.  BNP: Invalid input(s): POCBNP  CBG: Recent Labs  Lab 04/13/20 0424 04/13/20 0445 04/13/20 0517 04/13/20 0559 04/13/20 0749  GLUCAP 15* 81 61* 168* 155*    Microbiology: Recent Results (from the past 720 hour(s))  Respiratory Panel by RT PCR (Flu A&B, Covid) - Nasopharyngeal Swab     Status: None   Collection Time: 04/13/20 12:40 AM   Specimen: Nasopharyngeal Swab  Result Value Ref Range Status   SARS Coronavirus 2 by RT PCR NEGATIVE NEGATIVE Final    Comment: (NOTE) SARS-CoV-2 target nucleic acids are NOT DETECTED. The SARS-CoV-2 RNA is generally detectable in upper respiratoy specimens during the acute phase of infection. The lowest concentration of SARS-CoV-2  viral copies this assay can detect is 131 copies/mL. A negative result does not preclude SARS-Cov-2 infection and should not be used as the sole basis for treatment or other patient management decisions. A negative result may occur with  improper specimen collection/handling, submission of specimen other than nasopharyngeal swab, presence of viral mutation(s) within the areas targeted by this assay, and inadequate number of viral copies (<131 copies/mL). A negative result must be combined with clinical observations, patient history, and epidemiological  information. The expected result is Negative. Fact Sheet for Patients:  PinkCheek.be Fact Sheet for Healthcare Providers:  GravelBags.it This test is not yet ap proved or cleared by the [place] FDA and  has been authorized for detection and/or diagnosis of SARS-CoV-2 by FDA under an Emergency Use Authorization (EUA). This EUA will remain  in effect (meaning this test can be used) for the duration of the COVID-19 declaration under Section 564(b)(1) of the Act, 21 U.S.C. section 360bbb-3(b)(1), unless the authorization is terminated or revoked sooner.    Influenza A by PCR NEGATIVE NEGATIVE Final   Influenza B by PCR NEGATIVE NEGATIVE Final    Comment: (NOTE) The Xpert Xpress SARS-CoV-2/FLU/RSV assay is intended as an aid in  the diagnosis of influenza from Nasopharyngeal swab specimens and  should not be used as a sole basis for treatment. Nasal washings and  aspirates are unacceptable for Xpert Xpress SARS-CoV-2/FLU/RSV  testing. Fact Sheet for Patients: PinkCheek.be Fact Sheet for Healthcare Providers: GravelBags.it This test is not yet approved or cleared by the [place] FDA and  has been authorized for detection and/or diagnosis of SARS-CoV-2 by  FDA under an Emergency Use Authorization (EUA). This EUA will remain  in effect (meaning this test can be used) for the duration of the  Covid-19 declaration under Section 564(b)(1) of the Act, 21  U.S.C. section 360bbb-3(b)(1), unless the authorization is  terminated or revoked. Performed at [facility], [address] [place], [place] [address]      Coagulation Studies: No results for input(s): LABPROT, INR in the last 72 hours.  Urinalysis: No results for input(s): COLORURINE, LABSPEC, PHURINE, GLUCOSEU, HGBUR, BILIRUBINUR, KETONESUR, PROTEINUR, UROBILINOGEN, NITRITE, LEUKOCYTESUR in the last  72 hours.  Invalid input(s): APPERANCEUR      Imaging: US Renal  Result Date: 04/13/2020 CLINICAL DATA:  64 year old female with renal failure. EXAM: RENAL / URINARY TRACT ULTRASOUND COMPLETE COMPARISON:  Renal ultrasound dated 08/08/2017. FINDINGS: Right Kidney: Renal measurements: 10.6 x 5.6 x 5.8 cm = volume: 180 mL. Mild parenchyma atrophy. Normal echogenicity. No hydronephrosis or shadowing stone. Left Kidney: Renal measurements: 11.6 x 5.9 x 5.6 cm = volume: 198 mL. Mild parenchyma atrophy and cortical thinning. Normal echogenicity. No hydronephrosis or shadowing stone. Bladder: The urinary bladder is not well visualized. Other: None. IMPRESSION: No hydronephrosis or shadowing stone. Electronically Signed   By: Anner Crete M.D.   On: 04/13/2020 02:43     Assessment & Plan: Pt is a 64 y.o. Caucasian  female with  Hypertension Diabetes with retinopathy and nephropathy Coronary disease Chronic kidney disease stage IIIb Chronic foot ulcers Obesity,  was admitted on 04/13/2020 with Acute renal failure superimposed on stage 3 chronic kidney disease, unspecified acute renal failure type, unspecified whether stage 3a or 3b CKD ([place]) [N17.9, N18.30] Hyperkalemia [E87.5]  #Acute kidney injury with severe hyperkalemia #Chronic kidney disease stage IIIb.  Baseline creatinine 1.42/GFR 40 from March 3 Patient is anuric at present.  Urinalysis not available renal ultrasound is negative for obstruction  Outpatient medications -Jardiance, furosemide, hydrochlorothiazide, lisinopril, Metformin should be held in current setting of acute kidney injury Agree with IV volume resuscitation with normal saline Avoid potassium containing IV fluids Agree with shifting measures for hyperkalemia Discussed with patient and her husband that because she is anuric in the setting of severe hyperkalemia, she is at risk of cardiac arrhythmias She has not responded adequately to IV fluid resuscitation Discussed  the situation and recommended urgent hemodialysis treatment to correct hyperkalemia-all questions were answered-they have agreed to proceed  -Have discussed with vascular surgery to place temporary dialysis catheter urgently -We will place dialysis orders and dialysis staff has been alerted -Further recommendations based on hospital course  #Diabetes type 2 with complications Lab Results  Component Value Date   HGBA1C 7.0 (H) 08/09/2017   Patient is critically ill and is at high risk of complications         LOS: 0 Orville Mena 5/3/20219:46 AM    Note: This note was prepared with Dragon dictation. Any transcription errors are unintentional

## 2020-04-13 NOTE — ED Provider Notes (Signed)
[facility] Emergency Department Provider Note   ____________________________________________   First MD Initiated Contact with Patient 04/13/20 0013     (approximate)  I have reviewed the triage vital signs and the nursing notes.   HISTORY  Chief Complaint Dizziness    HPI Jasmine Davidson is a 64 y.o. female with past medical history of hypertension, diabetes, and CAD who presents to the ED complaining of dizziness and weakness.  Patient reports that she for started feeling bad 1 week ago with nausea and multiple episodes of vomiting.  This is been associated with abdominal cramping, which she states comes on after she vomits.  She has not had any diarrhea and denies any fevers.  She has not had any dysuria or hematuria, but does state that she has been making less urine.  She has had a hard time tolerating any liquids or solids.  Over the past couple of days she has felt increasingly dizzy and lightheaded.        Past Medical History:  Diagnosis Date  . CAD (coronary artery disease)   . Diabetes mellitus   . HTN (hypertension)   . Obesity     Patient Active Problem List   Diagnosis Date Noted  . Acute renal failure superimposed on stage 3 chronic kidney disease ([place]) 04/13/2020  . Hyperkalemia   . Cellulitis 08/08/2017  . Insulin-requiring or dependent type II diabetes mellitus ([place]) 11/11/2010  . HYPERLIPIDEMIA-MIXED 11/11/2010  . HTN (hypertension) 11/11/2010  . CAD, NATIVE VESSEL 11/11/2010  . 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 CAROTID ARTERY STENOSIS, WITHOUT INFARCTION 11/11/2010  . SYNCOPE AND COLLAPSE 11/11/2010    Past Surgical History:  Procedure Laterality Date  . BLADDER REPAIR W/ CESAREAN SECTION  1981  . BREAST BIOPSY Right 12/17/2014   negative stereotactic  . CARDIAC CATHETERIZATION    . cyst taken off finger  2011  . TOE SURGERY  02/08/2013    Prior to Admission medications   Medication Sig Start Date End Date  Taking? Authorizing Provider  aspirin (ASPIR-81) 81 MG EC tablet Take 81 mg by mouth daily.      [provider]  atorvastatin (LIPITOR) 40 MG tablet TAKE ONE TABLET BY MOUTH EVERY DAY Patient taking differently: Take 40 mg by mouth at bedtime.  02/13/14   Minna Merritts, MD  gabapentin (NEURONTIN) 300 MG capsule Take 300 mg by mouth 2 (two) times daily.    [provider]  insulin NPH-insulin regular (HUMULIN 70/30) (70-30) 100 UNIT/ML injection Inject 10-15 Units into the skin 2 (two) times daily. 15 units every morning and 10 units every evening    [provider]  lisinopril-hydrochlorothiazide (PRINZIDE,ZESTORETIC) 20-12.5 MG per tablet TAKE TWO TABLETS BY MOUTH ONCE DAILY Patient taking differently: Take 2 tablets by mouth daily.  02/13/14   Minna Merritts, MD  metFORMIN (GLUCOPHAGE) 1000 MG tablet Take 1,000 mg by mouth 2 (two) times daily.    [provider]  metoprolol succinate (TOPROL-XL) 50 MG 24 hr tablet Take 1 tablet (50 mg total) by mouth daily. 09/16/13   Minna Merritts, MD    Allergies Pregabalin  Family History  Problem Relation Age of Onset  . Diabetes Father   . Heart failure Father        congestive  . Hypertension Father   . Lupus Father   . COPD Mother     Social History Social History   Tobacco Use  . Smoking status: Never Smoker  .  Smokeless tobacco: Never Used  . Tobacco comment: tobacco use- no   Substance Use Topics  . Alcohol use: No  . Drug use: No    Review of Systems  Constitutional: No fever/chills.  Positive for dizziness. Eyes: No visual changes. ENT: No sore throat. Cardiovascular: Denies chest pain. Respiratory: Denies shortness of breath. Gastrointestinal: No abdominal pain.  Positive for nausea and vomiting.  No diarrhea.  No constipation. Genitourinary: Negative for dysuria. Musculoskeletal: Negative for back pain. Skin: Negative for rash. Neurological: Negative for headaches, focal  weakness or numbness.  ____________________________________________   PHYSICAL EXAM:  VITAL SIGNS: ED Triage Vitals  Enc Vitals Group     BP 04/13/20 0011 (!) 155/79     Pulse Rate 04/13/20 0011 80     Resp 04/13/20 0011 16     Temp 04/13/20 0011 97.8 F (36.6 C)     Temp Source 04/13/20 0011 Oral     SpO2 04/13/20 0011 100 %     Weight 04/12/20 2145 242 lb (109.8 kg)     Height 04/12/20 2145 5\' 6"  (1.676 m)     Head Circumference --      Peak Flow --      Pain Score 04/12/20 2144 0     Pain Loc --      Pain Edu? --      Excl. in [place]? --     Constitutional: Alert and oriented. Eyes: Conjunctivae are normal. Head: Atraumatic. Nose: No congestion/rhinnorhea. Mouth/Throat: Mucous membranes are dry. Neck: Normal ROM Cardiovascular: Normal rate, regular rhythm. Grossly normal heart sounds. Respiratory: Normal respiratory effort.  No retractions. Lungs CTAB. Gastrointestinal: Soft and nontender. No distention. Genitourinary: deferred Musculoskeletal: No lower extremity tenderness nor edema. Neurologic:  Normal speech and language. No gross focal neurologic deficits are appreciated. Skin:  Skin is warm, dry and intact. No rash noted. Psychiatric: Mood and affect are normal. Speech and behavior are normal.  ____________________________________________   LABS (all labs ordered are listed, but only abnormal results are displayed)  Labs Reviewed  BASIC METABOLIC PANEL - Abnormal; Notable for the following components:      Result Value   Sodium 134 (*)    Potassium 5.7 (*)    Chloride 97 (*)    CO2 19 (*)    BUN 99 (*)    Creatinine, Ser 9.26 (*)    GFR calc non Af Amer 4 (*)    GFR calc Af Amer 5 (*)    Anion gap 18 (*)    All other components within normal limits  TROPONIN I (HIGH SENSITIVITY) - Abnormal; Notable for the following components:   Troponin I (High Sensitivity) 25 (*)    All other components within normal limits  TROPONIN I (HIGH SENSITIVITY) -  Abnormal; Notable for the following components:   Troponin I (High Sensitivity) 32 (*)    All other components within normal limits  RESPIRATORY PANEL BY RT PCR (FLU A&B, COVID)  CBC  URINALYSIS, COMPLETE (UACMP) WITH MICROSCOPIC  SODIUM, URINE, RANDOM  CREATININE, URINE, RANDOM  UREA NITROGEN, URINE  HIV ANTIBODY (ROUTINE TESTING W REFLEX)  HEMOGLOBIN J2E  BASIC METABOLIC PANEL  CBC   ____________________________________________  EKG  ED ECG REPORT I, Blake Divine, the attending physician, personally viewed and interpreted this ECG.   Date: 04/13/2020  EKG Time: 21:45  Rate: 85  Rhythm: normal sinus rhythm  Axis: LAD  Intervals:none  ST&T Change: None   PROCEDURES  Procedure(s) performed (including Critical Care):  Procedures  ____________________________________________   INITIAL IMPRESSION / ASSESSMENT AND PLAN / ED COURSE       64 year old female with history of hypertension, diabetes, and CAD presents to the ED complaining of increased dizziness and lightheadedness after dealing with nausea and vomiting throughout the week.  She does appear dehydrated and lab work is concerning for severe AKI.  Creatinine has risen from baseline of about 1.4 to greater than 9 along with BUN of 99.  Potassium is mildly elevated at 5.7 and she does not have any apparent EKG changes.  We will hydrate with IV fluids and treat with Zofran.  No focal tenderness on her abdominal exam.  Case discussed with Dr. Candiss Norse of nephrology, who recommends dose of Veltassa in addition to IV fluid hydration.  Case was discussed with hospitalist for admission.      ____________________________________________   FINAL CLINICAL IMPRESSION(S) / ED DIAGNOSES  Final diagnoses:  AKI (acute kidney injury) ([place])  Dehydration     ED Discharge Orders    None       Note:  This document was prepared using Dragon voice recognition software and may include unintentional dictation errors.     Blake Divine, MD 04/13/20 0157

## 2020-04-13 NOTE — Progress Notes (Signed)
This note also relates to the following rows which could not be included: Resp - Cannot attach notes to unvalidated device data BP - Cannot attach notes to unvalidated device data  55cc NS bolus, VO dr Candiss Norse

## 2020-04-13 NOTE — ED Notes (Signed)
Ready bed @ [id number]. Patient going to room 112. Spoke with RN Mickel Baas.

## 2020-04-13 NOTE — Op Note (Signed)
  OPERATIVE NOTE   PROCEDURE: 1. Ultrasound guidance for vascular access right femoral vein 2. Placement of a 30 cm triple lumen dialysis catheter right femoral vein  PRE-OPERATIVE DIAGNOSIS: 1. Acute renal failure 2. CKD stage 3 3. Hyperkalemia  POST-OPERATIVE DIAGNOSIS: Same  SURGEON: Leotis Pain, MD  ASSISTANT(S): None  ANESTHESIA: local  ESTIMATED BLOOD LOSS: Minimal   FINDING(S): 1. None  SPECIMEN(S): None  INDICATIONS:  Patient is a 64 y.o.female who presents with AKI on top of CKD stage 3.  She has severe hyperkalemia.  Risks and benefits were discussed, and informed consent was obtained..  DESCRIPTION: After obtaining full informed written consent, the patient was laid flat in the bed. The right groin was sterilely prepped and draped in a sterile surgical field was created. The right femoral vein was visualized with ultrasound and found to be widely patent. It was then accessed under direct guidance without difficulty with a Seldinger needle and a permanent image was recorded. A J-wire was then placed. After skin nick and dilatation, a 30 cm triple lumen dialysis catheter was placed over the wire and the wire was removed. The lumens withdrew dark red nonpulsatile blood and flushed easily with sterile saline. The catheter was secured to the skin with 3 nylon sutures. Sterile dressing was placed.  COMPLICATIONS: None  CONDITION: Stable  Leotis Pain 04/13/2020 1:08 PM  This note was created with Dragon Medical transcription system. Any errors in dictation are purely unintentional.

## 2020-04-13 NOTE — Progress Notes (Signed)
Briefly patient is a 64 year old female who was admitted earlier today and found to be in acute renal failure with creatinine of 9.  On repeat laboratory data patient was noted to have a potassium of 7.3.  She underwent emergent dialysis and at present feels better with significant decrease in her nausea and vomiting as well as decreased abdominal pain.  Work-up for cause of acute renal failure is underway.  Patient is usually followed by Dr. Twanna Hy of [facility] who apparently comes to [facility] once a week.

## 2020-04-13 NOTE — ED Notes (Addendum)
Pt vomiting 300 mls some frothy blood noted

## 2020-04-13 NOTE — Consult Note (Signed)
[facility] Consult Note  MRN : [id number]  Jasmine Davidson is a 64 y.o. (Oct 16, 1956) female who presents with chief complaint of  Chief Complaint  Patient presents with  . Dizziness   History of Present Illness:  Patient is admitted to the hospital with acute on chronic renal failure. The patient is critically ill with hyperkalemia. The nephrology service has decided to initiate dialysis at this time, and we are asked to place a temporary dialysis catheter for immediate dialysis use.    Patient endorses a history of progressively worsening nausea and vomiting. Patient usually follows with Dr. Radene Knee from [facility] nephrology for CKD. She has diabetes diagnosed in her 30J complicated by diabetic retinopathy and neuropathy.  Tested positive for Covid in December 2020 but did not require hospitalization. Patient baseline creatinine from March 3 is 1.42, GFR 40  Vascular surgery was consulted by Dr. Candiss Norse for placement of a temporary dialysis catheter.  Current Facility-Administered Medications  Medication Dose Route Frequency Provider Last Rate Last Admin  . 0.9 %  sodium chloride infusion   Intravenous Continuous Opyd, Ilene Qua, MD      . acetaminophen (TYLENOL) tablet 650 mg  650 mg Oral Q6H PRN Opyd, Ilene Qua, MD       Or  . acetaminophen (TYLENOL) suppository 650 mg  650 mg Rectal Q6H PRN Opyd, Ilene Qua, MD      . albuterol (PROVENTIL) (2.5 MG/3ML) 0.083% nebulizer solution 10 mg  10 mg Nebulization Once Bonnell Public Tublu, MD      . calcium gluconate 1 g/ 50 mL sodium chloride IVPB  1 g Intravenous STAT Bonnell Public Tublu, MD      . Chlorhexidine Gluconate Cloth 2 % PADS 6 each  6 each Topical Q0600 Murlean Iba, MD   6 each at 04/13/20 1024  . heparin injection 5,000 Units  5,000 Units Subcutaneous Q8H Opyd, Timothy S, MD      . insulin aspart (novoLOG) injection 0-6 Units  0-6 Units Subcutaneous TID WC Opyd, Ilene Qua, MD      .  ondansetron (ZOFRAN) tablet 4 mg  4 mg Oral Q6H PRN Opyd, Ilene Qua, MD       Or  . ondansetron (ZOFRAN) injection 4 mg  4 mg Intravenous Q6H PRN Opyd, Ilene Qua, MD   4 mg at 04/13/20 0436  . promethazine (PHENERGAN) injection 12.5 mg  12.5 mg Intravenous Q8H PRN Bonnell Public Tublu, MD   12.5 mg at 04/13/20 1139  . sodium chloride flush (NS) 0.9 % injection 3 mL  3 mL Intravenous Q12H Opyd, Ilene Qua, MD       Past Medical History:  Diagnosis Date  . CAD (coronary artery disease)   . Diabetes mellitus   . HTN (hypertension)   . Obesity    Past Surgical History:  Procedure Laterality Date  . BLADDER REPAIR W/ CESAREAN SECTION  1981  . BREAST BIOPSY Right 12/17/2014   negative stereotactic  . CARDIAC CATHETERIZATION    . cyst taken off finger  2011  . TOE SURGERY  02/08/2013   Social History Social History   Tobacco Use  . Smoking status: Never Smoker  . Smokeless tobacco: Never Used  . Tobacco comment: tobacco use- no   Substance Use Topics  . Alcohol use: No  . Drug use: No   Family History Family History  Problem Relation Age of Onset  . Diabetes Father   . Heart failure Father  congestive  . Hypertension Father   . Lupus Father   . COPD Mother   Denies family history of peripheral artery disease, venous disease or renal disease.  Allergies  Allergen Reactions  . Pregabalin    REVIEW OF SYSTEMS (Negative unless checked)  Constitutional: [] Weight loss  [] Fever  [] Chills Cardiac: [] Chest pain   [] Chest pressure   [] Palpitations   [] Shortness of breath when laying flat   [] Shortness of breath at rest   [] Shortness of breath with exertion. Vascular:  [] Pain in legs with walking   [] Pain in legs at rest   [] Pain in legs when laying flat   [] Claudication   [] Pain in feet when walking  [] Pain in feet at rest  [] Pain in feet when laying flat   [] History of DVT   [] Phlebitis   [x] Swelling in legs   [] Varicose veins   [] Non-healing ulcers Pulmonary:   [] Uses  home oxygen   [] Productive cough   [] Hemoptysis   [] Wheeze  [] COPD   [] Asthma Neurologic:  [] Dizziness  [] Blackouts   [] Seizures   [] History of stroke   [] History of TIA  [] Aphasia   [] Temporary blindness   [] Dysphagia   [] Weakness or numbness in arms   [] Weakness or numbness in legs Musculoskeletal:  [] Arthritis   [] Joint swelling   [] Joint pain   [] Low back pain Hematologic:  [] Easy bruising  [] Easy bleeding   [] Hypercoagulable state   [x] Anemic  [] Hepatitis Gastrointestinal:  [] Blood in stool   [] Vomiting blood  [] Gastroesophageal reflux/heartburn   [] Difficulty swallowing. Genitourinary:  [x] Chronic kidney disease   [] Difficult urination  [] Frequent urination  [] Burning with urination   [] Blood in urine Skin:  [] Rashes   [] Ulcers   [] Wounds Psychological:  [] History of anxiety   []  History of major depression.   Physical Examination  Vitals:   04/13/20 0900 04/13/20 0930 04/13/20 1004 04/13/20 1008  BP: 112/61 130/76  (!) 118/54  Pulse:   87   Resp: 17 17    Temp:   97.9 F (36.6 C)   TempSrc:   Oral   SpO2:   100%   Weight:   115.9 kg   Height:   5\' 6"  (1.676 m)    Body mass index is 41.24 kg/m. Gen: WD/WN Head: [place]/AT, No temporalis wasting Ear/Nose/Throat: Hearing grossly intact, nares w/o erythema or drainage Eyes: Sclera non-icteric, conjunctiva clear Neck: Supple, no nuchal rigidity.  No JVD.  Pulmonary:  Good air movement, clear to auscultation bilaterally.  Cardiac: RRR, normal S1, S2, no Murmurs, rubs or gallops. Vascular: 2+ radial pulse Gastrointestinal: soft, non-tender/non-distended. No guarding/reflex.  Musculoskeletal: M/S 5/5 throughout.  Extremities without ischemic changes.  No deformity or atrophy. Mild edema in the lower extremities bilaterally Neurologic:  Psychiatric: Difficult to assess due to the severity of patient's illness. Dermatologic: No rashes or ulcers noted.   Lymph : No Cervical, Axillary, or Inguinal lymphadenopathy.  CBC Lab Results   Component Value Date   WBC 9.8 04/13/2020   HGB 13.9 04/13/2020   HCT 43.4 04/13/2020   MCV 95.6 04/13/2020   PLT 184 04/13/2020   BMET    Component Value Date/Time   NA 137 04/13/2020 1126   NA 137 05/31/2014 1603   K 6.9 (HH) 04/13/2020 1126   K 4.4 05/31/2014 1603   CL 94 (L) 04/13/2020 1126   CL 105 05/31/2014 1603   CO2 12 (L) 04/13/2020 1126   CO2 26 05/31/2014 1603   GLUCOSE 163 (H) 04/13/2020 1126   GLUCOSE 137 (  H) 05/31/2014 1603   BUN 105 (H) 04/13/2020 1126   BUN 26 (H) 05/31/2014 1603   CREATININE 9.43 (H) 04/13/2020 1126   CREATININE 0.94 05/31/2014 1603   CALCIUM 8.9 04/13/2020 1126   CALCIUM 9.6 05/31/2014 1603   GFRNONAA 4 (L) 04/13/2020 1126   GFRNONAA >60 05/31/2014 1603   GFRAA 5 (L) 04/13/2020 1126   GFRAA >60 05/31/2014 1603   Estimated Creatinine Clearance: 7.9 mL/min (A) (by C-G formula based on SCr of 9.43 mg/dL (H)).  COAG No results found for: INR, PROTIME  Radiology US Renal  Result Date: 04/13/2020 CLINICAL DATA:  64 year old female with renal failure. EXAM: RENAL / URINARY TRACT ULTRASOUND COMPLETE COMPARISON:  Renal ultrasound dated 08/08/2017. FINDINGS: Right Kidney: Renal measurements: 10.6 x 5.6 x 5.8 cm = volume: 180 mL. Mild parenchyma atrophy. Normal echogenicity. No hydronephrosis or shadowing stone. Left Kidney: Renal measurements: 11.6 x 5.9 x 5.6 cm = volume: 198 mL. Mild parenchyma atrophy and cortical thinning. Normal echogenicity. No hydronephrosis or shadowing stone. Bladder: The urinary bladder is not well visualized. Other: None. IMPRESSION: No hydronephrosis or shadowing stone. Electronically Signed   By: Anner Crete M.D.   On: 04/13/2020 02:43   US Carotid Bilateral  Result Date: 03/31/2020 CLINICAL DATA:  Dizziness, hypertension, hyperlipidemia and diabetes EXAM: BILATERAL CAROTID DUPLEX ULTRASOUND TECHNIQUE: Pearline Cables scale imaging, color Doppler and duplex ultrasound were performed of bilateral carotid and vertebral  arteries in the neck. COMPARISON:  None. FINDINGS: Criteria: Quantification of carotid stenosis is based on velocity parameters that correlate the residual internal carotid diameter with NASCET-based stenosis levels, using the diameter of the distal internal carotid lumen as the denominator for stenosis measurement. The following velocity measurements were obtained: RIGHT ICA: 119/35 cm/sec CCA: 38/10 cm/sec SYSTOLIC ICA/CCA RATIO:  1.3 ECA: 223 cm/sec LEFT ICA: 85/20 cm/sec CCA: 175/10 cm/sec SYSTOLIC ICA/CCA RATIO:  0.6 ECA: 104 cm/sec RIGHT CAROTID ARTERY: Moderate echogenic shadowing plaque formation. No hemodynamically significant right ICA stenosis, velocity elevation, or turbulent flow. Degree of narrowing less than 50%. RIGHT VERTEBRAL ARTERY:  Antegrade LEFT CAROTID ARTERY: Similar scattered mild echogenic plaque formation. No hemodynamically significant left ICA stenosis, velocity elevation, or turbulent flow. LEFT VERTEBRAL ARTERY:  Antegrade IMPRESSION: Bilateral carotid atherosclerosis. No hemodynamically significant ICA stenosis. Degree of narrowing less than 50% bilaterally by ultrasound criteria. Patent antegrade vertebral flow bilaterally Electronically Signed   By: Jerilynn Mages.  Shick M.D.   On: 03/31/2020 15:36   Assessment/Plan Patient is admitted to the hospital with acute on chronic renal failure. The patient is critically ill with hyperkalemia. The nephrology service has decided to initiate dialysis at this time, and we are asked to place a temporary dialysis catheter for immediate dialysis use.    1. Acute on Chronic Renal Failure: We will proceed with temporary dialysis catheter placement at this time.  Risks and benefits discussed with patient and/or family, and the catheter will be placed to allow immediate initiation of dialysis.  If the patient's renal function does not improve throughout the hospital course, we will be happy to place a tunneled dialysis catheter for long term use prior to  discharge.    2. Hyperkalemia: Hopefully will start to normalize with initiation of dialysis.   3.  Diabetes: On appropriate medications. Encouraged good control as its slows the progression of atherosclerotic disease.  4. Hyperlipidemia: On ASA and statin for medical management. Encouraged good control as its slows the progression of atherosclerotic disease.  Discussed with Dr. Mayme Genta, PA-C  04/13/2020 1:22 PM

## 2020-04-13 NOTE — ED Notes (Signed)
Patient reports nausea. Small amount of emesis noted in bag. Patient not due for antiemetic until 1030. MD notified to request new order. Waiting on response at this time.

## 2020-04-13 NOTE — H&P (Signed)
History and Physical    Jasmine Davidson VQQ:[id number] DOB: July 15, 1956 DOA: 04/13/2020  PCP: Theotis Burrow, MD   Patient coming from: Home   Chief Complaint: Lightheadedness, N/V, malaise   HPI: Jasmine Davidson is a 64 y.o. female with medical history significant for hypertension, type 2 diabetes mellitus, coronary artery disease, chronic kidney disease stage III, chronic foot ulcers, and BMI 39, now presenting to the ED for evaluation of lightheadedness, nausea, vomiting, and general malaise.  The patient reports that she had been in her usual state until 1 week ago when she developed the aforementioned symptoms, has had progressive worsening over the past few days, and now comes in for eval. Patient reports a long history of frequent loose stools, but does not typically have nausea and vomiting which she has been experiencing for a week now.  She has not had any abdominal pain associated with this and also denies any fevers or chills.  She denies dysuria, hematuria, or flank pain.  ED Course: Upon arrival to the ED, patient is found to be afebrile, saturating well on room air, and with stable blood pressure.  EKG features sinus rhythm with LAD.  Chemistry panel is concerning for potassium 5.7, bicarbonate 19, BUN 99, and creatinine 9.26, up from 1.42 in March.  CBC is unremarkable.  High-sensitivity troponin is 25.  Patient was given 2 L of lactated Ringer's, Zofran, and Veltassa in the ED.  Nephrology was consulted by the ED physician and recommended medical admission.  Review of Systems:  All other systems reviewed and apart from HPI, are negative.  Past Medical History:  Diagnosis Date  . CAD (coronary artery disease)   . Diabetes mellitus   . HTN (hypertension)   . Obesity     Past Surgical History:  Procedure Laterality Date  . BLADDER REPAIR W/ CESAREAN SECTION  1981  . BREAST BIOPSY Right 12/17/2014   negative stereotactic  . CARDIAC CATHETERIZATION    . cyst taken off  finger  2011  . TOE SURGERY  02/08/2013     reports that she has never smoked. She has never used smokeless tobacco. She reports that she does not drink alcohol or use drugs.  Allergies  Allergen Reactions  . Pregabalin     Family History  Problem Relation Age of Onset  . Diabetes Father   . Heart failure Father        congestive  . Hypertension Father   . Lupus Father   . COPD Mother      Prior to Admission medications   Medication Sig Start Date End Date Taking? Authorizing Provider  aspirin (ASPIR-81) 81 MG EC tablet Take 81 mg by mouth daily.      [provider]  atorvastatin (LIPITOR) 40 MG tablet TAKE ONE TABLET BY MOUTH EVERY DAY Patient taking differently: Take 40 mg by mouth at bedtime.  02/13/14   Minna Merritts, MD  gabapentin (NEURONTIN) 300 MG capsule Take 300 mg by mouth 2 (two) times daily.    [provider]  insulin NPH-insulin regular (HUMULIN 70/30) (70-30) 100 UNIT/ML injection Inject 10-15 Units into the skin 2 (two) times daily. 15 units every morning and 10 units every evening    [provider]  lisinopril-hydrochlorothiazide (PRINZIDE,ZESTORETIC) 20-12.5 MG per tablet TAKE TWO TABLETS BY MOUTH ONCE DAILY Patient taking differently: Take 2 tablets by mouth daily.  02/13/14   Minna Merritts, MD  metFORMIN (GLUCOPHAGE) 1000 MG tablet Take 1,000 mg by mouth  2 (two) times daily.    [provider]  metoprolol succinate (TOPROL-XL) 50 MG 24 hr tablet Take 1 tablet (50 mg total) by mouth daily. 09/16/13   Minna Merritts, MD    Physical Exam: Vitals:   04/12/20 2145 04/13/20 0011  BP:  (!) 155/79  Pulse:  80  Resp:  16  Temp:  97.8 F (36.6 C)  TempSrc:  Oral  SpO2:  100%  Weight: 109.8 kg   Height: 5\' 6"  (1.676 m)     Constitutional: NAD, calm  Eyes: PERTLA, lids and conjunctivae normal ENMT: Mucous membranes are moist. Posterior pharynx clear of any exudate or lesions.   Neck: normal, supple, no masses, no  thyromegaly Respiratory:  no wheezing, no crackles. No accessory muscle use.  Cardiovascular: S1 & S2 heard, regular rate and rhythm. No extremity edema.   Abdomen: No distension, no tenderness, soft. Bowel sounds active.  Musculoskeletal: no clubbing / cyanosis. No joint deformity upper and lower extremities.   Skin: no significant rashes, lesions, ulcers. Warm, dry, well-perfused. Neurologic: no facial asymmetry. Sensation intact. Moving all extremities.  Psychiatric: Alert and oriented to person, place, and situation. Pleasant and cooperative.    Labs and Imaging on Admission: I have personally reviewed following labs and imaging studies  CBC: Recent Labs  Lab 04/12/20 2149  WBC 8.3  HGB 14.2  HCT 43.0  MCV 93.1  PLT 734   Basic Metabolic Panel: Recent Labs  Lab 04/12/20 2149  NA 134*  K 5.7*  CL 97*  CO2 19*  GLUCOSE 74  BUN 99*  CREATININE 9.26*  CALCIUM 9.2   GFR: Estimated Creatinine Clearance: 7.8 mL/min (A) (by C-G formula based on SCr of 9.26 mg/dL (H)). Liver Function Tests: No results for input(s): AST, ALT, ALKPHOS, BILITOT, PROT, ALBUMIN in the last 168 hours. No results for input(s): LIPASE, AMYLASE in the last 168 hours. No results for input(s): AMMONIA in the last 168 hours. Coagulation Profile: No results for input(s): INR, PROTIME in the last 168 hours. Cardiac Enzymes: No results for input(s): CKTOTAL, CKMB, CKMBINDEX, TROPONINI in the last 168 hours. BNP (last 3 results) No results for input(s): PROBNP in the last 8760 hours. HbA1C: No results for input(s): HGBA1C in the last 72 hours. CBG: No results for input(s): GLUCAP in the last 168 hours. Lipid Profile: No results for input(s): CHOL, HDL, LDLCALC, TRIG, CHOLHDL, LDLDIRECT in the last 72 hours. Thyroid Function Tests: No results for input(s): TSH, T4TOTAL, FREET4, T3FREE, THYROIDAB in the last 72 hours. Anemia Panel: No results for input(s): VITAMINB12, FOLATE, FERRITIN, TIBC, IRON,  RETICCTPCT in the last 72 hours. Urine analysis:    Component Value Date/Time   COLORURINE Colorless 05/31/2014 1920   APPEARANCEUR Clear 05/31/2014 1920   LABSPEC 1.004 05/31/2014 1920   PHURINE 7.0 05/31/2014 1920   GLUCOSEU 50 mg/dL 05/31/2014 1920   HGBUR 1+ 05/31/2014 1920   BILIRUBINUR Negative 05/31/2014 1920   KETONESUR Trace 05/31/2014 1920   PROTEINUR Negative 05/31/2014 1920   NITRITE Negative 05/31/2014 1920   LEUKOCYTESUR Negative 05/31/2014 1920   Sepsis Labs: @LABRCNTIP (procalcitonin:4,lacticidven:4) )No results found for this or any previous visit (from the past 240 hour(s)).   Radiological Exams on Admission: No results found.  EKG: Independently reviewed. Sinus rhythm, LAD.  Assessment/Plan   1. Acute kidney injury superimposed on CKD IIIb; hyperkalemia; metabolic acidosis   - Presents with a week of N/V, malaise, and lightheadedness and is found to have BUN of 99 and SCr of  9.26 with potassium 5.7 and bicarbonate 19; SCr was 1.42 in March 2021   - Possibly prerenal azotemia in setting of N/V and diuretic use, has also been on ACE-i  - Check urine chemistries and renal US, continue IVF hydration, avoid nephrotoxins, renally-dose medications, and repeat chem panel in am    2. Hypertension  - BP at goal  - Hold diuretics and ACE-i   3. CAD  - No anginal complaints  - Troponin mildly elevated with repeat pending  - Continue cardiac monitoring, follow-up pharmacy medication-reconciliation   4. Type II DM  - Check CBGs, use low-intensity SSI with Novolog if needed for now     DVT prophylaxis: sq heparin  Code Status: Full  Family Communication: Discussed with patient  Disposition Plan:  Patient is from: Home  Anticipated d/c is to: Home  Anticipated d/c date is: 04/18/20 Patient currently: Requiring nephrology consultation, repeat potassium level, renal US, improvement in renal function  Consults called: Nephrology consulted by ED physician  Admission  status: Inpatient     Vianne Bulls, MD [facility] Pager: See [email or web address]  If 7AM-7PM, please contact the daytime attending [email or web address]  04/13/2020, 1:03 AM

## 2020-04-13 NOTE — ED Notes (Addendum)
Ready bed now changed to room 125, advised RN Mickel Baas.

## 2020-04-13 NOTE — Progress Notes (Signed)
Called at bedside by nurse for hypotension during HD BP in 47'X systolic Patient is Alert and oriented NS boluses given in 500 cc increments for total of 2000 cc BP to 84/52 Patient is alert and able to follow commands Mouth is still dry Sent stat Potassium May continue NS infusion after HD Monitor BP closely

## 2020-04-13 NOTE — ED Notes (Signed)
Call bell answered pt reports feeling low blood sugar

## 2020-04-13 NOTE — ED Notes (Addendum)
50% dextrose 50 mL injection given; CBG 61  Pt vomiting dark brown watery emesis

## 2020-04-13 NOTE — Progress Notes (Signed)
This note also relates to the following rows which could not be included: Resp - Cannot attach notes to unvalidated device data BP - Cannot attach notes to unvalidated device data  Hd started  

## 2020-04-14 LAB — CBC
HCT: 34.2 % — ABNORMAL LOW (ref 36.0–46.0)
Hemoglobin: 11.9 g/dL — ABNORMAL LOW (ref 12.0–15.0)
MCH: 31.1 pg (ref 26.0–34.0)
MCHC: 34.8 g/dL (ref 30.0–36.0)
MCV: 89.3 fL (ref 80.0–100.0)
Platelets: 157 10*3/uL (ref 150–400)
RBC: 3.83 MIL/uL — ABNORMAL LOW (ref 3.87–5.11)
RDW: 13.5 % (ref 11.5–15.5)
WBC: 7.7 10*3/uL (ref 4.0–10.5)
nRBC: 0 % (ref 0.0–0.2)

## 2020-04-14 LAB — URINALYSIS, ROUTINE W REFLEX MICROSCOPIC
Bilirubin Urine: NEGATIVE
Glucose, UA: >=500 mg/dL — AB
Ketones, ur: 5 mg/dL — AB
Leukocytes,Ua: NEGATIVE
Nitrite: NEGATIVE
Protein, ur: 30 mg/dL — AB
Specific Gravity, Urine: 1.006 (ref 1.005–1.030)
pH: 5 (ref 5.0–8.0)

## 2020-04-14 LAB — PROTEIN ELECTROPHORESIS, SERUM
A/G Ratio: 1 (ref 0.7–1.7)
Albumin ELP: 3.1 g/dL (ref 2.9–4.4)
Alpha-1-Globulin: 0.3 g/dL (ref 0.0–0.4)
Alpha-2-Globulin: 1 g/dL (ref 0.4–1.0)
Beta Globulin: 1.1 g/dL (ref 0.7–1.3)
Gamma Globulin: 0.7 g/dL (ref 0.4–1.8)
Globulin, Total: 3 g/dL (ref 2.2–3.9)
Total Protein ELP: 6.1 g/dL (ref 6.0–8.5)

## 2020-04-14 LAB — BASIC METABOLIC PANEL
Anion gap: 14 (ref 5–15)
BUN: 79 mg/dL — ABNORMAL HIGH (ref 8–23)
CO2: 25 mmol/L (ref 22–32)
Calcium: 8 mg/dL — ABNORMAL LOW (ref 8.9–10.3)
Chloride: 96 mmol/L — ABNORMAL LOW (ref 98–111)
Creatinine, Ser: 6.93 mg/dL — ABNORMAL HIGH (ref 0.44–1.00)
GFR calc Af Amer: 7 mL/min — ABNORMAL LOW (ref >60)
GFR calc non Af Amer: 6 mL/min — ABNORMAL LOW (ref >60)
Glucose, Bld: 156 mg/dL — ABNORMAL HIGH (ref 70–99)
Potassium: 4.5 mmol/L (ref 3.5–5.1)
Sodium: 135 mmol/L (ref 135–145)

## 2020-04-14 LAB — MAGNESIUM: Magnesium: 1.9 mg/dL (ref 1.7–2.4)

## 2020-04-14 LAB — URINE CULTURE: Culture: NO GROWTH

## 2020-04-14 LAB — CREATININE, URINE, RANDOM: Creatinine, Urine: 35 mg/dL

## 2020-04-14 LAB — GLUCOSE, CAPILLARY
Glucose-Capillary: 122 mg/dL — ABNORMAL HIGH (ref 70–99)
Glucose-Capillary: 135 mg/dL — ABNORMAL HIGH (ref 70–99)
Glucose-Capillary: 152 mg/dL — ABNORMAL HIGH (ref 70–99)
Glucose-Capillary: 165 mg/dL — ABNORMAL HIGH (ref 70–99)
Glucose-Capillary: 180 mg/dL — ABNORMAL HIGH (ref 70–99)
Glucose-Capillary: 181 mg/dL — ABNORMAL HIGH (ref 70–99)

## 2020-04-14 LAB — HEPATITIS C ANTIBODY: HCV Ab: NONREACTIVE

## 2020-04-14 LAB — SODIUM, URINE, RANDOM: Sodium, Ur: 77 mmol/L

## 2020-04-14 LAB — HEPATITIS B CORE ANTIBODY, TOTAL: Hep B Core Total Ab: NONREACTIVE

## 2020-04-14 LAB — PHOSPHORUS: Phosphorus: 7.6 mg/dL — ABNORMAL HIGH (ref 2.5–4.6)

## 2020-04-14 LAB — C4 COMPLEMENT: Complement C4, Body Fluid: 39 mg/dL — ABNORMAL HIGH (ref 12–38)

## 2020-04-14 LAB — C3 COMPLEMENT: C3 Complement: 148 mg/dL (ref 82–167)

## 2020-04-14 LAB — OSMOLALITY, URINE: Osmolality, Ur: 304 mOsm/kg (ref 300–900)

## 2020-04-14 MED ORDER — SODIUM CHLORIDE 0.9 % IV SOLN: INTRAVENOUS | Status: DC

## 2020-04-14 MED ORDER — FAMOTIDINE 20 MG PO TABS
20.0000 mg | ORAL_TABLET | Freq: Every day | ORAL | Status: DC
  Administered 2020-04-14 – 2020-04-17 (×4): 20 mg via ORAL
  Filled 2020-04-14 (×4): qty 1

## 2020-04-14 NOTE — Progress Notes (Signed)
This RN contacted MD in regards to urine output, as patient was currently in the process of emergency dialysis, and patient appeared to not be distressed, no orders received and told to follow up after patient finished dialysis, as that "should" resolve her K+ and lack of urine output.  Will continue to monitor and assess.

## 2020-04-14 NOTE — Progress Notes (Signed)
Patient bladder scanned and retaining urine of >500 at 1200pm today. Patient states "I have no urge to pee".  MD ordered insertion of foley catheter.  This RN inserted 16 French foley catheter per protocol with no issues noted.  Initial output of 869mls of clear, yellow urine. Will continue to monitor.

## 2020-04-14 NOTE — Progress Notes (Signed)
[place] visited pt. while rounding on ICU; pt. in bed sitting up, seemed drowsy.  Husband hopes pt. can come home soon; said she has been wanting to get up and walk.  Husband in rm in [place] chair.  Husband excused from rm. shortly after [facility] arrived for foley placement for pt.  CH and husband talked for a little while @ RN station; husband shared extensively about his grandchildren and children; family appears to support one another well.  No needs at this time, but pt. and husband grateful for [facility] support; Ch remains available as needed.    04/14/20 1130  Clinical Encounter Type  Visited With Family;Patient and family together  Visit Type Initial;Psychological support;Social support;Critical Care  Referral From Other (Comment) (Routine Rounding)  Spiritual Encounters  Spiritual Needs Emotional  Stress Factors  Patient Stress Factors Health changes;Major life changes  Family Stress Factors Loss of control;Health changes

## 2020-04-14 NOTE — Consult Note (Signed)
[address] [place], [place] [address] Phone [phone]. Fax [phone]  Date: 04/14/2020                  Patient Name:  Jasmine Davidson  MRN: [id number]  DOB: [birth year]  Age / Sex: 64 y.o., female         PCP: Alene Mires Elyse Jarvis, MD                 Service Requesting Consult:  Internal medicine/ [facility]                 Reason for Consult:  Hyperkalemia and acute renal failure            History of Present Illness: Patient is a 64 y.o. female with medical problems of, coronary disease, diabetes, hyperlipidemia, who was admitted to [facility] on 04/13/2020 for evaluation of Dehydration [E86.0] Hyperkalemia [E87.5] AKI (acute kidney injury) ([place]) [N17.9] Acute renal failure superimposed on stage 3 chronic kidney disease, unspecified acute renal failure type, unspecified whether stage 3a or 3b CKD ([place]) [N17.9, N18.30]  Patient underwent acute dialysis yesterday for hyperkalemia Potassium level is now in the normal range Overnight she had 1800 cc of urine output.  Unfortunately urinary retention was noted and she had to undergo in and out catheterization Today she is more awake, more interactive Mouth is still dry Able to take a few sips    Current medications: Current Facility-Administered Medications  Medication Dose Route Frequency Provider Last Rate Last Admin  . acetaminophen (TYLENOL) tablet 650 mg  650 mg Oral Q6H PRN Opyd, Ilene Qua, MD       Or  . acetaminophen (TYLENOL) suppository 650 mg  650 mg Rectal Q6H PRN Opyd, Ilene Qua, MD      . Chlorhexidine Gluconate Cloth 2 % PADS 6 each  6 each Topical Q0600 Murlean Iba, MD   6 each at 04/14/20 0529  . heparin injection 5,000 Units  5,000 Units Subcutaneous Q8H Vianne Bulls, MD   5,000 Units at 04/14/20 0529  . insulin aspart (novoLOG) injection 0-6 Units  0-6 Units Subcutaneous TID WC Opyd, Ilene Qua, MD   1 Units at 04/14/20 0818  . midodrine (PROAMATINE) tablet 5 mg  5 mg Oral  TID WC Lang Snow, NP   5 mg at 04/14/20 0751  . ondansetron (ZOFRAN) tablet 4 mg  4 mg Oral Q6H PRN Opyd, Ilene Qua, MD       Or  . ondansetron (ZOFRAN) injection 4 mg  4 mg Intravenous Q6H PRN Opyd, Ilene Qua, MD   4 mg at 04/13/20 0436  . sodium chloride flush (NS) 0.9 % injection 3 mL  3 mL Intravenous Q12H Opyd, Ilene Qua, MD   3 mL at 04/14/20 0930     Vital Signs: Blood pressure (!) 105/57, pulse 66, temperature 98.1 F (36.7 C), temperature source Oral, resp. rate 16, height 5\' 6"  (1.676 m), weight 115.9 kg, last menstrual period 09/12/1999, SpO2 96 %.   Intake/Output Summary (Last 24 hours) at 04/14/2020 1040 Last data filed at 04/14/2020 0537 Gross per 24 hour  Intake --  Output 3525 ml  Net -3525 ml    Weight trends: Filed Weights   04/12/20 2145 04/13/20 1004  Weight: 109.8 kg 115.9 kg    Physical Exam: General:  Ill-appearing, laying in the bed  HEENT  dry oral mucous membranes  Neck:  Supple, no masses  Lungs:  Normal breathing effort on room air,  clear to auscultation  Heart::  Regular rhythm, sinus on telemetry  Abdomen:  Soft,  Extremities:  No peripheral edema  Neurologic:  Alert, oriented  Skin:  No acute rashes  Access:  Right femoral temp cath  Foley:        Lab results: Basic Metabolic Panel: Recent Labs  Lab 04/13/20 0616 04/13/20 0616 04/13/20 1126 04/13/20 1642 04/14/20 0402  NA 134*  --  137  --  135  K 7.2*   < > 6.9* 4.5 4.5  CL 95*  --  94*  --  96*  CO2 12*  --  12*  --  25  GLUCOSE 155*  --  163*  --  156*  BUN 102*  --  105*  --  79*  CREATININE 9.21*  --  9.43*  --  6.93*  CALCIUM 8.8*  --  8.9  --  8.0*  MG  --   --   --   --  1.9  PHOS  --   --  9.3*  --  7.6*   < > = values in this interval not displayed.    Liver Function Tests: No results for input(s): AST, ALT, ALKPHOS, BILITOT, PROT, ALBUMIN in the last 168 hours. No results for input(s): LIPASE, AMYLASE in the last 168 hours. No results for input(s):  AMMONIA in the last 168 hours.  CBC: Recent Labs  Lab 04/13/20 0616 04/14/20 0402  WBC 9.8 7.7  HGB 13.9 11.9*  HCT 43.4 34.2*  MCV 95.6 89.3  PLT 184 157    Cardiac Enzymes: No results for input(s): CKTOTAL, TROPONINI in the last 168 hours.  BNP: Invalid input(s): POCBNP  CBG: Recent Labs  Lab 04/13/20 1808 04/13/20 2237 04/14/20 0045 04/14/20 0509 04/14/20 0713  GLUCAP 99 106* 122* 135* 152*    Microbiology: Recent Results (from the past 720 hour(s))  Respiratory Panel by RT PCR (Flu A&B, Covid) - Nasopharyngeal Swab     Status: None   Collection Time: 04/13/20 12:40 AM   Specimen: Nasopharyngeal Swab  Result Value Ref Range Status   SARS Coronavirus 2 by RT PCR NEGATIVE NEGATIVE Final    Comment: (NOTE) SARS-CoV-2 target nucleic acids are NOT DETECTED. The SARS-CoV-2 RNA is generally detectable in upper respiratoy specimens during the acute phase of infection. The lowest concentration of SARS-CoV-2 viral copies this assay can detect is 131 copies/mL. A negative result does not preclude SARS-Cov-2 infection and should not be used as the sole basis for treatment or other patient management decisions. A negative result may occur with  improper specimen collection/handling, submission of specimen other than nasopharyngeal swab, presence of viral mutation(s) within the areas targeted by this assay, and inadequate number of viral copies (<131 copies/mL). A negative result must be combined with clinical observations, patient history, and epidemiological information. The expected result is Negative. Fact Sheet for Patients:  PinkCheek.be Fact Sheet for Healthcare Providers:  GravelBags.it This test is not yet ap proved or cleared by the [place] FDA and  has been authorized for detection and/or diagnosis of SARS-CoV-2 by FDA under an Emergency Use Authorization (EUA). This EUA will remain  in effect  (meaning this test can be used) for the duration of the COVID-19 declaration under Section 564(b)(1) of the Act, 21 U.S.C. section 360bbb-3(b)(1), unless the authorization is terminated or revoked sooner.    Influenza A by PCR NEGATIVE NEGATIVE Final   Influenza B by PCR NEGATIVE NEGATIVE Final    Comment: (NOTE) The Xpert  Xpress SARS-CoV-2/FLU/RSV assay is intended as an aid in  the diagnosis of influenza from Nasopharyngeal swab specimens and  should not be used as a sole basis for treatment. Nasal washings and  aspirates are unacceptable for Xpert Xpress SARS-CoV-2/FLU/RSV  testing. Fact Sheet for Patients: PinkCheek.be Fact Sheet for Healthcare Providers: GravelBags.it This test is not yet approved or cleared by the [place] FDA and  has been authorized for detection and/or diagnosis of SARS-CoV-2 by  FDA under an Emergency Use Authorization (EUA). This EUA will remain  in effect (meaning this test can be used) for the duration of the  Covid-19 declaration under Section 564(b)(1) of the Act, 21  U.S.C. section 360bbb-3(b)(1), unless the authorization is  terminated or revoked. Performed at [facility], [address] [place], [place] [address]   MRSA PCR Screening     Status: None   Collection Time: 04/13/20 10:08 AM   Specimen: Nasopharyngeal  Result Value Ref Range Status   MRSA by PCR NEGATIVE NEGATIVE Final    Comment:        The GeneXpert MRSA Assay (FDA approved for NASAL specimens only), is one component of a comprehensive MRSA colonization surveillance program. It is not intended to diagnose MRSA infection nor to guide or monitor treatment for MRSA infections. Performed at [facility], [address] [place], [place] [address]      Coagulation Studies: No results for input(s): LABPROT, INR in the last 72 hours.  Urinalysis: Recent Labs    04/13/20 2355  COLORURINE  YELLOW*  LABSPEC 1.006  PHURINE 5.0  GLUCOSEU >=500*  HGBUR SMALL*  BILIRUBINUR NEGATIVE  KETONESUR 5*  PROTEINUR 30*  NITRITE NEGATIVE  LEUKOCYTESUR NEGATIVE        Imaging: US Renal  Result Date: 04/13/2020 CLINICAL DATA:  64 year old female with renal failure. EXAM: RENAL / URINARY TRACT ULTRASOUND COMPLETE COMPARISON:  Renal ultrasound dated 08/08/2017. FINDINGS: Right Kidney: Renal measurements: 10.6 x 5.6 x 5.8 cm = volume: 180 mL. Mild parenchyma atrophy. Normal echogenicity. No hydronephrosis or shadowing stone. Left Kidney: Renal measurements: 11.6 x 5.9 x 5.6 cm = volume: 198 mL. Mild parenchyma atrophy and cortical thinning. Normal echogenicity. No hydronephrosis or shadowing stone. Bladder: The urinary bladder is not well visualized. Other: None. IMPRESSION: No hydronephrosis or shadowing stone. Electronically Signed   By: Anner Crete M.D.   On: 04/13/2020 02:43     Assessment & Plan: Pt is a 64 y.o. Caucasian  female with  Hypertension Diabetes with retinopathy and nephropathy Coronary disease Chronic kidney disease stage IIIb.  Baseline creatinine 1.42/GFR February 12, 2020 Chronic foot ulcers Obesity,  was admitted on 04/13/2020 with Dehydration [E86.0] Hyperkalemia [E87.5] AKI (acute kidney injury) ([place]) [N17.9] Acute renal failure superimposed on stage 3 chronic kidney disease, unspecified acute renal failure type, unspecified whether stage 3a or 3b CKD ([facility]) [N17.9, N18.30]  #Acute kidney injury with severe hyperkalemia #Chronic kidney disease stage IIIb.  Baseline creatinine 1.42/GFR 40 from March 3 renal ultrasound is negative for obstruction Outpatient medications -Jardiance, furosemide, hydrochlorothiazide, lisinopril, Metformin should be held in current setting of acute kidney injury Lab Results  Component Value Date   CREATININE 6.93 (H) 04/14/2020   CREATININE 9.43 (H) 04/13/2020   CREATININE 9.21 (H) 04/13/2020   Agree with IV volume resuscitation  with normal saline Avoid potassium containing IV fluids Potassium level has now corrected. Urine output has improved. Electrolytes and volume status are acceptable.  No acute indication for dialysis Will assess response to  IV hydration.  #Diabetes type 2 with complications Lab Results  Component Value Date   HGBA1C 6.6 (H) 04/13/2020            LOS: 1 Gwyn Hieronymus 5/4/202110:40 AM    Note: This note was prepared with Dragon dictation. Any transcription errors are unintentional

## 2020-04-14 NOTE — Progress Notes (Signed)
PROGRESS NOTE    Joniyah Mallinger Luby  XTK:[id number]  DOB: March 07, 1956  DOA: 04/13/2020 PCP: Theotis Burrow, MD Outpatient Specialists:   Hospital course:  ROMILDA PROBY is a 64 y.o. female with medical history significant for hypertension, type 2 diabetes mellitus, coronary artery disease, chronic kidney disease stage III, chronic foot ulcers, and BMI 39, Admitted 04/13/2020 with dizziness, malaise, nausea and almost intractable vomiting.  She was found to be in acute kidney failure with a creatinine of 9.3, increased from 1.4 in March.  Patient was noted to have potassium of 7.2 the following morning and started emergency dialysis.  Subjective:  Patient states she feels a little bit better.  She is relieved not to be vomiting anymore.  She may be developing a little bit of an appetite but is not sure.  She continues to be very weak and tired.  Of note per RN, patient had 1800 cc in her bladder which was treated with in and out cath last night.  Repeat scan showed 400 cc also treated with in and out cath.   Objective: Vitals:   04/14/20 0513 04/14/20 0530 04/14/20 0630 04/14/20 0800  BP: 98/60 (!) 90/55 (!) 85/51 (!) 105/57  Pulse: 70 69 68 66  Resp: 14 12 11 16   Temp: 98.2 F (36.8 C)   98.1 F (36.7 C)  TempSrc: Oral   Oral  SpO2: 93% 95% 94% 96%  Weight:      Height:        Intake/Output Summary (Last 24 hours) at 04/14/2020 0944 Last data filed at 04/14/2020 0537 Gross per 24 hour  Intake -  Output 3525 ml  Net -3525 ml   Filed Weights   04/12/20 2145 04/13/20 1004  Weight: 109.8 kg 115.9 kg     Exam:  General: Weak tired appearing female sitting up in bed trying to eat breakfast.  Attentive husband at bedside. Eyes: sclera anicteric, conjuctiva mild injection bilaterally CVS: S1-S2, regular  Respiratory:  decreased air entry bilaterally secondary to decreased inspiratory effort, rales at bases  GI: NABS, soft, NT  LE: No edema.  Neuro: A/O x 3, Moving  all extremities equally with normal strength, CN 3-12 intact, grossly nonfocal.  Psych: patient is logical and coherent, judgement and insight appear normal, mood and affect appropriate to situation.   Assessment & Plan:   Acute kidney failure Patient has known baseline diabetic nephropathy with CKD 3B followed by Dr. Radene K[facility]. Unclear when patient developed acute kidney failure, but symptoms have been present for 1 week.  Patient was initially thought to be an uric however bladder scan after 5 L fluid resuscitation revealed 1800 cc urine in the bladder which was treated with in and out catheterization.  Renal ultrasound done yesterday showed no hydronephrosis or stone.  Patient is to undergo HD #2 today per renal. UA has been sent with microscopy, complement level C3-C4 are within normal limits. Patient denies any new medications in the past couple of months, although she was diagnosed with Covid in December subsequent creatinines have been at baseline. Ongoing work-up per nephrology consultation.  HTN Patient is relatively hypotensive and has required IV fluid resuscitation to maintain blood pressure. All her antihypertensives including Lasix, lisinopril, Toprol-XL, verapamil and HCTZ are being held.  DM 2 Patient has had DM2 x30 years and has been complicated by diabetic nephropathy, retinopathy and neuropathy.  She is usually on Metformin, liraglutide, Jardiance and insulin 70/30, all of which are being held.  She is presently on sliding scale coverage with aspart and blood sugars are presently well controlled.  Patient is not eating very much.   DVT prophylaxis: Subcu heparin Code Status: Full Family Communication: Patient's husband is at bedside throughout Disposition Plan:   Patient is from: Home  Anticipated Discharge Location: Home  Barriers to Discharge: Ongoing uremia  Is patient medically stable for Discharge: No   Consultants:  Nephrology  Procedures:   Dialysis catheter placement and initiation of dialysis 04/13/2020  Antimicrobials:  None   Data Reviewed:  Basic Metabolic Panel: Recent Labs  Lab 04/12/20 2149 04/13/20 0616 04/13/20 1126 04/13/20 1642 04/14/20 0402  NA 134* 134* 137  --  135  K 5.7* 7.2* 6.9* 4.5 4.5  CL 97* 95* 94*  --  96*  CO2 19* 12* 12*  --  25  GLUCOSE 74 155* 163*  --  156*  BUN 99* 102* 105*  --  79*  CREATININE 9.26* 9.21* 9.43*  --  6.93*  CALCIUM 9.2 8.8* 8.9  --  8.0*  MG  --   --   --   --  1.9  PHOS  --   --  9.3*  --  7.6*   Liver Function Tests: No results for input(s): AST, ALT, ALKPHOS, BILITOT, PROT, ALBUMIN in the last 168 hours. No results for input(s): LIPASE, AMYLASE in the last 168 hours. No results for input(s): AMMONIA in the last 168 hours. CBC: Recent Labs  Lab 04/12/20 2149 04/13/20 0616 04/14/20 0402  WBC 8.3 9.8 7.7  HGB 14.2 13.9 11.9*  HCT 43.0 43.4 34.2*  MCV 93.1 95.6 89.3  PLT 185 184 157   Cardiac Enzymes: No results for input(s): CKTOTAL, CKMB, CKMBINDEX, TROPONINI in the last 168 hours. BNP (last 3 results) No results for input(s): PROBNP in the last 8760 hours. CBG: Recent Labs  Lab 04/13/20 1808 04/13/20 2237 04/14/20 0045 04/14/20 0509 04/14/20 0713  GLUCAP 99 106* 122* 135* 152*    Recent Results (from the past 240 hour(s))  Respiratory Panel by RT PCR (Flu A&B, Covid) - Nasopharyngeal Swab     Status: None   Collection Time: 04/13/20 12:40 AM   Specimen: Nasopharyngeal Swab  Result Value Ref Range Status   SARS Coronavirus 2 by RT PCR NEGATIVE NEGATIVE Final    Comment: (NOTE) SARS-CoV-2 target nucleic acids are NOT DETECTED. The SARS-CoV-2 RNA is generally detectable in upper respiratoy specimens during the acute phase of infection. The lowest concentration of SARS-CoV-2 viral copies this assay can detect is 131 copies/mL. A negative result does not preclude SARS-Cov-2 infection and should not be used as the sole basis for treatment or  other patient management decisions. A negative result may occur with  improper specimen collection/handling, submission of specimen other than nasopharyngeal swab, presence of viral mutation(s) within the areas targeted by this assay, and inadequate number of viral copies (<131 copies/mL). A negative result must be combined with clinical observations, patient history, and epidemiological information. The expected result is Negative. Fact Sheet for Patients:  PinkCheek.be Fact Sheet for Healthcare Providers:  GravelBags.it This test is not yet ap proved or cleared by the [place] FDA and  has been authorized for detection and/or diagnosis of SARS-CoV-2 by FDA under an Emergency Use Authorization (EUA). This EUA will remain  in effect (meaning this test can be used) for the duration of the COVID-19 declaration under Section 564(b)(1) of the Act, 21 U.S.C. section 360bbb-3(b)(1), unless the authorization is terminated or revoked  sooner.    Influenza A by PCR NEGATIVE NEGATIVE Final   Influenza B by PCR NEGATIVE NEGATIVE Final    Comment: (NOTE) The Xpert Xpress SARS-CoV-2/FLU/RSV assay is intended as an aid in  the diagnosis of influenza from Nasopharyngeal swab specimens and  should not be used as a sole basis for treatment. Nasal washings and  aspirates are unacceptable for Xpert Xpress SARS-CoV-2/FLU/RSV  testing. Fact Sheet for Patients: PinkCheek.be Fact Sheet for Healthcare Providers: GravelBags.it This test is not yet approved or cleared by the [place] FDA and  has been authorized for detection and/or diagnosis of SARS-CoV-2 by  FDA under an Emergency Use Authorization (EUA). This EUA will remain  in effect (meaning this test can be used) for the duration of the  Covid-19 declaration under Section 564(b)(1) of the Act, 21  U.S.C. section 360bbb-3(b)(1),  unless the authorization is  terminated or revoked. Performed at [facility], [address] [place], [place] [address]   MRSA PCR Screening     Status: None   Collection Time: 04/13/20 10:08 AM   Specimen: Nasopharyngeal  Result Value Ref Range Status   MRSA by PCR NEGATIVE NEGATIVE Final    Comment:        The GeneXpert MRSA Assay (FDA approved for NASAL specimens only), is one component of a comprehensive MRSA colonization surveillance program. It is not intended to diagnose MRSA infection nor to guide or monitor treatment for MRSA infections. Performed at [facility], [address], [place], [place] [address]       Studies: US Renal  Result Date: 04/13/2020 CLINICAL DATA:  64 year old female with renal failure. EXAM: RENAL / URINARY TRACT ULTRASOUND COMPLETE COMPARISON:  Renal ultrasound dated 08/08/2017. FINDINGS: Right Kidney: Renal measurements: 10.6 x 5.6 x 5.8 cm = volume: 180 mL. Mild parenchyma atrophy. Normal echogenicity. No hydronephrosis or shadowing stone. Left Kidney: Renal measurements: 11.6 x 5.9 x 5.6 cm = volume: 198 mL. Mild parenchyma atrophy and cortical thinning. Normal echogenicity. No hydronephrosis or shadowing stone. Bladder: The urinary bladder is not well visualized. Other: None. IMPRESSION: No hydronephrosis or shadowing stone. Electronically Signed   By: Anner Crete M.D.   On: 04/13/2020 02:43     Scheduled Meds: . Chlorhexidine Gluconate Cloth  6 each Topical Q0600  . heparin  5,000 Units Subcutaneous Q8H  . insulin aspart  0-6 Units Subcutaneous TID WC  . midodrine  5 mg Oral TID WC  . sodium chloride flush  3 mL Intravenous Q12H   Continuous Infusions:  Principal Problem:   Acute renal failure superimposed on stage 3 chronic kidney disease ([facility]) Active Problems:   Insulin-requiring or dependent type II diabetes mellitus ([place])   HTN (hypertension)   CAD, NATIVE VESSEL   Hyperkalemia     Srobona Tublu  [place], [facility]  If 7PM-7AM, please contact night-coverage [email or web address] Password [email or web address] 04/14/2020, 9:44 AM    LOS: 1 day

## 2020-04-14 NOTE — Progress Notes (Signed)
Assisted tele visit to patient with family members.  Kittitas Wenzlick Anderson, RN   

## 2020-04-15 LAB — CBC
HCT: 33.4 % — ABNORMAL LOW (ref 36.0–46.0)
Hemoglobin: 11 g/dL — ABNORMAL LOW (ref 12.0–15.0)
MCH: 30.6 pg (ref 26.0–34.0)
MCHC: 32.9 g/dL (ref 30.0–36.0)
MCV: 92.8 fL (ref 80.0–100.0)
Platelets: 129 10*3/uL — ABNORMAL LOW (ref 150–400)
RBC: 3.6 MIL/uL — ABNORMAL LOW (ref 3.87–5.11)
RDW: 13.5 % (ref 11.5–15.5)
WBC: 5.1 10*3/uL (ref 4.0–10.5)
nRBC: 0 % (ref 0.0–0.2)

## 2020-04-15 LAB — GLUCOSE, CAPILLARY
Glucose-Capillary: 137 mg/dL — ABNORMAL HIGH (ref 70–99)
Glucose-Capillary: 226 mg/dL — ABNORMAL HIGH (ref 70–99)
Glucose-Capillary: 245 mg/dL — ABNORMAL HIGH (ref 70–99)
Glucose-Capillary: 227 mg/dL — ABNORMAL HIGH (ref 70–99)

## 2020-04-15 LAB — BASIC METABOLIC PANEL
Anion gap: 11 (ref 5–15)
BUN: 82 mg/dL — ABNORMAL HIGH (ref 8–23)
CO2: 28 mmol/L (ref 22–32)
Calcium: 8.2 mg/dL — ABNORMAL LOW (ref 8.9–10.3)
Chloride: 99 mmol/L (ref 98–111)
Creatinine, Ser: 5.75 mg/dL — ABNORMAL HIGH (ref 0.44–1.00)
GFR calc Af Amer: 8 mL/min — ABNORMAL LOW (ref >60)
GFR calc non Af Amer: 7 mL/min — ABNORMAL LOW (ref >60)
Glucose, Bld: 164 mg/dL — ABNORMAL HIGH (ref 70–99)
Potassium: 3.9 mmol/L (ref 3.5–5.1)
Sodium: 138 mmol/L (ref 135–145)

## 2020-04-15 LAB — MAGNESIUM: Magnesium: 1.9 mg/dL (ref 1.7–2.4)

## 2020-04-15 LAB — UREA NITROGEN, URINE: Urea Nitrogen, Ur: 217 mg/dL

## 2020-04-15 LAB — ANA W/REFLEX IF POSITIVE: Anti Nuclear Antibody (ANA): NEGATIVE

## 2020-04-15 LAB — PHOSPHORUS: Phosphorus: 4.6 mg/dL (ref 2.5–4.6)

## 2020-04-15 LAB — GLOMERULAR BASEMENT MEMBRANE ANTIBODIES: GBM Ab: 3 units (ref 0–20)

## 2020-04-15 MED ORDER — ASPIRIN EC 81 MG PO TBEC
81.0000 mg | DELAYED_RELEASE_TABLET | Freq: Every day | ORAL | Status: DC
  Administered 2020-04-15 – 2020-04-18 (×4): 81 mg via ORAL
  Filled 2020-04-15 (×4): qty 1

## 2020-04-15 NOTE — Progress Notes (Signed)
PROGRESS NOTE    Jasmine Davidson  MBE:[id number] DOB: 12-Jul-1956 DOA: 04/13/2020 PCP: Theotis Burrow, MD    Brief Narrative: Jasmine Davidson a 64 y.o.femalewith medical history significant forhypertension, type 2 diabetes mellitus, coronary artery disease, chronic kidney disease stage III,chronic foot ulcers,and BMI 39, Admitted 04/13/2020 with dizziness, malaise, nausea and almost intractable vomiting.  She was found to be in acute kidney failure with a creatinine of 9.3, increased from 1.4 in March.  Patient was noted to have potassium of 7.2 the following morning and started emergency dialysis.  5/5: Patient seen and examined.  Husband at bedside.  Stable.  Energy level still low but improving.  Feeling a little better.  Intractable vomiting has decreased.  Patient had some issues with urinary retention.  Foley catheter in place.   Assessment & Plan:   Principal Problem:   Acute renal failure superimposed on stage 3 chronic kidney disease (HCC) Active Problems:   Insulin-requiring or dependent type II diabetes mellitus (HCC)   HTN (hypertension)   CAD, NATIVE VESSEL   Hyperkalemia  Acute kidney failure Patient has known baseline diabetic nephropathy with CKD 3B followed by Dr. Radene Knee at [facility]. Unclear when patient developed acute kidney failure, but symptoms have been present for 1 week.  Patient was initially thought to be an uric however bladder scan after 5 L fluid resuscitation revealed 1800 cc urine in the bladder which was treated with in and out catheterization.  Renal ultrasound showed no hydronephrosis or stone.  UA has been sent with microscopy, complement level C3-C4 are within normal limits. Patient denies any new medications in the past couple of months, although she was diagnosed with Covid in December subsequent creatinines have been at baseline. Plan: Continue with IV fluid hydration No indication for dialysis today Anticipate dialysis catheter removal  tomorrow Maintain Foley catheter for today can attempt voiding trial tomorrow Appreciate nephrology follow-up  HTN Continue to hold all home antihypertensives including Lasix, lisinopril, Toprol-XL, verapamil and HCTZ Can restart slowly as appropriate  DM 2 Patient has had DM2 x30 years and has been complicated by diabetic nephropathy, retinopathy and neuropathy.  She is usually on Metformin, liraglutide, Jardiance and insulin 70/30, all of which are being held.  She is presently on sliding scale coverage with aspart and blood sugars are presently well controlled.  Patient is not eating very much.   DVT prophylaxis: Subcu heparin Code Status: Full code Family Communication: Husband at bedside Disposition Plan: Status is: Inpatient  Remains inpatient appropriate because:Inpatient level of care appropriate due to severity of illness   Dispo: The patient is from: Home              Anticipated d/c is to: Home              Anticipated d/c date is: 2 days              Patient currently is not medically stable to d/c.  Patient still with acute renal failure.  Creatinine on baseline.  Uremia and seems to be improving.  Energy level seems to be picking up.  Dialysis catheter remains in place.  Tentative plan to remove within 24 hours.  Also tentative plan to remove Foley catheter within 24 hours.       Consultants:   Nephrology- [facility]  Procedures:   Hemodialysis catheter placement  Foley catheterization  Antimicrobials:   None   Subjective: Seen and examined Husband at bedside Patient endorses fatigue otherwise no complaints  Objective: Vitals:   04/15/20 1100 04/15/20 1200 04/15/20 1300 04/15/20 1357  BP: (!) 119/54 122/76 (!) 141/65 (!) 131/46  Pulse: 64 62 60 67  Resp: 11 11 (!) 9   Temp:    97.9 F (36.6 C)  TempSrc:    Oral  SpO2: 97% 96% 100% 100%  Weight:    119.1 kg  Height:    5\' 6"  (1.676 m)    Intake/Output Summary (Last 24 hours) at 04/15/2020  1516 Last data filed at 04/15/2020 1300 Gross per 24 hour  Intake 3775.3 ml  Output 4075 ml  Net -299.7 ml   Filed Weights   04/12/20 2145 04/13/20 1004 04/15/20 1357  Weight: 109.8 kg 115.9 kg 119.1 kg    Examination:  General: Weak tired appearing female sitting up in bed trying to eat breakfast.  Attentive husband at bedside. Eyes: sclera anicteric, conjuctiva mild injection bilaterally CVS: S1-S2, regular  Respiratory:  decreased air entry bilaterally secondary to decreased inspiratory effort, rales at bases  GI: NABS, soft, NT  LE: No edema.  Neuro: A/O x 3, Moving all extremities equally with normal strength, CN 3-12 intact, grossly nonfocal.  Psych: patient is logical and coherent, judgement and insight appear normal, mood and affect appropriate to situation.    Data Reviewed: I have personally reviewed following labs and imaging studies  CBC: Recent Labs  Lab 04/12/20 2149 04/13/20 0616 04/14/20 0402 04/15/20 0417  WBC 8.3 9.8 7.7 5.1  HGB 14.2 13.9 11.9* 11.0*  HCT 43.0 43.4 34.2* 33.4*  MCV 93.1 95.6 89.3 92.8  PLT 185 184 157 937*   Basic Metabolic Panel: Recent Labs  Lab 04/12/20 2149 04/12/20 2149 04/13/20 0616 04/13/20 1126 04/13/20 1642 04/14/20 0402 04/15/20 0417  NA 134*  --  134* 137  --  135 138  K 5.7*   < > 7.2* 6.9* 4.5 4.5 3.9  CL 97*  --  95* 94*  --  96* 99  CO2 19*  --  12* 12*  --  25 28  GLUCOSE 74  --  155* 163*  --  156* 164*  BUN 99*  --  102* 105*  --  79* 82*  CREATININE 9.26*  --  9.21* 9.43*  --  6.93* 5.75*  CALCIUM 9.2  --  8.8* 8.9  --  8.0* 8.2*  MG  --   --   --   --   --  1.9 1.9  PHOS  --   --   --  9.3*  --  7.6* 4.6   < > = values in this interval not displayed.   GFR: Estimated Creatinine Clearance: 13.2 mL/min (A) (by C-G formula based on SCr of 5.75 mg/dL (H)). Liver Function Tests: No results for input(s): AST, ALT, ALKPHOS, BILITOT, PROT, ALBUMIN in the last 168 hours. No results for input(s): LIPASE,  AMYLASE in the last 168 hours. No results for input(s): AMMONIA in the last 168 hours. Coagulation Profile: No results for input(s): INR, PROTIME in the last 168 hours. Cardiac Enzymes: No results for input(s): CKTOTAL, CKMB, CKMBINDEX, TROPONINI in the last 168 hours. BNP (last 3 results) No results for input(s): PROBNP in the last 8760 hours. HbA1C: Recent Labs    04/13/20 0616  HGBA1C 6.6*   CBG: Recent Labs  Lab 04/14/20 1128 04/14/20 1540 04/14/20 2136 04/15/20 0713 04/15/20 1109  GLUCAP 165* 180* 181* 137* 226*   Lipid Profile: No results for input(s): CHOL, HDL, LDLCALC, TRIG, CHOLHDL, LDLDIRECT in the  last 72 hours. Thyroid Function Tests: No results for input(s): TSH, T4TOTAL, FREET4, T3FREE, THYROIDAB in the last 72 hours. Anemia Panel: No results for input(s): VITAMINB12, FOLATE, FERRITIN, TIBC, IRON, RETICCTPCT in the last 72 hours. Sepsis Labs: No results for input(s): PROCALCITON, LATICACIDVEN in the last 168 hours.  Recent Results (from the past 240 hour(s))  Respiratory Panel by RT PCR (Flu A&B, Covid) - Nasopharyngeal Swab     Status: None   Collection Time: 04/13/20 12:40 AM   Specimen: Nasopharyngeal Swab  Result Value Ref Range Status   SARS Coronavirus 2 by RT PCR NEGATIVE NEGATIVE Final    Comment: (NOTE) SARS-CoV-2 target nucleic acids are NOT DETECTED. The SARS-CoV-2 RNA is generally detectable in upper respiratoy specimens during the acute phase of infection. The lowest concentration of SARS-CoV-2 viral copies this assay can detect is 131 copies/mL. A negative result does not preclude SARS-Cov-2 infection and should not be used as the sole basis for treatment or other patient management decisions. A negative result may occur with  improper specimen collection/handling, submission of specimen other than nasopharyngeal swab, presence of viral mutation(s) within the areas targeted by this assay, and inadequate number of viral copies (<131  copies/mL). A negative result must be combined with clinical observations, patient history, and epidemiological information. The expected result is Negative. Fact Sheet for Patients:  PinkCheek.be Fact Sheet for Healthcare Providers:  GravelBags.it This test is not yet ap proved or cleared by the [place] FDA and  has been authorized for detection and/or diagnosis of SARS-CoV-2 by FDA under an Emergency Use Authorization (EUA). This EUA will remain  in effect (meaning this test can be used) for the duration of the COVID-19 declaration under Section 564(b)(1) of the Act, 21 U.S.C. section 360bbb-3(b)(1), unless the authorization is terminated or revoked sooner.    Influenza A by PCR NEGATIVE NEGATIVE Final   Influenza B by PCR NEGATIVE NEGATIVE Final    Comment: (NOTE) The Xpert Xpress SARS-CoV-2/FLU/RSV assay is intended as an aid in  the diagnosis of influenza from Nasopharyngeal swab specimens and  should not be used as a sole basis for treatment. Nasal washings and  aspirates are unacceptable for Xpert Xpress SARS-CoV-2/FLU/RSV  testing. Fact Sheet for Patients: PinkCheek.be Fact Sheet for Healthcare Providers: GravelBags.it This test is not yet approved or cleared by the [place] FDA and  has been authorized for detection and/or diagnosis of SARS-CoV-2 by  FDA under an Emergency Use Authorization (EUA). This EUA will remain  in effect (meaning this test can be used) for the duration of the  Covid-19 declaration under Section 564(b)(1) of the Act, 21  U.S.C. section 360bbb-3(b)(1), unless the authorization is  terminated or revoked. Performed at [facility], [address] [place], [place] [address]   MRSA PCR Screening     Status: None   Collection Time: 04/13/20 10:08 AM   Specimen: Nasopharyngeal  Result Value Ref Range Status   MRSA by  PCR NEGATIVE NEGATIVE Final    Comment:        The GeneXpert MRSA Assay (FDA approved for NASAL specimens only), is one component of a comprehensive MRSA colonization surveillance program. It is not intended to diagnose MRSA infection nor to guide or monitor treatment for MRSA infections. Performed at [facility], [address] [place], [place] [address]   Culture, Urine     Status: None   Collection Time: 04/13/20 11:55 PM   Specimen: Urine, Random  Result Value Ref Range  Status   Specimen Description   Final    URINE, RANDOM Performed at [facility], [address], [place], [place] [address]    Special Requests   Final    NONE Performed at [facility], [address], [place], [place] [address]    Culture   Final    NO GROWTH Performed at [facility], [place] [address] [place],  [address]    Report Status 04/14/2020 FINAL  Final         Radiology Studies: No results found.      Scheduled Meds: . aspirin EC  81 mg Oral Daily  . Chlorhexidine Gluconate Cloth  6 each Topical Q0600  . famotidine  20 mg Oral QHS  . heparin  5,000 Units Subcutaneous Q8H  . insulin aspart  0-6 Units Subcutaneous TID WC  . midodrine  5 mg Oral TID WC  . sodium chloride flush  3 mL Intravenous Q12H   Continuous Infusions: . sodium chloride 100 mL/hr at 04/15/20 1346     LOS: 2 days    Time spent: 35 minutes    Sidney Ace, MD [facility] Pager [phone]  If 7PM-7AM, please contact night-coverage 04/15/2020, 3:16 PM

## 2020-04-15 NOTE — Progress Notes (Signed)
Pt resting in bed, reports feeling well.  Foley cath remains in place for urinary retention, pt still has trialysis catheter to right fem in case of need for emergent dialysis.  Blood pressure has been stable with small cuff on right forearm.  No new complaints.

## 2020-04-15 NOTE — Progress Notes (Signed)
[address] [place], [place] [address] Phone [phone]. Fax [phone]  Date: 04/15/2020                  Patient Name:  Jasmine Davidson  MRN: [id number]  DOB: 06/09/1956  Age / Sex: 64 y.o., female         PCP: Alene Mires Elyse Jarvis, MD                 Service Requesting Consult:  Internal medicine/ Sidney Ace, MD                 Reason for Consult:  Hyperkalemia and acute renal failure            History of Present Illness: Patient is a 64 y.o. female with medical problems of, coronary disease, diabetes, hyperlipidemia, who was admitted to [facility] on 04/13/2020 for evaluation of Dehydration [E86.0] Hyperkalemia [E87.5] AKI (acute kidney injury) ([place]) [N17.9] Acute renal failure superimposed on stage 3 chronic kidney disease, unspecified acute renal failure type, unspecified whether stage 3a or 3b CKD ([employer]) [N17.9, N18.30]  Patient underwent acute dialysis at admission for hyperkalemia Potassium level is now in the normal range She is now able to eat and drink without nausea or vomiting. Good urine output Husband at bedside No shortness of breath or leg edema    Current medications: Current Facility-Administered Medications  Medication Dose Route Frequency Provider Last Rate Last Admin  . 0.9 %  sodium chloride infusion   Intravenous Continuous Murlean Iba, MD 100 mL/hr at 04/15/20 1200 Rate Verify at 04/15/20 1200  . acetaminophen (TYLENOL) tablet 650 mg  650 mg Oral Q6H PRN Opyd, Ilene Qua, MD       Or  . acetaminophen (TYLENOL) suppository 650 mg  650 mg Rectal Q6H PRN Opyd, Ilene Qua, MD      . aspirin EC tablet 81 mg  81 mg Oral Daily Priscella Mann, Sudheer B, MD   81 mg at 04/15/20 1136  . Chlorhexidine Gluconate Cloth 2 % PADS 6 each  6 each Topical Q0600 Murlean Iba, MD   6 each at 04/15/20 0504  . famotidine (PEPCID) tablet 20 mg  20 mg Oral QHS Charlett Nose, RPH   20 mg at 04/14/20 2119  . heparin injection 5,000 Units  5,000 Units  Subcutaneous Q8H Vianne Bulls, MD   5,000 Units at 04/15/20 0504  . insulin aspart (novoLOG) injection 0-6 Units  0-6 Units Subcutaneous TID WC Opyd, Ilene Qua, MD   2 Units at 04/15/20 1136  . midodrine (PROAMATINE) tablet 5 mg  5 mg Oral TID WC Lang Snow, NP   5 mg at 04/15/20 1136  . ondansetron (ZOFRAN) tablet 4 mg  4 mg Oral Q6H PRN Opyd, Ilene Qua, MD       Or  . ondansetron (ZOFRAN) injection 4 mg  4 mg Intravenous Q6H PRN Opyd, Ilene Qua, MD   4 mg at 04/13/20 0436  . sodium chloride flush (NS) 0.9 % injection 3 mL  3 mL Intravenous Q12H Opyd, Ilene Qua, MD   3 mL at 04/14/20 2119     Vital Signs: Blood pressure 122/76, pulse 62, temperature 98.2 F (36.8 C), temperature source Oral, resp. rate 11, height 5\' 6"  (1.676 m), weight 115.9 kg, last menstrual period 09/12/1999, SpO2 96 %.   Intake/Output Summary (Last 24 hours) at 04/15/2020 1301 Last data filed at 04/15/2020 1200 Gross per 24 hour  Intake 3775.3 ml  Output 3950  ml  Net -174.7 ml    Weight trends: Filed Weights   04/12/20 2145 04/13/20 1004  Weight: 109.8 kg 115.9 kg    Physical Exam: General:  No acute distress, laying in the bed  HEENT  dry oral mucous membranes  Neck:  Supple, no masses  Lungs:  Normal breathing effort on room air, clear to auscultation  Heart::  Regular rhythm, sinus on telemetry  Abdomen:  Soft,  Extremities:  No peripheral edema  Neurologic:  Alert, oriented  Skin:  No acute rashes  Access:  Right femoral temp cath  Foley:  In place       Lab results: Basic Metabolic Panel: Recent Labs  Lab 04/13/20 1126 04/13/20 1126 04/13/20 1642 04/14/20 0402 04/15/20 0417  NA 137  --   --  135 138  K 6.9*   < > 4.5 4.5 3.9  CL 94*  --   --  96* 99  CO2 12*  --   --  25 28  GLUCOSE 163*  --   --  156* 164*  BUN 105*  --   --  79* 82*  CREATININE 9.43*  --   --  6.93* 5.75*  CALCIUM 8.9  --   --  8.0* 8.2*  MG  --   --   --  1.9 1.9  PHOS 9.3*  --   --  7.6* 4.6   <  > = values in this interval not displayed.    Liver Function Tests: No results for input(s): AST, ALT, ALKPHOS, BILITOT, PROT, ALBUMIN in the last 168 hours. No results for input(s): LIPASE, AMYLASE in the last 168 hours. No results for input(s): AMMONIA in the last 168 hours.  CBC: Recent Labs  Lab 04/14/20 0402 04/15/20 0417  WBC 7.7 5.1  HGB 11.9* 11.0*  HCT 34.2* 33.4*  MCV 89.3 92.8  PLT 157 129*    Cardiac Enzymes: No results for input(s): CKTOTAL, TROPONINI in the last 168 hours.  BNP: Invalid input(s): POCBNP  CBG: Recent Labs  Lab 04/14/20 1128 04/14/20 1540 04/14/20 2136 04/15/20 0713 04/15/20 1109  GLUCAP 165* 180* 181* 137* 226*    Microbiology: Recent Results (from the past 720 hour(s))  Respiratory Panel by RT PCR (Flu A&B, Covid) - Nasopharyngeal Swab     Status: None   Collection Time: 04/13/20 12:40 AM   Specimen: Nasopharyngeal Swab  Result Value Ref Range Status   SARS Coronavirus 2 by RT PCR NEGATIVE NEGATIVE Final    Comment: (NOTE) SARS-CoV-2 target nucleic acids are NOT DETECTED. The SARS-CoV-2 RNA is generally detectable in upper respiratoy specimens during the acute phase of infection. The lowest concentration of SARS-CoV-2 viral copies this assay can detect is 131 copies/mL. A negative result does not preclude SARS-Cov-2 infection and should not be used as the sole basis for treatment or other patient management decisions. A negative result may occur with  improper specimen collection/handling, submission of specimen other than nasopharyngeal swab, presence of viral mutation(s) within the areas targeted by this assay, and inadequate number of viral copies (<131 copies/mL). A negative result must be combined with clinical observations, patient history, and epidemiological information. The expected result is Negative. Fact Sheet for Patients:  PinkCheek.be Fact Sheet for Healthcare Providers:   GravelBags.it This test is not yet ap proved or cleared by the [place] FDA and  has been authorized for detection and/or diagnosis of SARS-CoV-2 by FDA under an Emergency Use Authorization (EUA). This EUA will remain  in  effect (meaning this test can be used) for the duration of the COVID-19 declaration under Section 564(b)(1) of the Act, 21 U.S.C. section 360bbb-3(b)(1), unless the authorization is terminated or revoked sooner.    Influenza A by PCR NEGATIVE NEGATIVE Final   Influenza B by PCR NEGATIVE NEGATIVE Final    Comment: (NOTE) The Xpert Xpress SARS-CoV-2/FLU/RSV assay is intended as an aid in  the diagnosis of influenza from Nasopharyngeal swab specimens and  should not be used as a sole basis for treatment. Nasal washings and  aspirates are unacceptable for Xpert Xpress SARS-CoV-2/FLU/RSV  testing. Fact Sheet for Patients: PinkCheek.be Fact Sheet for Healthcare Providers: GravelBags.it This test is not yet approved or cleared by the [place] FDA and  has been authorized for detection and/or diagnosis of SARS-CoV-2 by  FDA under an Emergency Use Authorization (EUA). This EUA will remain  in effect (meaning this test can be used) for the duration of the  Covid-19 declaration under Section 564(b)(1) of the Act, 21  U.S.C. section 360bbb-3(b)(1), unless the authorization is  terminated or revoked. Performed at [facility], [address] [place], [place] [address]   MRSA PCR Screening     Status: None   Collection Time: 04/13/20 10:08 AM   Specimen: Nasopharyngeal  Result Value Ref Range Status   MRSA by PCR NEGATIVE NEGATIVE Final    Comment:        The GeneXpert MRSA Assay (FDA approved for NASAL specimens only), is one component of a comprehensive MRSA colonization surveillance program. It is not intended to diagnose MRSA infection nor to guide or monitor  treatment for MRSA infections. Performed at [facility], [address] [place], [place] [address]   Culture, Urine     Status: None   Collection Time: 04/13/20 11:55 PM   Specimen: Urine, Random  Result Value Ref Range Status   Specimen Description   Final    URINE, RANDOM Performed at [facility], [address] [place], [place] [address]    Special Requests   Final    NONE Performed at [facility], [address] [place], [place] [address]    Culture   Final    NO GROWTH Performed at [facility], [place] [address], [place], [place] [address]    Report Status 04/14/2020 FINAL  Final     Coagulation Studies: No results for input(s): LABPROT, INR in the last 72 hours.  Urinalysis: Recent Labs    04/13/20 2355  COLORURINE YELLOW*  LABSPEC 1.006  PHURINE 5.0  GLUCOSEU >=500*  HGBUR SMALL*  BILIRUBINUR NEGATIVE  KETONESUR 5*  PROTEINUR 30*  NITRITE NEGATIVE  LEUKOCYTESUR NEGATIVE        Imaging: No results found.   Assessment & Plan: Pt is a 64 y.o. Caucasian  female with  Hypertension Diabetes with retinopathy and nephropathy Coronary disease Chronic kidney disease stage IIIb.  Baseline creatinine 1.42/GFR February 12, 2020 Chronic foot ulcers Obesity,  was admitted on 04/13/2020 with Dehydration [E86.0] Hyperkalemia [E87.5] AKI (acute kidney injury) ([place]) [N17.9] Acute renal failure superimposed on stage 3 chronic kidney disease, unspecified acute renal failure type, unspecified whether stage 3a or 3b CKD ([facility]) [N17.9, N18.30]  #Acute kidney injury with severe hyperkalemia #Chronic kidney disease stage IIIb.  Baseline creatinine 1.42/GFR 40 from March 3 renal ultrasound is negative for obstruction Outpatient medications -Jardiance, furosemide, hydrochlorothiazide, lisinopril, Metformin should be held in current setting of acute kidney injury Lab Results  Component Value Date  CREATININE 5.75 (H) 04/15/2020    CREATININE 6.93 (H) 04/14/2020   CREATININE 9.43 (H) 04/13/2020   Agree with IV volume resuscitation with normal saline Potassium level has now corrected. Urine output has improved. Electrolytes and volume status are acceptable.  No acute indication for dialysis 05/04 0701 - 05/05 0700 In: 1953 [P.O.:750; I.V.:1203] Out: 3250 [Urine:3250] Anticipate removal of dialysis catheter tomorrow morning Can do a voiding trial after removing Foley catheter tomorrow morning also.  #Diabetes type 2 with complications Lab Results  Component Value Date   HGBA1C 6.6 (H) 04/13/2020            LOS: 2 Axiel Fjeld 5/5/20211:01 PM    Note: This note was prepared with Dragon dictation. Any transcription errors are unintentional

## 2020-04-16 LAB — PHOSPHORUS: Phosphorus: 3 mg/dL (ref 2.5–4.6)

## 2020-04-16 LAB — BASIC METABOLIC PANEL
Anion gap: 6 (ref 5–15)
BUN: 63 mg/dL — ABNORMAL HIGH (ref 8–23)
CO2: 27 mmol/L (ref 22–32)
Calcium: 8 mg/dL — ABNORMAL LOW (ref 8.9–10.3)
Chloride: 104 mmol/L (ref 98–111)
Creatinine, Ser: 3.8 mg/dL — ABNORMAL HIGH (ref 0.44–1.00)
GFR calc Af Amer: 14 mL/min — ABNORMAL LOW (ref >60)
GFR calc non Af Amer: 12 mL/min — ABNORMAL LOW (ref >60)
Glucose, Bld: 164 mg/dL — ABNORMAL HIGH (ref 70–99)
Potassium: 3.7 mmol/L (ref 3.5–5.1)
Sodium: 137 mmol/L (ref 135–145)

## 2020-04-16 LAB — ANCA TITERS
Atypical P-ANCA titer: <1:20 {titer}
C-ANCA: <1:20 {titer}
P-ANCA: <1:20 {titer}

## 2020-04-16 LAB — GLUCOSE, CAPILLARY
Glucose-Capillary: 142 mg/dL — ABNORMAL HIGH (ref 70–99)
Glucose-Capillary: 241 mg/dL — ABNORMAL HIGH (ref 70–99)
Glucose-Capillary: 253 mg/dL — ABNORMAL HIGH (ref 70–99)
Glucose-Capillary: 255 mg/dL — ABNORMAL HIGH (ref 70–99)

## 2020-04-16 LAB — MAGNESIUM: Magnesium: 1.6 mg/dL — ABNORMAL LOW (ref 1.7–2.4)

## 2020-04-16 MED ORDER — MAGNESIUM SULFATE 2 GM/50ML IV SOLN
2.0000 g | Freq: Once | INTRAVENOUS | Status: AC
  Administered 2020-04-16: 2 g via INTRAVENOUS
  Filled 2020-04-16: qty 50

## 2020-04-16 MED ORDER — CALCIUM CARBONATE ANTACID 500 MG PO CHEW: 2.0000 | CHEWABLE_TABLET | Freq: Three times a day (TID) | ORAL | Status: DC | PRN

## 2020-04-16 NOTE — Progress Notes (Signed)
PT Cancellation Note  Patient Details Name: Jasmine Davidson MRN: [id number] DOB: 02-15-1956   Cancelled Treatment:    Reason Eval/Treat Not Completed: Medical issues which prohibited therapy(Pt still has Rt groin temporary HD cath in place which precludes OOB mobility with our department. Per protocol, will hold PT evaluation until later date/time.)  8:29 AM, 04/16/20 Etta Grandchild, PT, DPT Physical Therapist - [facility]  [phone] ([place])    [place] C 04/16/2020, 8:29 AM

## 2020-04-16 NOTE — Progress Notes (Signed)
[address] [place], [place] [address] Phone [phone]. Fax [phone]  Date: 04/16/2020                  Patient Name:  Jasmine Davidson  MRN: [id number]  DOB: February 27, 1956  Age / Sex: 64 y.o., female         PCP: Alene Mires Elyse Jarvis, MD                 Service Requesting Consult:  Internal medicine/ Sidney Ace, MD                 Reason for Consult:  Hyperkalemia and acute renal failure            History of Present Illness: Patient is a 64 y.o. female with medical problems of, coronary disease, diabetes, hyperlipidemia, who was admitted to [facility] on 04/13/2020 for evaluation of Dehydration [E86.0] Hyperkalemia [E87.5] AKI (acute kidney injury) ([place]) [N17.9] Acute renal failure superimposed on stage 3 chronic kidney disease, unspecified acute renal failure type, unspecified whether stage 3a or 3b CKD ([place]) [N17.9, N18.30]  Patient underwent acute dialysis at admission for hyperkalemia Potassium level is now in the normal range She is now able to eat and drink without nausea or vomiting. Good urine output Husband at bedside No shortness of breath or leg edema    Current medications: Current Facility-Administered Medications  Medication Dose Route Frequency Provider Last Rate Last Admin  . 0.9 %  sodium chloride infusion   Intravenous Continuous Murlean Iba, MD 100 mL/hr at 04/16/20 0603 New Bag at 04/16/20 0603  . acetaminophen (TYLENOL) tablet 650 mg  650 mg Oral Q6H PRN Opyd, Ilene Qua, MD       Or  . acetaminophen (TYLENOL) suppository 650 mg  650 mg Rectal Q6H PRN Opyd, Ilene Qua, MD      . aspirin EC tablet 81 mg  81 mg Oral Daily Ralene Muskrat B, MD   81 mg at 04/16/20 0909  . Chlorhexidine Gluconate Cloth 2 % PADS 6 each  6 each Topical Q0600 Murlean Iba, MD   6 each at 04/16/20 [phone]  . famotidine (PEPCID) tablet 20 mg  20 mg Oral QHS Charlett Nose, RPH   20 mg at 04/15/20 2239  . heparin injection 5,000 Units  5,000 Units  Subcutaneous Q8H Vianne Bulls, MD   5,000 Units at 04/16/20 [phone]  . insulin aspart (novoLOG) injection 0-6 Units  0-6 Units Subcutaneous TID WC Opyd, Ilene Qua, MD   2 Units at 04/15/20 1638  . midodrine (PROAMATINE) tablet 5 mg  5 mg Oral TID WC Lang Snow, NP   5 mg at 04/16/20 0909  . ondansetron (ZOFRAN) tablet 4 mg  4 mg Oral Q6H PRN Opyd, Ilene Qua, MD       Or  . ondansetron (ZOFRAN) injection 4 mg  4 mg Intravenous Q6H PRN Opyd, Ilene Qua, MD   4 mg at 04/13/20 0436  . sodium chloride flush (NS) 0.9 % injection 3 mL  3 mL Intravenous Q12H Opyd, Ilene Qua, MD   3 mL at 04/14/20 2119     Vital Signs: Blood pressure (!) 142/75, pulse 65, temperature 98.5 F (36.9 C), temperature source Oral, resp. rate 14, height 5\' 6"  (1.676 m), weight 119.1 kg, last menstrual period 09/12/1999, SpO2 98 %.   Intake/Output Summary (Last 24 hours) at 04/16/2020 1206 Last data filed at 04/16/2020 1015 Gross per 24 hour  Intake 1260 ml  Output  1625 ml  Net -365 ml    Weight trends: Filed Weights   04/12/20 2145 04/13/20 1004 04/15/20 1357  Weight: 109.8 kg 115.9 kg 119.1 kg    Physical Exam: General:  No acute distress, laying in the bed  HEENT  dry oral mucous membranes  Neck:  Supple, no masses  Lungs:  Normal breathing effort on room air, clear to auscultation  Heart::  Regular rhythm, sinus on telemetry  Abdomen:  Soft,  Extremities:  No peripheral edema  Neurologic:  Alert, oriented  Skin:  No acute rashes  Access:  Right femoral temp cath          Lab results: Basic Metabolic Panel: Recent Labs  Lab 04/14/20 0402 04/15/20 0417 04/16/20 0412  NA 135 138 137  K 4.5 3.9 3.7  CL 96* 99 104  CO2 25 28 27   GLUCOSE 156* 164* 164*  BUN 79* 82* 63*  CREATININE 6.93* 5.75* 3.80*  CALCIUM 8.0* 8.2* 8.0*  MG 1.9 1.9 1.6*  PHOS 7.6* 4.6 3.0    Liver Function Tests: No results for input(s): AST, ALT, ALKPHOS, BILITOT, PROT, ALBUMIN in the last 168 hours. No  results for input(s): LIPASE, AMYLASE in the last 168 hours. No results for input(s): AMMONIA in the last 168 hours.  CBC: Recent Labs  Lab 04/14/20 0402 04/15/20 0417  WBC 7.7 5.1  HGB 11.9* 11.0*  HCT 34.2* 33.4*  MCV 89.3 92.8  PLT 157 129*    Cardiac Enzymes: No results for input(s): CKTOTAL, TROPONINI in the last 168 hours.  BNP: Invalid input(s): POCBNP  CBG: Recent Labs  Lab 04/15/20 0713 04/15/20 1109 04/15/20 1635 04/15/20 2056 04/16/20 0747  GLUCAP 137* 226* 245* 227* 142*    Microbiology: Recent Results (from the past 720 hour(s))  Respiratory Panel by RT PCR (Flu A&B, Covid) - Nasopharyngeal Swab     Status: None   Collection Time: 04/13/20 12:40 AM   Specimen: Nasopharyngeal Swab  Result Value Ref Range Status   SARS Coronavirus 2 by RT PCR NEGATIVE NEGATIVE Final    Comment: (NOTE) SARS-CoV-2 target nucleic acids are NOT DETECTED. The SARS-CoV-2 RNA is generally detectable in upper respiratoy specimens during the acute phase of infection. The lowest concentration of SARS-CoV-2 viral copies this assay can detect is 131 copies/mL. A negative result does not preclude SARS-Cov-2 infection and should not be used as the sole basis for treatment or other patient management decisions. A negative result may occur with  improper specimen collection/handling, submission of specimen other than nasopharyngeal swab, presence of viral mutation(s) within the areas targeted by this assay, and inadequate number of viral copies (<131 copies/mL). A negative result must be combined with clinical observations, patient history, and epidemiological information. The expected result is Negative. Fact Sheet for Patients:  PinkCheek.be Fact Sheet for Healthcare Providers:  GravelBags.it This test is not yet ap proved or cleared by the [place] FDA and  has been authorized for detection and/or diagnosis of  SARS-CoV-2 by FDA under an Emergency Use Authorization (EUA). This EUA will remain  in effect (meaning this test can be used) for the duration of the COVID-19 declaration under Section 564(b)(1) of the Act, 21 U.S.C. section 360bbb-3(b)(1), unless the authorization is terminated or revoked sooner.    Influenza A by PCR NEGATIVE NEGATIVE Final   Influenza B by PCR NEGATIVE NEGATIVE Final    Comment: (NOTE) The Xpert Xpress SARS-CoV-2/FLU/RSV assay is intended as an aid in  the diagnosis of influenza from  Nasopharyngeal swab specimens and  should not be used as a sole basis for treatment. Nasal washings and  aspirates are unacceptable for Xpert Xpress SARS-CoV-2/FLU/RSV  testing. Fact Sheet for Patients: PinkCheek.be Fact Sheet for Healthcare Providers: GravelBags.it This test is not yet approved or cleared by the [place] FDA and  has been authorized for detection and/or diagnosis of SARS-CoV-2 by  FDA under an Emergency Use Authorization (EUA). This EUA will remain  in effect (meaning this test can be used) for the duration of the  Covid-19 declaration under Section 564(b)(1) of the Act, 21  U.S.C. section 360bbb-3(b)(1), unless the authorization is  terminated or revoked. Performed at [facility], [address] [place], [place] [address]   MRSA PCR Screening     Status: None   Collection Time: 04/13/20 10:08 AM   Specimen: Nasopharyngeal  Result Value Ref Range Status   MRSA by PCR NEGATIVE NEGATIVE Final    Comment:        The GeneXpert MRSA Assay (FDA approved for NASAL specimens only), is one component of a comprehensive MRSA colonization surveillance program. It is not intended to diagnose MRSA infection nor to guide or monitor treatment for MRSA infections. Performed at [facility], [address] [place], [place] [address]   Culture, Urine     Status: None   Collection Time:  04/13/20 11:55 PM   Specimen: Urine, Random  Result Value Ref Range Status   Specimen Description   Final    URINE, RANDOM Performed at [facility], [address], [place], [place] [address]    Special Requests   Final    NONE Performed at [facility], [address], [place], [place] [address]    Culture   Final    NO GROWTH Performed at [facility], [place] [address], [place], [place] [address]    Report Status 04/14/2020 FINAL  Final     Coagulation Studies: No results for input(s): LABPROT, INR in the last 72 hours.  Urinalysis: Recent Labs    04/13/20 2355  COLORURINE YELLOW*  LABSPEC 1.006  PHURINE 5.0  GLUCOSEU >=500*  HGBUR SMALL*  BILIRUBINUR NEGATIVE  KETONESUR 5*  PROTEINUR 30*  NITRITE NEGATIVE  LEUKOCYTESUR NEGATIVE        Imaging: No results found.   Assessment & Plan: Pt is a 64 y.o. Caucasian  female with  Hypertension Diabetes with retinopathy and nephropathy Coronary disease Chronic kidney disease stage IIIb.  Baseline creatinine 1.42/GFR February 12, 2020 Chronic foot ulcers Obesity,  was admitted on 04/13/2020 with Dehydration [E86.0] Hyperkalemia [E87.5] AKI (acute kidney injury) ([place]) [N17.9] Acute renal failure superimposed on stage 3 chronic kidney disease, unspecified acute renal failure type, unspecified whether stage 3a or 3b CKD ([facility]) [N17.9, N18.30]  #Acute kidney injury with severe hyperkalemia #Chronic kidney disease stage IIIb.  Baseline creatinine 1.42/GFR 40 from March 3 renal ultrasound is negative for obstruction Outpatient medications -Jardiance, furosemide, hydrochlorothiazide, lisinopril, Metformin should be held in current setting of acute kidney injury Lab Results  Component Value Date   CREATININE 3.80 (H) 04/16/2020   CREATININE 5.75 (H) 04/15/2020   CREATININE 6.93 (H) 04/14/2020   Agree with IV volume resuscitation with normal saline Potassium level has now corrected. Urine output  has improved. Electrolytes and volume status are acceptable.  No acute indication for dialysis 05/05 0701 - 05/06 0700 In: 2182.3 [P.O.:480; I.V.:1702.3] Out: 2325 [Urine:2325]  -Can consider reintroducing lisinopril Jardiance, Metformin can be restarted as  outpatient Recommend diuretics to be used as needed only  #Diabetes type 2 with complications Lab Results  Component Value Date   HGBA1C 6.6 (H) 04/13/2020            LOS: 3 Mollie Rossano 5/6/202112:06 PM    Note: This note was prepared with Dragon dictation. Any transcription errors are unintentional

## 2020-04-16 NOTE — TOC Initial Note (Signed)
Transition of Care [facility]) - Initial/Assessment Note    Patient Details  Name: Jasmine Davidson MRN: [id number] Date of Birth: 22-Nov-1956  Transition of Care [facility]) CM/SW Contact:    Shelbie Hutching, RN Phone Number: 04/16/2020, 11:12 AM  Clinical Narrative:                 Patient admitted with acute renal failure, history of stage 3 chronic kidney disease.  RNCM introduced self to patient and husband and explained role.  Patient is from home with her husband.  Patient is independent at home requiring no assistive devices.  Patient is current with her PCP at [facility] and gets her prescriptions from them.  Patient is open to have home health services if recommended by PT, they would like to use [facility].   Foley catheter was removed this morning, patient needs to void.  Patient also must have a bowel movement before discharge, patient reports last one was 7 days ago.  Femoral Temp cath will also be removed today. RNCM will cont to follow.   Expected Discharge Plan: [place] Barriers to Discharge: Continued Medical Work up   Patient Goals and CMS Choice Patient states their goals for this hospitalization and ongoing recovery are:: wants to be able to urinate on her own CMS Medicare.gov Compare Post Acute Care list provided to:: Patient Choice offered to / list presented to : Patient  Expected Discharge Plan and Services Expected Discharge Plan: [place]   Discharge Planning Services: CM Consult Post Acute Care Choice: [place] arrangements for the past 2 months: Single Family Home                                      Prior Living Arrangements/Services Living arrangements for the past 2 months: Single Family Home Lives with:: Spouse Patient language and need for interpreter reviewed:: Yes Do you feel safe going back to the place where you live?: Yes      Need for Family Participation in Patient Care: Yes  (Comment)(renal failure, weak from hospital stay) Care giver support system in place?: Yes (comment)(husband)   Criminal Activity/Legal Involvement Pertinent to Current Situation/Hospitalization: No - Comment as needed  Activities of Daily Living Home Assistive Devices/Equipment: Eyeglasses(for reading only) ADL Screening (condition at time of admission) Patient's cognitive ability adequate to safely complete daily activities?: Yes Is the patient deaf or have difficulty hearing?: No Does the patient have difficulty seeing, even when wearing glasses/contacts?: No Does the patient have difficulty concentrating, remembering, or making decisions?: No Patient able to express need for assistance with ADLs?: Yes Does the patient have difficulty dressing or bathing?: No Independently performs ADLs?: Yes (appropriate for developmental age) Does the patient have difficulty walking or climbing stairs?: No Weakness of Legs: None Weakness of Arms/Hands: None  Permission Sought/Granted Permission sought to share information with : Case Manager, Customer service manager, Family Supports Permission granted to share information with : Yes, Verbal Permission Granted  Share Information with NAME: [name]  Permission granted to share info w AGENCY: [employer]  Permission granted to share info w Relationship: Husband     Emotional Assessment Appearance:: Appears stated age Attitude/Demeanor/Rapport: Engaged Affect (typically observed): Accepting Orientation: : Oriented to Self, Oriented to Place, Oriented to  Time, Oriented to Situation Alcohol / Substance Use: Not Applicable Psych Involvement: No (comment)  Admission diagnosis:  Dehydration [E86.0] Hyperkalemia [E87.5] AKI (acute kidney injury) ([place]) [N17.9] Acute renal failure superimposed on stage 3 chronic kidney disease, unspecified acute renal failure type, unspecified whether stage 3a or 3b CKD ([place]) [N17.9, N18.30] Patient Active  Problem List   Diagnosis Date Noted  . Acute renal failure superimposed on stage 3 chronic kidney disease ([place]) 04/13/2020  . Hyperkalemia   . Cellulitis 08/08/2017  . Insulin-requiring or dependent type II diabetes mellitus ([place]) 11/11/2010  . HYPERLIPIDEMIA-MIXED 11/11/2010  . HTN (hypertension) 11/11/2010  . CAD, NATIVE VESSEL 11/11/2010  . 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 CAROTID ARTERY STENOSIS, WITHOUT INFARCTION 11/11/2010  . SYNCOPE AND COLLAPSE 11/11/2010   PCP:  Theotis Burrow, MD Pharmacy:   [facility] [address] [place] [address] [place] [address] Phone: [phone] Fax: [place], [place] [address] Phone: [phone] Fax: [phone]     Social Determinants of Health (SDOH) Interventions    Readmission Risk Interventions No flowsheet data found.

## 2020-04-16 NOTE — Progress Notes (Signed)
OT Cancellation Note  Patient Details Name: Jasmine Davidson MRN: [id number] DOB: March 21, 1956   Cancelled Treatment:    Reason Eval/Treat Not Completed: Medical issues which prohibited therapy  OT consult received and chart reviewed. Pt still with temporary HD femoral catheter in place at this time. Mobilization prohibited until after removal. Will withhold OT evaluation at this time and f/u as pt becomes more appropriate for therapy participation. Thank you.  Gerrianne Scale, [place], OTR/L ascom [phone] 04/16/20, 8:51 AM

## 2020-04-16 NOTE — Progress Notes (Addendum)
Trialysis catheter removed per order Dr. Priscella Mann. Tip intact, all of catheter intact 30cm.

## 2020-04-16 NOTE — Progress Notes (Signed)
PROGRESS NOTE    Jasmine Davidson  YQI:[id number] DOB: 12-24-1955 DOA: 04/13/2020 PCP: Theotis Burrow, MD    Brief Narrative: Jasmine Citron Swangeris a 64 y.o.femalewith medical history significant forhypertension, type 2 diabetes mellitus, coronary artery disease, chronic kidney disease stage III,chronic foot ulcers,and BMI 39, Admitted 04/13/2020 with dizziness, malaise, nausea and almost intractable vomiting.  She was found to be in acute kidney failure with a creatinine of 9.3, increased from 1.4 in March.  Patient was noted to have potassium of 7.2 the following morning and started emergency dialysis.  5/5: Patient seen and examined.  Husband at bedside.  Stable.  Energy level still low but improving.  Feeling a little better.  Intractable vomiting has decreased.  Patient had some issues with urinary retention.  Foley catheter in place.  5/6: Patient seen and examined.  Husband at bedside.  Transfer to [place] floor.  Remains stable.  Energy level still low but improving.  No vomiting.  Tolerating p.o.  Case discussed with nephrology.  No further plans for dialysis.  Okay to discontinue Foley catheter and temporary dialysis catheter.   Assessment & Plan:   Principal Problem:   Acute renal failure superimposed on stage 3 chronic kidney disease (HCC) Active Problems:   Insulin-requiring or dependent type II diabetes mellitus (HCC)   HTN (hypertension)   CAD, NATIVE VESSEL   Hyperkalemia  Acute kidney failure Patient has known baseline diabetic nephropathy with CKD 3B followed by Dr. Radene Knee at [facility]. Unclear when patient developed acute kidney failure, but symptoms have been present for 1 week.  Patient was initially thought to be an uric however bladder scan after 5 L fluid resuscitation revealed 1800 cc urine in the bladder which was treated with in and out catheterization.  Renal ultrasound showed no hydronephrosis or stone.  UA has been sent with microscopy, complement level  C3-C4 are within normal limits. Patient denies any new medications in the past couple of months, although she was diagnosed with Covid in December subsequent creatinines have been at baseline. Plan: Continue with IV fluid hydration DC Foley catheter, voiding trial DC Vas-Cath Check a.m. BMP Continue to hold nephrotoxins but anticipate that we can restart lisinopril tomorrow Metformin will be held on discharge Diuretics to be used only as needed post discharge  HTN Continue to hold all home antihypertensives including Lasix, lisinopril, Toprol-XL, verapamil and HCTZ   DM 2 Patient has had DM2 x30 years and has been complicated by diabetic nephropathy, retinopathy and neuropathy.  She is usually on Metformin, liraglutide, Jardiance and insulin 70/30, all of which are being held.  She is presently on sliding scale coverage with aspart and blood sugars are presently well controlled.  Patient is not eating very much.   DVT prophylaxis: Subcu heparin Code Status: Full code Family Communication: Husband at bedside Disposition Plan: Status is: Inpatient  Remains inpatient appropriate because:Inpatient level of care appropriate due to severity of illness   Dispo: The patient is from: Home              Anticipated d/c is to: Home              Anticipated d/c date is: 1 day              Patient currently is not medically stable to d/c.    Creatinine improving but still not at baseline.  Removing Foley catheter today.  Voiding trial.  Anticipate medical readiness for discharge within 1 to 2 days.  Consultants:   Nephrology- [facility]  Procedures:   Hemodialysis catheter placement  Foley catheterization  Antimicrobials:   None   Subjective: Seen and examined Husband at bedside Improving fatigue  Objective: Vitals:   04/15/20 1300 04/15/20 1357 04/15/20 2300 04/16/20 0746  BP: (!) 141/65 (!) 131/46 (!) 106/40 (!) 142/75  Pulse: 60 67 65   Resp: (!) 9  13  14   Temp:  97.9 F (36.6 C) 98.1 F (36.7 C) 98.5 F (36.9 C)  TempSrc:  Oral Oral Oral  SpO2: 100% 100% 98% 98%  Weight:  119.1 kg    Height:  5\' 6"  (1.676 m)      Intake/Output Summary (Last 24 hours) at 04/16/2020 1222 Last data filed at 04/16/2020 1015 Gross per 24 hour  Intake 1260 ml  Output 1625 ml  Net -365 ml   Filed Weights   04/12/20 2145 04/13/20 1004 04/15/20 1357  Weight: 109.8 kg 115.9 kg 119.1 kg    Examination:  General: Weak tired appearing female sitting up in bed trying to eat breakfast.  Attentive husband at bedside. Eyes: sclera anicteric, conjuctiva mild injection bilaterally CVS: S1-S2, regular  Respiratory:  decreased air entry bilaterally secondary to decreased inspiratory effort, rales at bases  GI: NABS, soft, NT  LE: No edema.  Neuro: A/O x 3, Moving all extremities equally with normal strength, CN 3-12 intact, grossly nonfocal.  Psych: patient is logical and coherent, judgement and insight appear normal, mood and affect appropriate to situation.    Data Reviewed: I have personally reviewed following labs and imaging studies  CBC: Recent Labs  Lab 04/12/20 2149 04/13/20 0616 04/14/20 0402 04/15/20 0417  WBC 8.3 9.8 7.7 5.1  HGB 14.2 13.9 11.9* 11.0*  HCT 43.0 43.4 34.2* 33.4*  MCV 93.1 95.6 89.3 92.8  PLT 185 184 157 696*   Basic Metabolic Panel: Recent Labs  Lab 04/13/20 0616 04/13/20 0616 04/13/20 1126 04/13/20 1642 04/14/20 0402 04/15/20 0417 04/16/20 0412  NA 134*  --  137  --  135 138 137  K 7.2*   < > 6.9* 4.5 4.5 3.9 3.7  CL 95*  --  94*  --  96* 99 104  CO2 12*  --  12*  --  25 28 27   GLUCOSE 155*  --  163*  --  156* 164* 164*  BUN 102*  --  105*  --  79* 82* 63*  CREATININE 9.21*  --  9.43*  --  6.93* 5.75* 3.80*  CALCIUM 8.8*  --  8.9  --  8.0* 8.2* 8.0*  MG  --   --   --   --  1.9 1.9 1.6*  PHOS  --   --  9.3*  --  7.6* 4.6 3.0   < > = values in this interval not displayed.   GFR: Estimated Creatinine  Clearance: 19.9 mL/min (A) (by C-G formula based on SCr of 3.8 mg/dL (H)). Liver Function Tests: No results for input(s): AST, ALT, ALKPHOS, BILITOT, PROT, ALBUMIN in the last 168 hours. No results for input(s): LIPASE, AMYLASE in the last 168 hours. No results for input(s): AMMONIA in the last 168 hours. Coagulation Profile: No results for input(s): INR, PROTIME in the last 168 hours. Cardiac Enzymes: No results for input(s): CKTOTAL, CKMB, CKMBINDEX, TROPONINI in the last 168 hours. BNP (last 3 results) No results for input(s): PROBNP in the last 8760 hours. HbA1C: No results for input(s): HGBA1C in the last 72 hours. CBG: Recent Labs  Lab  04/15/20 1109 04/15/20 1635 04/15/20 2056 04/16/20 0747 04/16/20 1206  GLUCAP 226* 245* 227* 142* 241*   Lipid Profile: No results for input(s): CHOL, HDL, LDLCALC, TRIG, CHOLHDL, LDLDIRECT in the last 72 hours. Thyroid Function Tests: No results for input(s): TSH, T4TOTAL, FREET4, T3FREE, THYROIDAB in the last 72 hours. Anemia Panel: No results for input(s): VITAMINB12, FOLATE, FERRITIN, TIBC, IRON, RETICCTPCT in the last 72 hours. Sepsis Labs: No results for input(s): PROCALCITON, LATICACIDVEN in the last 168 hours.  Recent Results (from the past 240 hour(s))  Respiratory Panel by RT PCR (Flu A&B, Covid) - Nasopharyngeal Swab     Status: None   Collection Time: 04/13/20 12:40 AM   Specimen: Nasopharyngeal Swab  Result Value Ref Range Status   SARS Coronavirus 2 by RT PCR NEGATIVE NEGATIVE Final    Comment: (NOTE) SARS-CoV-2 target nucleic acids are NOT DETECTED. The SARS-CoV-2 RNA is generally detectable in upper respiratoy specimens during the acute phase of infection. The lowest concentration of SARS-CoV-2 viral copies this assay can detect is 131 copies/mL. A negative result does not preclude SARS-Cov-2 infection and should not be used as the sole basis for treatment or other patient management decisions. A negative result may  occur with  improper specimen collection/handling, submission of specimen other than nasopharyngeal swab, presence of viral mutation(s) within the areas targeted by this assay, and inadequate number of viral copies (<131 copies/mL). A negative result must be combined with clinical observations, patient history, and epidemiological information. The expected result is Negative. Fact Sheet for Patients:  PinkCheek.be Fact Sheet for Healthcare Providers:  GravelBags.it This test is not yet ap proved or cleared by the [place] FDA and  has been authorized for detection and/or diagnosis of SARS-CoV-2 by FDA under an Emergency Use Authorization (EUA). This EUA will remain  in effect (meaning this test can be used) for the duration of the COVID-19 declaration under Section 564(b)(1) of the Act, 21 U.S.C. section 360bbb-3(b)(1), unless the authorization is terminated or revoked sooner.    Influenza A by PCR NEGATIVE NEGATIVE Final   Influenza B by PCR NEGATIVE NEGATIVE Final    Comment: (NOTE) The Xpert Xpress SARS-CoV-2/FLU/RSV assay is intended as an aid in  the diagnosis of influenza from Nasopharyngeal swab specimens and  should not be used as a sole basis for treatment. Nasal washings and  aspirates are unacceptable for Xpert Xpress SARS-CoV-2/FLU/RSV  testing. Fact Sheet for Patients: PinkCheek.be Fact Sheet for Healthcare Providers: GravelBags.it This test is not yet approved or cleared by the [place] FDA and  has been authorized for detection and/or diagnosis of SARS-CoV-2 by  FDA under an Emergency Use Authorization (EUA). This EUA will remain  in effect (meaning this test can be used) for the duration of the  Covid-19 declaration under Section 564(b)(1) of the Act, 21  U.S.C. section 360bbb-3(b)(1), unless the authorization is  terminated or  revoked. Performed at [facility], [address] [place], [place] [address]   MRSA PCR Screening     Status: None   Collection Time: 04/13/20 10:08 AM   Specimen: Nasopharyngeal  Result Value Ref Range Status   MRSA by PCR NEGATIVE NEGATIVE Final    Comment:        The GeneXpert MRSA Assay (FDA approved for NASAL specimens only), is one component of a comprehensive MRSA colonization surveillance program. It is not intended to diagnose MRSA infection nor to guide or monitor treatment for MRSA infections. Performed at [facility], [phone] [place], [place] [address]   Culture, Urine     Status: None   Collection Time: 04/13/20 11:55 PM   Specimen: Urine, Random  Result Value Ref Range Status   Specimen Description   Final    URINE, RANDOM Performed at [facility], [address], [place], [place] [address]    Special Requests   Final    NONE Performed at [facility], [address], [place], [place] [address]    Culture   Final    NO GROWTH Performed at [facility], [place] [address], [place], [place] [address]    Report Status 04/14/2020 FINAL  Final         Radiology Studies: No results found.      Scheduled Meds: . aspirin EC  81 mg Oral Daily  . Chlorhexidine Gluconate Cloth  6 each Topical Q0600  . famotidine  20 mg Oral QHS  . heparin  5,000 Units Subcutaneous Q8H  . insulin aspart  0-6 Units Subcutaneous TID WC  . midodrine  5 mg Oral TID WC  . sodium chloride flush  3 mL Intravenous Q12H   Continuous Infusions: . sodium chloride 100 mL/hr at 04/16/20 0603     LOS: 3 days    Time spent: 35 minutes    Sidney Ace, MD [facility] Pager [phone]  If 7PM-7AM, please contact night-coverage 04/16/2020, 12:22 PM

## 2020-04-16 NOTE — Care Management Important Message (Signed)
Important Message  Patient Details  Name: Jasmine Davidson MRN: [id number] Date of Birth: 03-05-1956   Medicare Important Message Given:  Yes     [name] A Gur[place]et [place]are[place] 04/16/2020, 10:40 AM

## 2020-04-17 ENCOUNTER — Encounter: Payer: Self-pay | Admitting: Internal Medicine

## 2020-04-17 ENCOUNTER — Inpatient Hospital Stay: Payer: Medicare HMO

## 2020-04-17 LAB — CBC WITH DIFFERENTIAL/PLATELET
Abs Immature Granulocytes: 0.02 10*3/uL (ref 0.00–0.07)
Basophils Absolute: 0 10*3/uL (ref 0.0–0.1)
Basophils Relative: 0 %
Eosinophils Absolute: 0.1 10*3/uL (ref 0.0–0.5)
Eosinophils Relative: 1 %
HCT: 34.8 % — ABNORMAL LOW (ref 36.0–46.0)
Hemoglobin: 11.8 g/dL — ABNORMAL LOW (ref 12.0–15.0)
Immature Granulocytes: 0 %
Lymphocytes Relative: 14 %
Lymphs Abs: 1 10*3/uL (ref 0.7–4.0)
MCH: 30.9 pg (ref 26.0–34.0)
MCHC: 33.9 g/dL (ref 30.0–36.0)
MCV: 91.1 fL (ref 80.0–100.0)
Monocytes Absolute: 0.4 10*3/uL (ref 0.1–1.0)
Monocytes Relative: 6 %
Neutro Abs: 5.4 10*3/uL (ref 1.7–7.7)
Neutrophils Relative %: 79 %
Platelets: 117 10*3/uL — ABNORMAL LOW (ref 150–400)
RBC: 3.82 MIL/uL — ABNORMAL LOW (ref 3.87–5.11)
RDW: 12.9 % (ref 11.5–15.5)
WBC: 6.9 10*3/uL (ref 4.0–10.5)
nRBC: 0 % (ref 0.0–0.2)

## 2020-04-17 LAB — BASIC METABOLIC PANEL
Anion gap: 8 (ref 5–15)
BUN: 40 mg/dL — ABNORMAL HIGH (ref 8–23)
CO2: 23 mmol/L (ref 22–32)
Calcium: 8.2 mg/dL — ABNORMAL LOW (ref 8.9–10.3)
Chloride: 106 mmol/L (ref 98–111)
Creatinine, Ser: 2.42 mg/dL — ABNORMAL HIGH (ref 0.44–1.00)
GFR calc Af Amer: 24 mL/min — ABNORMAL LOW (ref >60)
GFR calc non Af Amer: 21 mL/min — ABNORMAL LOW (ref >60)
Glucose, Bld: 173 mg/dL — ABNORMAL HIGH (ref 70–99)
Potassium: 3.6 mmol/L (ref 3.5–5.1)
Sodium: 137 mmol/L (ref 135–145)

## 2020-04-17 LAB — GLUCOSE, CAPILLARY
Glucose-Capillary: 145 mg/dL — ABNORMAL HIGH (ref 70–99)
Glucose-Capillary: 192 mg/dL — ABNORMAL HIGH (ref 70–99)
Glucose-Capillary: 172 mg/dL — ABNORMAL HIGH (ref 70–99)
Glucose-Capillary: 124 mg/dL — ABNORMAL HIGH (ref 70–99)

## 2020-04-17 LAB — PHOSPHORUS: Phosphorus: 2.3 mg/dL — ABNORMAL LOW (ref 2.5–4.6)

## 2020-04-17 LAB — MAGNESIUM: Magnesium: 1.9 mg/dL (ref 1.7–2.4)

## 2020-04-17 MED ORDER — BISACODYL 10 MG RE SUPP
10.0000 mg | Freq: Every day | RECTAL | Status: DC | PRN
  Administered 2020-04-17: 11:00:00 10 mg via RECTAL
  Filled 2020-04-17: qty 1

## 2020-04-17 MED ORDER — SENNOSIDES-DOCUSATE SODIUM 8.6-50 MG PO TABS
1.0000 | ORAL_TABLET | Freq: Two times a day (BID) | ORAL | Status: DC
  Administered 2020-04-17: 1 via ORAL
  Filled 2020-04-17 (×2): qty 1

## 2020-04-17 MED ORDER — POLYETHYLENE GLYCOL 3350 17 G PO PACK
17.0000 g | PACK | Freq: Every day | ORAL | Status: DC
  Administered 2020-04-17: 17 g via ORAL
  Filled 2020-04-17: qty 1

## 2020-04-17 MED ORDER — LISINOPRIL 20 MG PO TABS
20.0000 mg | ORAL_TABLET | Freq: Every day | ORAL | Status: DC
  Administered 2020-04-17: 20 mg via ORAL
  Filled 2020-04-17: qty 1

## 2020-04-17 NOTE — TOC Progression Note (Signed)
Transition of Care [facility]) - Progression Note    Patient Details  Name: Jasmine Davidson MRN: [id number] Date of Birth: 08-Jul-1956  [facility] CM/SW Contact  Shelbie Hutching, RN Phone Number: 04/17/2020, 3:27 PM  Clinical Narrative:     Patient agrees with home health services chooses [facility] for home health PT.  Patient will also need a rolling walker at discharge.    Expected Discharge Plan: [place] Barriers to Discharge: Continued Medical Work up  Expected Discharge Plan and Services Expected Discharge Plan: [place]   Discharge Planning Services: CM Consult Post Acute Care Choice: [place] arrangements for the past 2 months: Single Family Home                 DME Arranged: Walker rolling DME Agency: [employer] Date DME Agency Contacted: 04/17/20 Time DME Agency Contacted: [phone] Representative spoke with at DME Agency: [place]: PT [place]: [facility] Date [place]: 04/17/20 Time [place]: [phone] Representative spoke with at [place]: [place] ([place]) Interventions    Readmission Risk Interventions No flowsheet data found.

## 2020-04-17 NOTE — Progress Notes (Signed)
PROGRESS NOTE    Jasmine Davidson  ERX:[id number] DOB: 05/31/1956 DOA: 04/13/2020 PCP: Theotis Burrow, MD    Brief Narrative: Jasmine Knick Swangeris a 64 y.o.femalewith medical history significant forhypertension, type 2 diabetes mellitus, coronary artery disease, chronic kidney disease stage III,chronic foot ulcers,and BMI 39, Admitted 04/13/2020 with dizziness, malaise, nausea and almost intractable vomiting.  She was found to be in acute kidney failure with a creatinine of 9.3, increased from 1.4 in March.  Patient was noted to have potassium of 7.2 the following morning and started emergency dialysis.  5/5: Patient seen and examined.  Husband at bedside.  Stable.  Energy level still low but improving.  Feeling a little better.  Intractable vomiting has decreased.  Patient had some issues with urinary retention.  Foley catheter in place.  5/6: Patient seen and examined.  Husband at bedside.  Transfer to [place] floor.  Remains stable.  Energy level still low but improving.  No vomiting.  Tolerating p.o.  Case discussed with nephrology.  No further plans for dialysis.  Okay to discontinue Foley catheter and temporary dialysis catheter.  5/7: Patient seen and examined.  Husband at bedside.  Stable.  No BM yet.  Pending physical therapy evaluation.  Able to void spontaneously after Foley discontinuation.  Paralysis catheter discontinued yesterday.   Assessment & Plan:   Principal Problem:   Acute renal failure superimposed on stage 3 chronic kidney disease (HCC) Active Problems:   Insulin-requiring or dependent type II diabetes mellitus (HCC)   HTN (hypertension)   CAD, NATIVE VESSEL   Hyperkalemia  Acute kidney failure Patient has known baseline diabetic nephropathy with CKD 3B followed by Dr. Radene Knee at [facility]. Unclear when patient developed acute kidney failure, but symptoms have been present for 1 week.  Patient was initially thought to be an uric however bladder scan after 5 L  fluid resuscitation revealed 1800 cc urine in the bladder which was treated with in and out catheterization.  Renal ultrasound showed no hydronephrosis or stone.  UA has been sent with microscopy, complement level C3-C4 are within normal limits. Patient denies any new medications in the past couple of months, although she was diagnosed with Covid in December subsequent creatinines have been at baseline. Plan: Continue with IV fluid hydration Daily renal function Restart lisinopril at low-dose Metformin will be held on discharge Diuretics to be used only as needed post discharge  HTN Continue to hold all home antihypertensives including Lasix, lisinopril, Toprol-XL, verapamil and HCTZ   DM 2 Patient has had DM2 x30 years and has been complicated by diabetic nephropathy, retinopathy and neuropathy.  She is usually on Metformin, liraglutide, Jardiance and insulin 70/30, all of which are being held.  She is presently on sliding scale coverage with aspart and blood sugars are presently well controlled.  Patient is not eating very much.  Constipation No BM for over a week Escalate bowel regimen KUB rule out obstruction/ileus Possible enema if oral and rectal bowel regimen are ineffective   DVT prophylaxis: Subcu heparin Code Status: Full code Family Communication: Husband at bedside Disposition Plan: Status is: Inpatient  Remains inpatient appropriate because:Inpatient level of care appropriate due to severity of illness   Dispo: The patient is from: Home              Anticipated d/c is to: Home              Anticipated d/c date is: 1 day  Patient currently is not medically stable to d/c.    Creatinine improving but still not at baseline.  Pending BM.  Pending nephrology reevaluation.  Patient able to have bowel movement and nephrology okay plan on discharge within the next 24 hours.    Consultants:   Nephrology- CCK  Procedures:   Hemodialysis catheter  placement  Foley catheterization  Antimicrobials:   None   Subjective: Seen and examined Husband at bedside Improving fatigue  Objective: Vitals:   04/16/20 0746 04/16/20 1625 04/17/20 0029 04/17/20 0752  BP: (!) 142/75 (!) 142/60 (!) 170/69 (!) 168/71  Pulse:  64 65 66  Resp: 14 18  17   Temp: 98.5 F (36.9 C) 98.5 F (36.9 C) 98.2 F (36.8 C) 98.6 F (37 C)  TempSrc: Oral  Oral   SpO2: 98% 96% 99% 97%  Weight:      Height:        Intake/Output Summary (Last 24 hours) at 04/17/2020 1036 Last data filed at 04/17/2020 0856 Gross per 24 hour  Intake 2356.31 ml  Output 2020 ml  Net 336.31 ml   Filed Weights   04/12/20 2145 04/13/20 1004 04/15/20 1357  Weight: 109.8 kg 115.9 kg 119.1 kg    Examination:  General: Weak tired appearing female sitting up in bed trying to eat breakfast.  Attentive husband at bedside. Eyes: sclera anicteric, conjuctiva mild injection bilaterally CVS: S1-S2, regular  Respiratory:  decreased air entry bilaterally secondary to decreased inspiratory effort, rales at bases  GI: NABS, soft, NT  LE: No edema.  Neuro: A/O x 3, Moving all extremities equally with normal strength, CN 3-12 intact, grossly nonfocal.  Psych: patient is logical and coherent, judgement and insight appear normal, mood and affect appropriate to situation.    Data Reviewed: I have personally reviewed following labs and imaging studies  CBC: Recent Labs  Lab 04/12/20 2149 04/13/20 0616 04/14/20 0402 04/15/20 0417 04/17/20 0615  WBC 8.3 9.8 7.7 5.1 6.9  NEUTROABS  --   --   --   --  5.4  HGB 14.2 13.9 11.9* 11.0* 11.8*  HCT 43.0 43.4 34.2* 33.4* 34.8*  MCV 93.1 95.6 89.3 92.8 91.1  PLT 185 184 157 129* 258*   Basic Metabolic Panel: Recent Labs  Lab 04/13/20 1126 04/13/20 1126 04/13/20 1642 04/14/20 0402 04/15/20 0417 04/16/20 0412 04/17/20 0615  NA 137  --   --  135 138 137 137  K 6.9*   < > 4.5 4.5 3.9 3.7 3.6  CL 94*  --   --  96* 99 104 106  CO2  12*  --   --  25 28 27 23   GLUCOSE 163*  --   --  156* 164* 164* 173*  BUN 105*  --   --  79* 82* 63* 40*  CREATININE 9.43*  --   --  6.93* 5.75* 3.80* 2.42*  CALCIUM 8.9  --   --  8.0* 8.2* 8.0* 8.2*  MG  --   --   --  1.9 1.9 1.6* 1.9  PHOS 9.3*  --   --  7.6* 4.6 3.0 2.3*   < > = values in this interval not displayed.   GFR: Estimated Creatinine Clearance: 31.3 mL/min (A) (by C-G formula based on SCr of 2.42 mg/dL (H)). Liver Function Tests: No results for input(s): AST, ALT, ALKPHOS, BILITOT, PROT, ALBUMIN in the last 168 hours. No results for input(s): LIPASE, AMYLASE in the last 168 hours. No results for input(s): AMMONIA in  the last 168 hours. Coagulation Profile: No results for input(s): INR, PROTIME in the last 168 hours. Cardiac Enzymes: No results for input(s): CKTOTAL, CKMB, CKMBINDEX, TROPONINI in the last 168 hours. BNP (last 3 results) No results for input(s): PROBNP in the last 8760 hours. HbA1C: No results for input(s): HGBA1C in the last 72 hours. CBG: Recent Labs  Lab 04/16/20 0747 04/16/20 1206 04/16/20 1627 04/16/20 2139 04/17/20 0755  GLUCAP 142* 241* 253* 255* 145*   Lipid Profile: No results for input(s): CHOL, HDL, LDLCALC, TRIG, CHOLHDL, LDLDIRECT in the last 72 hours. Thyroid Function Tests: No results for input(s): TSH, T4TOTAL, FREET4, T3FREE, THYROIDAB in the last 72 hours. Anemia Panel: No results for input(s): VITAMINB12, FOLATE, FERRITIN, TIBC, IRON, RETICCTPCT in the last 72 hours. Sepsis Labs: No results for input(s): PROCALCITON, LATICACIDVEN in the last 168 hours.  Recent Results (from the past 240 hour(s))  Respiratory Panel by RT PCR (Flu A&B, Covid) - Nasopharyngeal Swab     Status: None   Collection Time: 04/13/20 12:40 AM   Specimen: Nasopharyngeal Swab  Result Value Ref Range Status   SARS Coronavirus 2 by RT PCR NEGATIVE NEGATIVE Final    Comment: (NOTE) SARS-CoV-2 target nucleic acids are NOT DETECTED. The SARS-CoV-2 RNA  is generally detectable in upper respiratoy specimens during the acute phase of infection. The lowest concentration of SARS-CoV-2 viral copies this assay can detect is 131 copies/mL. A negative result does not preclude SARS-Cov-2 infection and should not be used as the sole basis for treatment or other patient management decisions. A negative result may occur with  improper specimen collection/handling, submission of specimen other than nasopharyngeal swab, presence of viral mutation(s) within the areas targeted by this assay, and inadequate number of viral copies (<131 copies/mL). A negative result must be combined with clinical observations, patient history, and epidemiological information. The expected result is Negative. Fact Sheet for Patients:  PinkCheek.be Fact Sheet for Healthcare Providers:  GravelBags.it This test is not yet ap proved or cleared by the [place] FDA and  has been authorized for detection and/or diagnosis of SARS-CoV-2 by FDA under an Emergency Use Authorization (EUA). This EUA will remain  in effect (meaning this test can be used) for the duration of the COVID-19 declaration under Section 564(b)(1) of the Act, 21 U.S.C. section 360bbb-3(b)(1), unless the authorization is terminated or revoked sooner.    Influenza A by PCR NEGATIVE NEGATIVE Final   Influenza B by PCR NEGATIVE NEGATIVE Final    Comment: (NOTE) The Xpert Xpress SARS-CoV-2/FLU/RSV assay is intended as an aid in  the diagnosis of influenza from Nasopharyngeal swab specimens and  should not be used as a sole basis for treatment. Nasal washings and  aspirates are unacceptable for Xpert Xpress SARS-CoV-2/FLU/RSV  testing. Fact Sheet for Patients: PinkCheek.be Fact Sheet for Healthcare Providers: GravelBags.it This test is not yet approved or cleared by the [place] FDA and    has been authorized for detection and/or diagnosis of SARS-CoV-2 by  FDA under an Emergency Use Authorization (EUA). This EUA will remain  in effect (meaning this test can be used) for the duration of the  Covid-19 declaration under Section 564(b)(1) of the Act, 21  U.S.C. section 360bbb-3(b)(1), unless the authorization is  terminated or revoked. Performed at [facility], [address], [place], [place] [address]   MRSA PCR Screening     Status: None   Collection Time: 04/13/20 10:08 AM   Specimen: Nasopharyngeal  Result Value Ref Range  Status   MRSA by PCR NEGATIVE NEGATIVE Final    Comment:        The GeneXpert MRSA Assay (FDA approved for NASAL specimens only), is one component of a comprehensive MRSA colonization surveillance program. It is not intended to diagnose MRSA infection nor to guide or monitor treatment for MRSA infections. Performed at [facility], [address] [place], [place] [address]   Culture, Urine     Status: None   Collection Time: 04/13/20 11:55 PM   Specimen: Urine, Random  Result Value Ref Range Status   Specimen Description   Final    URINE, RANDOM Performed at [facility], [address] [place], [place] [address]    Special Requests   Final    NONE Performed at [facility], [address] [place],  [address]    Culture   Final    NO GROWTH Performed at [facility], [place] [address] [place],  [address]    Report Status 04/14/2020 FINAL  Final         Radiology Studies: DG Abd 1 View  Result Date: 04/17/2020 CLINICAL DATA:  Constipation. Acute renal failure. EXAM: ABDOMEN - 1 VIEW COMPARISON:  Renal ultrasound 04/13/2020 FINDINGS: Two supine views. Large amount of ascending colonic stool. Nonobstructive bowel-gas pattern. Distal gas and stool. Presumed phleboliths in the left hemipelvis. Large amount of stool projects over the left kidney. No calcific densities over  the kidneys. Vascular calcifications in both groins. IMPRESSION: 1. No acute findings. 2. Possible constipation. Electronically Signed   By: Abigail Miyamoto M.D.   On: 04/17/2020 09:34        Scheduled Meds: . aspirin EC  81 mg Oral Daily  . famotidine  20 mg Oral QHS  . heparin  5,000 Units Subcutaneous Q8H  . insulin aspart  0-6 Units Subcutaneous TID WC  . lisinopril  20 mg Oral Daily  . midodrine  5 mg Oral TID WC  . polyethylene glycol  17 g Oral Daily  . senna-docusate  1 tablet Oral BID  . sodium chloride flush  3 mL Intravenous Q12H   Continuous Infusions: . sodium chloride 100 mL/hr at 04/16/20 2123     LOS: 4 days    Time spent: 35 minutes    Sidney Ace, MD [facility] Pager [phone]  If 7PM-7AM, please contact night-coverage 04/17/2020, 10:36 AM

## 2020-04-17 NOTE — Plan of Care (Signed)
Soap suds enema given - LG BM produced.

## 2020-04-17 NOTE — Evaluation (Signed)
Physical Therapy Evaluation Patient Details Name: Jasmine Davidson MRN: [id number] DOB: 01-Feb-1956 Today's Date: 04/17/2020   History of Present Illness  Jasmine Davidson is a 89yoF comes to [facility] on 04/13/20 c giddiness, N/V malaise. Pt had elevated K+: in 7s, nephrology recommending urgent HD, with placement of groind temp HD cath via Dr. Lucky Cowboy, access later removed on 5/6. PMH: HTN, DM2, CAD, CKD3, chronic foot ulcers.  Clinical Impression  Pt admitted with above diagnosis. Pt currently with functional limitations due to the deficits listed below (see "PT Problem List"). Pt performs all mobility at supervision level or less, has acute strength and balance impairment superimposed on chronic PND unsteadiness. Pt requires a RW for household AMB at this time. Husband will be able to assist at DC. Pt will benefit from skilled PT intervention to increase independence and safety with basic mobility in preparation for discharge to the venue listed below.       Follow Up Recommendations Home health PT    Equipment Recommendations  Rolling walker with 5" wheels    Recommendations for Other Services       Precautions / Restrictions Precautions Precautions: Fall Restrictions Weight Bearing Restrictions: No      Mobility  Bed Mobility Overal bed mobility: Modified Independent                Transfers Overall transfer level: Modified independent Equipment used: None             General transfer comment: unsteady, low confidence, no LOB  Ambulation/Gait Ambulation/Gait assistance: Supervision;Modified independent (Device/Increase time) Gait Distance (Feet): 190 Feet Assistive device: Rolling walker (2 wheeled) Gait Pattern/deviations: WFL(Within Functional Limits) Gait velocity: 0.43m/s      Stairs Stairs: Yes Stairs assistance: Supervision Stair Management: One rail Left;One rail Right Number of Stairs: 4 General stair comments: labored, but safe; extra time  needed  Wheelchair Mobility    Modified Rankin (Stroke Patients Only)       Balance Overall balance assessment: Modified Independent;Mild deficits observed, not formally tested                                           Pertinent Vitals/Pain Pain Assessment: No/denies pain    Home Living Family/patient expects to be discharged to:: Private residence Living Arrangements: Spouse/significant other Available Help at Discharge: Family;Available 24 hours/day Type of Home: House Home Access: Stairs to enter Entrance Stairs-Rails: Left;Right;Can reach both   Home Layout: One level Home Equipment: Environmental consultant - 2 wheels      Prior Function Level of Independence: Independent               Hand Dominance        Extremity/Trunk Assessment   Upper Extremity Assessment Upper Extremity Assessment: Generalized weakness;Overall [facility] for tasks assessed    Lower Extremity Assessment Lower Extremity Assessment: Generalized weakness;Overall WFL for tasks assessed       Communication   Communication: No difficulties  Cognition Arousal/Alertness: Awake/alert Behavior During Therapy: WFL for tasks assessed/performed Overall Cognitive Status: Within Functional Limits for tasks assessed                                        General Comments General comments (skin integrity, edema, etc.): PND with imbalanace, much worse than baseline  Exercises     Assessment/Plan    PT Assessment Patient needs continued PT services  PT Problem List Decreased strength;Decreased activity tolerance;Decreased balance;Decreased mobility       PT Treatment Interventions DME instruction;Gait training;Stair training;Functional mobility training;Therapeutic activities;Therapeutic exercise;Balance training;Patient/family education    PT Goals (Current goals can be found in the Care Plan section)  Acute Rehab PT Goals Patient Stated Goal: regain stamina PT Goal  Formulation: With patient Time For Goal Achievement: 05/01/20 Potential to Achieve Goals: Good    Frequency Min 2X/week   Barriers to discharge Inaccessible home environment      Co-evaluation               AM-PAC PT "6 Clicks" Mobility  Outcome Measure Help needed turning from your back to your side while in a flat bed without using bedrails?: A Little Help needed moving from lying on your back to sitting on the side of a flat bed without using bedrails?: A Little Help needed moving to and from a bed to a chair (including a wheelchair)?: A Little Help needed standing up from a chair using your arms (e.g., wheelchair or bedside chair)?: A Little Help needed to walk in hospital room?: A Little Help needed climbing 3-5 steps with a railing? : A Little 6 Click Score: 18    End of Session Equipment Utilized During Treatment: Gait belt Activity Tolerance: Patient tolerated treatment well;No increased pain Patient left: in chair;with family/visitor present;with call bell/phone within reach Nurse Communication: Mobility status PT Visit Diagnosis: Unsteadiness on feet (R26.81);Other abnormalities of gait and mobility (R26.89);Muscle weakness (generalized) (M62.81)    Time: 4098-1191 PT Time Calculation (min) (ACUTE ONLY): 32 min   Charges:   PT Evaluation $PT Eval Moderate Complexity: 1 Mod PT Treatments $Gait Training: 8-22 mins       11:52 AM, 04/17/20 Etta Grandchild, PT, DPT Physical Therapist - [facility]  [phone] ([place])    [place] C 04/17/2020, 11:51 AM

## 2020-04-17 NOTE — Progress Notes (Signed)
OT Cancellation Note  Patient Details Name: Jasmine Davidson MRN: [id number] DOB: 1956-11-29   Cancelled Treatment:    Reason Eval/Treat Not Completed: Patient at procedure or test/ unavailable  Pt's RN and CNA preparing to administer "soap-sud" enema at this time. Will f/u for OT Eval at later date/time as able/pt becomes available.   Gerrianne Scale, [place], OTR/L ascom [phone] 04/17/20, 3:14 PM

## 2020-04-18 DIAGNOSIS — E119 Type 2 diabetes mellitus without complications: Secondary | ICD-10-CM

## 2020-04-18 DIAGNOSIS — I2510 Atherosclerotic heart disease of native coronary artery without angina pectoris: Secondary | ICD-10-CM

## 2020-04-18 DIAGNOSIS — I10 Essential (primary) hypertension: Secondary | ICD-10-CM

## 2020-04-18 DIAGNOSIS — Z794 Long term (current) use of insulin: Secondary | ICD-10-CM

## 2020-04-18 LAB — BASIC METABOLIC PANEL
Anion gap: 8 (ref 5–15)
BUN: 29 mg/dL — ABNORMAL HIGH (ref 8–23)
CO2: 22 mmol/L (ref 22–32)
Calcium: 8.2 mg/dL — ABNORMAL LOW (ref 8.9–10.3)
Chloride: 107 mmol/L (ref 98–111)
Creatinine, Ser: 1.62 mg/dL — ABNORMAL HIGH (ref 0.44–1.00)
GFR calc Af Amer: 39 mL/min — ABNORMAL LOW (ref >60)
GFR calc non Af Amer: 33 mL/min — ABNORMAL LOW (ref >60)
Glucose, Bld: 119 mg/dL — ABNORMAL HIGH (ref 70–99)
Potassium: 3.7 mmol/L (ref 3.5–5.1)
Sodium: 137 mmol/L (ref 135–145)

## 2020-04-18 LAB — CBC WITH DIFFERENTIAL/PLATELET
Abs Immature Granulocytes: 0.02 10*3/uL (ref 0.00–0.07)
Basophils Absolute: 0 10*3/uL (ref 0.0–0.1)
Basophils Relative: 0 %
Eosinophils Absolute: 0.2 10*3/uL (ref 0.0–0.5)
Eosinophils Relative: 2 %
HCT: 31.4 % — ABNORMAL LOW (ref 36.0–46.0)
Hemoglobin: 10.5 g/dL — ABNORMAL LOW (ref 12.0–15.0)
Immature Granulocytes: 0 %
Lymphocytes Relative: 14 %
Lymphs Abs: 0.9 10*3/uL (ref 0.7–4.0)
MCH: 30.8 pg (ref 26.0–34.0)
MCHC: 33.4 g/dL (ref 30.0–36.0)
MCV: 92.1 fL (ref 80.0–100.0)
Monocytes Absolute: 0.3 10*3/uL (ref 0.1–1.0)
Monocytes Relative: 5 %
Neutro Abs: 5.1 10*3/uL (ref 1.7–7.7)
Neutrophils Relative %: 79 %
Platelets: 132 10*3/uL — ABNORMAL LOW (ref 150–400)
RBC: 3.41 MIL/uL — ABNORMAL LOW (ref 3.87–5.11)
RDW: 13.1 % (ref 11.5–15.5)
WBC: 6.6 10*3/uL (ref 4.0–10.5)
nRBC: 0 % (ref 0.0–0.2)

## 2020-04-18 LAB — MAGNESIUM: Magnesium: 1.7 mg/dL (ref 1.7–2.4)

## 2020-04-18 LAB — GLUCOSE, CAPILLARY: Glucose-Capillary: 107 mg/dL — ABNORMAL HIGH (ref 70–99)

## 2020-04-18 LAB — PHOSPHORUS: Phosphorus: 2.6 mg/dL (ref 2.5–4.6)

## 2020-04-18 MED ORDER — LISINOPRIL 20 MG PO TABS
20.0000 mg | ORAL_TABLET | Freq: Every day | ORAL | Status: DC
  Administered 2020-04-18: 20 mg via ORAL
  Filled 2020-04-18: qty 1

## 2020-04-18 MED ORDER — LISINOPRIL 10 MG PO TABS: 10.0000 mg | ORAL_TABLET | Freq: Every day | ORAL | Status: DC

## 2020-04-18 MED ORDER — METOPROLOL SUCCINATE ER 50 MG PO TB24
50.0000 mg | ORAL_TABLET | Freq: Every day | ORAL | Status: DC
  Administered 2020-04-18: 50 mg via ORAL
  Filled 2020-04-18: qty 1

## 2020-04-18 MED ORDER — LISINOPRIL 20 MG PO TABS: 20.0000 mg | ORAL_TABLET | Freq: Every day | ORAL | 0 refills | Status: DC

## 2020-04-18 NOTE — Discharge Instructions (Signed)

## 2020-04-18 NOTE — Discharge Summary (Signed)
Physician Discharge Summary  Jasmine Davidson ION:[id number] DOB: August 30, 1956 DOA: 04/13/2020  PCP: Jasmine Burrow, MD  Admit date: 04/13/2020 Discharge date: 04/18/2020  Admitted From: Home Disposition: Home  Recommendations for Outpatient Follow-up:  1. Follow up with PCP in 1-2 weeks 2. Follow-up with nephrology if indicated  Home Health: Yes Equipment/Devices: No  Discharge Condition: Stable CODE STATUS: Full Diet recommendation: Heart Healthy / Carb Modified  Brief/Interim Summary: Jasmine Cedeno Swangeris a 64 y.o.femalewith medical history significant forhypertension, type 2 diabetes mellitus, coronary artery disease, chronic kidney disease stage III,chronic foot ulcers,and BMI 39, Admitted 04/13/2020 with dizziness,malaise,nausea and almost intractable vomiting.She was found to be in acute kidney failure with a creatinine of 9.3,increased from 1.4in March. Patient was noted to have potassium of 7.2 the following morning and started emergency dialysis.  5/5: Patient seen and examined.  Husband at bedside.  Stable.  Energy level still low but improving.  Feeling a little better.  Intractable vomiting has decreased.  Patient had some issues with urinary retention.  Foley catheter in place.  5/6: Patient seen and examined.  Husband at bedside.  Transfer to [place] floor.  Remains stable.  Energy level still low but improving.  No vomiting.  Tolerating p.o.  Case discussed with nephrology.  No further plans for dialysis.  Okay to discontinue Foley catheter and temporary dialysis catheter.  5/7: Patient seen and examined.  Husband at bedside.  Case discussed with nephrology.  No further recommendations from them.  Creatinine improved to 1.62.  Stable for discharge.  Patient was able to void spontaneously and had a large BM.  Lengthy discussion with patient and husband at bedside.  Recommend she hold hydrochlorothiazide and Metformin at discharge.  Also recommend she hold  verapamil.  Restart lisinopril at 20 mg a day and continue previous home dose Toprol-XL at 50 mg a day.  I discussed medication changes with the patient and husband at bedside at length.  All questions were answered.  I recommended that she check her blood pressure twice a day at home after discharge and bring the log of her blood pressure measurements to next primary care office visit.  Discharge Diagnoses:  Principal Problem:   Acute renal failure superimposed on stage 3 chronic kidney disease (HCC) Active Problems:   Insulin-requiring or dependent type II diabetes mellitus ([place])   HTN (hypertension)   CAD, NATIVE VESSEL   Hyperkalemia  Acute kidney failure Patient has known baseline diabetic nephropathy with CKD 3B followed by Dr. Radene Davidson at [facility]. Unclear when patient developed acute kidney failure, butsymptoms have been present for 1 week.  Patient was initially thought to be an uric however bladder scan after 5 L fluid resuscitation revealed 1800 cc urine in the bladder which was treated with in and out catheterization.  Renal ultrasound showed no hydronephrosis or stone.  UA has been sent with microscopy,complement level C3-C4 are within normal limits. Patient denies any new medications in the past couple of months,although she was diagnosed with Covid in December subsequent creatinines have been at baseline. Discharge recommendations: Ensure adequate hydration at home Hold hydrochlorothiazide Hold Metformin Hold verapamil Decrease home lisinopril dose to 10 mg daily Continue home metoprolol See primary care within 5 to 7 days of discharge for repeat lab work Discussed restarting home blood pressure medications and home Metformin at the time Diuretics will be used as needed at time of discharge.  Patient instructed to follow-up with primary care physician for further discussion regarding need for this medication  HTN See above   DM 2 See above.  Remainder diabetic  medications unchanged.  Metformin on hold.  Discharge Instructions  Discharge Instructions    Diet - low sodium heart healthy   Complete by: As directed    Increase activity slowly   Complete by: As directed      Allergies as of 04/18/2020      Reactions   Pregabalin       Medication List    STOP taking these medications   furosemide 40 MG tablet Commonly known as: LASIX   hydrochlorothiazide 25 MG tablet Commonly known as: HYDRODIURIL   metFORMIN 1000 MG tablet Commonly known as: GLUCOPHAGE   verapamil 120 MG CR tablet Commonly known as: CALAN-SR     TAKE these medications   Aspirin 81 81 MG EC tablet Generic drug: aspirin Take 81 mg by mouth daily.   atorvastatin 40 MG tablet Commonly known as: LIPITOR TAKE ONE TABLET BY MOUTH EVERY DAY What changed:   how much to take  how to take this  when to take this  additional instructions   gabapentin 300 MG capsule Commonly known as: NEURONTIN Take 300 mg by mouth 3 (three) times daily.   HumuLIN 70/30 (70-30) 100 UNIT/ML injection Generic drug: insulin NPH-regular Human Inject 15-20 Units into the skin See admin instructions. Inject 20u under the skin every morning and inject 15u under the skin every night   Jardiance 10 MG Tabs tablet Generic drug: empagliflozin Take 10 mg by mouth daily.   liraglutide 18 MG/3ML Sopn Commonly known as: VICTOZA Inject 0.6 mg into the skin daily.   lisinopril 20 MG tablet Commonly known as: ZESTRIL Take 1 tablet (20 mg total) by mouth daily. What changed:   medication strength  how much to take   metoprolol succinate 25 MG 24 hr tablet Commonly known as: TOPROL-XL Take 50 mg by mouth daily.   Vitamin D (Ergocalciferol) 1.25 MG (50000 UNIT) Caps capsule Commonly known as: DRISDOL Take 50,000 Units by mouth once a week.            Durable Medical Equipment  (From admission, onward)         Start     Ordered   04/18/20 0714  For home use only DME  Walker rolling  Once    Question Answer Comment  Walker: With 5 Inch Wheels   Patient needs a walker to treat with the following condition Weakness      04/18/20 0714   04/17/20 1043  For home use only DME Walker rolling  Once    Question Answer Comment  Walker: With 5 Inch Wheels   Patient needs a walker to treat with the following condition Weakness      04/17/20 1043          Allergies  Allergen Reactions  . Pregabalin     Consultations:  Nephrology-[facility]   Procedures/Studies: DG Abd 1 View  Result Date: 04/17/2020 CLINICAL DATA:  Constipation. Acute renal failure. EXAM: ABDOMEN - 1 VIEW COMPARISON:  Renal ultrasound 04/13/2020 FINDINGS: Two supine views. Large amount of ascending colonic stool. Nonobstructive bowel-gas pattern. Distal gas and stool. Presumed phleboliths in the left hemipelvis. Large amount of stool projects over the left kidney. No calcific densities over the kidneys. Vascular calcifications in both groins. IMPRESSION: 1. No acute findings. 2. Possible constipation. Electronically Signed   By: Abigail Miyamoto M.D.   On: 04/17/2020 09:34   US Renal  Result Date: 04/13/2020 CLINICAL DATA:  64 year old female with renal failure. EXAM: RENAL / URINARY TRACT ULTRASOUND COMPLETE COMPARISON:  Renal ultrasound dated 08/08/2017. FINDINGS: Right Kidney: Renal measurements: 10.6 x 5.6 x 5.8 cm = volume: 180 mL. Mild parenchyma atrophy. Normal echogenicity. No hydronephrosis or shadowing stone. Left Kidney: Renal measurements: 11.6 x 5.9 x 5.6 cm = volume: 198 mL. Mild parenchyma atrophy and cortical thinning. Normal echogenicity. No hydronephrosis or shadowing stone. Bladder: The urinary bladder is not well visualized. Other: None. IMPRESSION: No hydronephrosis or shadowing stone. Electronically Signed   By: Anner Crete M.D.   On: 04/13/2020 02:43   US Carotid Bilateral  Result Date: 03/31/2020 CLINICAL DATA:  Dizziness, hypertension, hyperlipidemia and diabetes EXAM:  BILATERAL CAROTID DUPLEX ULTRASOUND TECHNIQUE: Pearline Cables scale imaging, color Doppler and duplex ultrasound were performed of bilateral carotid and vertebral arteries in the neck. COMPARISON:  None. FINDINGS: Criteria: Quantification of carotid stenosis is based on velocity parameters that correlate the residual internal carotid diameter with NASCET-based stenosis levels, using the diameter of the distal internal carotid lumen as the denominator for stenosis measurement. The following velocity measurements were obtained: RIGHT ICA: 119/35 cm/sec CCA: 20/25 cm/sec SYSTOLIC ICA/CCA RATIO:  1.3 ECA: 223 cm/sec LEFT ICA: 85/20 cm/sec CCA: 427/06 cm/sec SYSTOLIC ICA/CCA RATIO:  0.6 ECA: 104 cm/sec RIGHT CAROTID ARTERY: Moderate echogenic shadowing plaque formation. No hemodynamically significant right ICA stenosis, velocity elevation, or turbulent flow. Degree of narrowing less than 50%. RIGHT VERTEBRAL ARTERY:  Antegrade LEFT CAROTID ARTERY: Similar scattered mild echogenic plaque formation. No hemodynamically significant left ICA stenosis, velocity elevation, or turbulent flow. LEFT VERTEBRAL ARTERY:  Antegrade IMPRESSION: Bilateral carotid atherosclerosis. No hemodynamically significant ICA stenosis. Degree of narrowing less than 50% bilaterally by ultrasound criteria. Patent antegrade vertebral flow bilaterally Electronically Signed   By: Jerilynn Mages.  Shick M.D.   On: 03/31/2020 15:36    (Echo, Carotid, EGD, Colonoscopy, ERCP)    Subjective: Seen and examined the day of discharge Tired but otherwise asymptomatic Stable for discharge home  Discharge Exam: Vitals:   04/18/20 0033 04/18/20 0739  BP: (!) 158/68 (!) 154/65  Pulse: 66 68  Resp: 16 18  Temp: 98.6 F (37 C) 98.2 F (36.8 C)  SpO2: 98% 98%   Vitals:   04/17/20 0752 04/17/20 1654 04/18/20 0033 04/18/20 0739  BP: (!) 168/71 (!) 158/67 (!) 158/68 (!) 154/65  Pulse: 66 69 66 68  Resp: 17 18 16 18   Temp: 98.6 F (37 C) 98.3 F (36.8 C) 98.6 F (37  C) 98.2 F (36.8 C)  TempSrc:   Oral Oral  SpO2: 97% 99% 98% 98%  Weight:      Height:        General: Pt is alert, awake, not in acute distress Cardiovascular: RRR, S1/S2 +, no rubs, no gallops Respiratory: CTA bilaterally, no wheezing, no rhonchi Abdominal: Soft, NT, ND, bowel sounds + Extremities: no edema, no cyanosis    The results of significant diagnostics from this hospitalization (including imaging, microbiology, ancillary and laboratory) are listed below for reference.     Microbiology: Recent Results (from the past 240 hour(s))  Respiratory Panel by RT PCR (Flu A&B, Covid) - Nasopharyngeal Swab     Status: None   Collection Time: 04/13/20 12:40 AM   Specimen: Nasopharyngeal Swab  Result Value Ref Range Status   SARS Coronavirus 2 by RT PCR NEGATIVE NEGATIVE Final    Comment: (NOTE) SARS-CoV-2 target nucleic acids are NOT DETECTED. The SARS-CoV-2 RNA is generally detectable in upper respiratoy specimens during the acute  phase of infection. The lowest concentration of SARS-CoV-2 viral copies this assay can detect is 131 copies/mL. A negative result does not preclude SARS-Cov-2 infection and should not be used as the sole basis for treatment or other patient management decisions. A negative result may occur with  improper specimen collection/handling, submission of specimen other than nasopharyngeal swab, presence of viral mutation(s) within the areas targeted by this assay, and inadequate number of viral copies (<131 copies/mL). A negative result must be combined with clinical observations, patient history, and epidemiological information. The expected result is Negative. Fact Sheet for Patients:  PinkCheek.be Fact Sheet for Healthcare Providers:  GravelBags.it This test is not yet ap proved or cleared by the [place] FDA and  has been authorized for detection and/or diagnosis of SARS-CoV-2 by FDA  under an Emergency Use Authorization (EUA). This EUA will remain  in effect (meaning this test can be used) for the duration of the COVID-19 declaration under Section 564(b)(1) of the Act, 21 U.S.C. section 360bbb-3(b)(1), unless the authorization is terminated or revoked sooner.    Influenza A by PCR NEGATIVE NEGATIVE Final   Influenza B by PCR NEGATIVE NEGATIVE Final    Comment: (NOTE) The Xpert Xpress SARS-CoV-2/FLU/RSV assay is intended as an aid in  the diagnosis of influenza from Nasopharyngeal swab specimens and  should not be used as a sole basis for treatment. Nasal washings and  aspirates are unacceptable for Xpert Xpress SARS-CoV-2/FLU/RSV  testing. Fact Sheet for Patients: PinkCheek.be Fact Sheet for Healthcare Providers: GravelBags.it This test is not yet approved or cleared by the [place] FDA and  has been authorized for detection and/or diagnosis of SARS-CoV-2 by  FDA under an Emergency Use Authorization (EUA). This EUA will remain  in effect (meaning this test can be used) for the duration of the  Covid-19 declaration under Section 564(b)(1) of the Act, 21  U.S.C. section 360bbb-3(b)(1), unless the authorization is  terminated or revoked. Performed at [facility], [address] [place], [place] [address]   MRSA PCR Screening     Status: None   Collection Time: 04/13/20 10:08 AM   Specimen: Nasopharyngeal  Result Value Ref Range Status   MRSA by PCR NEGATIVE NEGATIVE Final    Comment:        The GeneXpert MRSA Assay (FDA approved for NASAL specimens only), is one component of a comprehensive MRSA colonization surveillance program. It is not intended to diagnose MRSA infection nor to guide or monitor treatment for MRSA infections. Performed at [facility], [address] [place], [place] [address]   Culture, Urine     Status: None   Collection Time: 04/13/20 11:55 PM    Specimen: Urine, Random  Result Value Ref Range Status   Specimen Description   Final    URINE, RANDOM Performed at [facility], [address] [place], [place] [address]    Special Requests   Final    NONE Performed at [facility], [address], [place], [place] [address]    Culture   Final    NO GROWTH Performed at [facility], [place] [address] [place], [place] [address]    Report Status 04/14/2020 FINAL  Final     Labs: BNP (last 3 results) No results for input(s): BNP in the last 8760 hours. Basic Metabolic Panel: Recent Labs  Lab 04/14/20 0402 04/15/20 0417 04/16/20 0412 04/17/20 0615 04/18/20 0448  NA 135 138 137 137 137  K 4.5 3.9  3.7 3.6 3.7  CL 96* 99 104 106 107  CO2 25 28 27 23 22   GLUCOSE 156* 164* 164* 173* 119*  BUN 79* 82* 63* 40* 29*  CREATININE 6.93* 5.75* 3.80* 2.42* 1.62*  CALCIUM 8.0* 8.2* 8.0* 8.2* 8.2*  MG 1.9 1.9 1.6* 1.9 1.7  PHOS 7.6* 4.6 3.0 2.3* 2.6   Liver Function Tests: No results for input(s): AST, ALT, ALKPHOS, BILITOT, PROT, ALBUMIN in the last 168 hours. No results for input(s): LIPASE, AMYLASE in the last 168 hours. No results for input(s): AMMONIA in the last 168 hours. CBC: Recent Labs  Lab 04/13/20 0616 04/14/20 0402 04/15/20 0417 04/17/20 0615 04/18/20 0448  WBC 9.8 7.7 5.1 6.9 6.6  NEUTROABS  --   --   --  5.4 5.1  HGB 13.9 11.9* 11.0* 11.8* 10.5*  HCT 43.4 34.2* 33.4* 34.8* 31.4*  MCV 95.6 89.3 92.8 91.1 92.1  PLT 184 157 129* 117* 132*   Cardiac Enzymes: No results for input(s): CKTOTAL, CKMB, CKMBINDEX, TROPONINI in the last 168 hours. BNP: Invalid input(s): POCBNP CBG: Recent Labs  Lab 04/17/20 0755 04/17/20 1134 04/17/20 1649 04/17/20 2215 04/18/20 0737  GLUCAP 145* 192* 172* 124* 107*   D-Dimer No results for input(s): DDIMER in the last 72 hours. Hgb A1c No results for input(s): HGBA1C in the last 72 hours. Lipid Profile No results for input(s): CHOL, HDL,  LDLCALC, TRIG, CHOLHDL, LDLDIRECT in the last 72 hours. Thyroid function studies No results for input(s): TSH, T4TOTAL, T3FREE, THYROIDAB in the last 72 hours.  Invalid input(s): FREET3 Anemia work up No results for input(s): VITAMINB12, FOLATE, FERRITIN, TIBC, IRON, RETICCTPCT in the last 72 hours. Urinalysis    Component Value Date/Time   COLORURINE YELLOW (A) 04/13/2020 2355   APPEARANCEUR HAZY (A) 04/13/2020 2355   APPEARANCEUR Clear 05/31/2014 1920   LABSPEC 1.006 04/13/2020 2355   LABSPEC 1.004 05/31/2014 1920   PHURINE 5.0 04/13/2020 2355   GLUCOSEU >=500 (A) 04/13/2020 2355   GLUCOSEU 50 mg/dL 05/31/2014 1920   HGBUR SMALL (A) 04/13/2020 2355   BILIRUBINUR NEGATIVE 04/13/2020 2355   BILIRUBINUR Negative 05/31/2014 1920   KETONESUR 5 (A) 04/13/2020 2355   PROTEINUR 30 (A) 04/13/2020 2355   NITRITE NEGATIVE 04/13/2020 2355   LEUKOCYTESUR NEGATIVE 04/13/2020 2355   LEUKOCYTESUR Negative 05/31/2014 1920   Sepsis Labs Invalid input(s): PROCALCITONIN,  WBC,  LACTICIDVEN Microbiology Recent Results (from the past 240 hour(s))  Respiratory Panel by RT PCR (Flu A&B, Covid) - Nasopharyngeal Swab     Status: None   Collection Time: 04/13/20 12:40 AM   Specimen: Nasopharyngeal Swab  Result Value Ref Range Status   SARS Coronavirus 2 by RT PCR NEGATIVE NEGATIVE Final    Comment: (NOTE) SARS-CoV-2 target nucleic acids are NOT DETECTED. The SARS-CoV-2 RNA is generally detectable in upper respiratoy specimens during the acute phase of infection. The lowest concentration of SARS-CoV-2 viral copies this assay can detect is 131 copies/mL. A negative result does not preclude SARS-Cov-2 infection and should not be used as the sole basis for treatment or other patient management decisions. A negative result may occur with  improper specimen collection/handling, submission of specimen other than nasopharyngeal swab, presence of viral mutation(s) within the areas targeted by this assay,  and inadequate number of viral copies (<131 copies/mL). A negative result must be combined with clinical observations, patient history, and epidemiological information. The expected result is Negative. Fact Sheet for Patients:  PinkCheek.be Fact Sheet for Healthcare Providers:  GravelBags.it This test  is not yet ap proved or cleared by the [place] and  has been authorized for detection and/or diagnosis of SARS-CoV-2 by FDA under an Emergency Use Authorization (EUA). This EUA will remain  in effect (meaning this test can be used) for the duration of the COVID-19 declaration under Section 564(b)(1) of the Act, 21 U.S.C. section 360bbb-3(b)(1), unless the authorization is terminated or revoked sooner.    Influenza A by PCR NEGATIVE NEGATIVE Final   Influenza B by PCR NEGATIVE NEGATIVE Final    Comment: (NOTE) The Xpert Xpress SARS-CoV-2/FLU/RSV assay is intended as an aid in  the diagnosis of influenza from Nasopharyngeal swab specimens and  should not be used as a sole basis for treatment. Nasal washings and  aspirates are unacceptable for Xpert Xpress SARS-CoV-2/FLU/RSV  testing. Fact Sheet for Patients: PinkCheek.be Fact Sheet for Healthcare Providers: GravelBags.it This test is not yet approved or cleared by the [place] FDA and  has been authorized for detection and/or diagnosis of SARS-CoV-2 by  FDA under an Emergency Use Authorization (EUA). This EUA will remain  in effect (meaning this test can be used) for the duration of the  Covid-19 declaration under Section 564(b)(1) of the Act, 21  U.S.C. section 360bbb-3(b)(1), unless the authorization is  terminated or revoked. Performed at [facility], [address] [place], [place] [address]   MRSA PCR Screening     Status: None   Collection Time: 04/13/20 10:08 AM   Specimen:  Nasopharyngeal  Result Value Ref Range Status   MRSA by PCR NEGATIVE NEGATIVE Final    Comment:        The GeneXpert MRSA Assay (FDA approved for NASAL specimens only), is one component of a comprehensive MRSA colonization surveillance program. It is not intended to diagnose MRSA infection nor to guide or monitor treatment for MRSA infections. Performed at [facility], [address] [place], [place] [address]   Culture, Urine     Status: None   Collection Time: 04/13/20 11:55 PM   Specimen: Urine, Random  Result Value Ref Range Status   Specimen Description   Final    URINE, RANDOM Performed at [facility], [address] [place], [place] [address]    Special Requests   Final    NONE Performed at [facility], [address], [place], [place] [address]    Culture   Final    NO GROWTH Performed at [facility], [place] [address], [place], [place] [address]    Report Status 04/14/2020 FINAL  Final     Time coordinating discharge: Over 30 minutes  SIGNED:   Sidney Ace, MD  [facility] 04/18/2020, 10:46 AM Pager   If 7PM-7AM, please contact night-coverage

## 2020-04-18 NOTE — Progress Notes (Signed)
OT Cancellation Note  Patient Details Name: Jasmine Davidson MRN: [id number] DOB: December 04, 1956   Cancelled Treatment:    Reason Eval/Treat Not Completed: OT screened, no needs identified, will sign off. OT order received and chart reviewed. Pt and RN at bed side endorse pt has no identified skilled acute OT needs. Will sign off - please re-consult if needs arise.   Dessie Coma, M.S. OTR/L  04/18/20, 9:26 AM

## 2020-04-19 LAB — KAPPA/LAMBDA LIGHT CHAINS
Kappa free light chain: UNDETERMINED mg/L
Kappa, lambda light chain ratio: UNDETERMINED
Lambda free light chains: UNDETERMINED mg/L

## 2020-07-18 ENCOUNTER — Emergency Department: Payer: Medicare HMO

## 2020-07-18 ENCOUNTER — Inpatient Hospital Stay
Admission: EM | Admit: 2020-07-18 | Discharge: 2020-07-21 | DRG: 282 | Disposition: A | Discharge status: Home / Self Care | Payer: Medicare HMO | Attending: Student | Admitting: Student

## 2020-07-18 ENCOUNTER — Other Ambulatory Visit: Payer: Self-pay

## 2020-07-18 DIAGNOSIS — I214 Non-ST elevation (NSTEMI) myocardial infarction: Principal | ICD-10-CM | POA: Diagnosis present

## 2020-07-18 DIAGNOSIS — E1140 Type 2 diabetes mellitus with diabetic neuropathy, unspecified: Secondary | ICD-10-CM | POA: Diagnosis present

## 2020-07-18 DIAGNOSIS — E782 Mixed hyperlipidemia: Secondary | ICD-10-CM | POA: Diagnosis present

## 2020-07-18 DIAGNOSIS — Z794 Long term (current) use of insulin: Secondary | ICD-10-CM

## 2020-07-18 DIAGNOSIS — E11319 Type 2 diabetes mellitus with unspecified diabetic retinopathy without macular edema: Secondary | ICD-10-CM | POA: Diagnosis present

## 2020-07-18 DIAGNOSIS — E1169 Type 2 diabetes mellitus with other specified complication: Secondary | ICD-10-CM | POA: Diagnosis present

## 2020-07-18 DIAGNOSIS — E1159 Type 2 diabetes mellitus with other circulatory complications: Secondary | ICD-10-CM | POA: Diagnosis present

## 2020-07-18 DIAGNOSIS — R55 Syncope and collapse: Secondary | ICD-10-CM

## 2020-07-18 DIAGNOSIS — E1165 Type 2 diabetes mellitus with hyperglycemia: Secondary | ICD-10-CM | POA: Diagnosis present

## 2020-07-18 DIAGNOSIS — R778 Other specified abnormalities of plasma proteins: Secondary | ICD-10-CM

## 2020-07-18 DIAGNOSIS — Z833 Family history of diabetes mellitus: Secondary | ICD-10-CM

## 2020-07-18 DIAGNOSIS — Z20822 Contact with and (suspected) exposure to covid-19: Secondary | ICD-10-CM | POA: Diagnosis present

## 2020-07-18 DIAGNOSIS — Z888 Allergy status to other drugs, medicaments and biological substances status: Secondary | ICD-10-CM

## 2020-07-18 DIAGNOSIS — Z7982 Long term (current) use of aspirin: Secondary | ICD-10-CM

## 2020-07-18 DIAGNOSIS — E119 Type 2 diabetes mellitus without complications: Secondary | ICD-10-CM

## 2020-07-18 DIAGNOSIS — E785 Hyperlipidemia, unspecified: Secondary | ICD-10-CM

## 2020-07-18 DIAGNOSIS — I2510 Atherosclerotic heart disease of native coronary artery without angina pectoris: Secondary | ICD-10-CM | POA: Diagnosis present

## 2020-07-18 DIAGNOSIS — Z79899 Other long term (current) drug therapy: Secondary | ICD-10-CM

## 2020-07-18 DIAGNOSIS — I2584 Coronary atherosclerosis due to calcified coronary lesion: Secondary | ICD-10-CM | POA: Diagnosis present

## 2020-07-18 DIAGNOSIS — E1122 Type 2 diabetes mellitus with diabetic chronic kidney disease: Secondary | ICD-10-CM | POA: Diagnosis present

## 2020-07-18 DIAGNOSIS — I872 Venous insufficiency (chronic) (peripheral): Secondary | ICD-10-CM | POA: Diagnosis present

## 2020-07-18 DIAGNOSIS — N1831 Chronic kidney disease, stage 3a: Secondary | ICD-10-CM | POA: Diagnosis present

## 2020-07-18 DIAGNOSIS — R0683 Snoring: Secondary | ICD-10-CM | POA: Diagnosis present

## 2020-07-18 DIAGNOSIS — N183 Chronic kidney disease, stage 3 unspecified: Secondary | ICD-10-CM

## 2020-07-18 DIAGNOSIS — I951 Orthostatic hypotension: Secondary | ICD-10-CM | POA: Diagnosis present

## 2020-07-18 DIAGNOSIS — Z8249 Family history of ischemic heart disease and other diseases of the circulatory system: Secondary | ICD-10-CM

## 2020-07-18 DIAGNOSIS — I129 Hypertensive chronic kidney disease with stage 1 through stage 4 chronic kidney disease, or unspecified chronic kidney disease: Secondary | ICD-10-CM | POA: Diagnosis present

## 2020-07-18 DIAGNOSIS — Z7901 Long term (current) use of anticoagulants: Secondary | ICD-10-CM

## 2020-07-18 DIAGNOSIS — Z8616 Personal history of COVID-19: Secondary | ICD-10-CM

## 2020-07-18 DIAGNOSIS — I152 Hypertension secondary to endocrine disorders: Secondary | ICD-10-CM | POA: Diagnosis present

## 2020-07-18 HISTORY — DX: Chronic kidney disease, stage 3 unspecified: N18.30

## 2020-07-18 LAB — CBC WITH DIFFERENTIAL/PLATELET
Abs Immature Granulocytes: 0.03 10*3/uL (ref 0.00–0.07)
Basophils Absolute: 0 10*3/uL (ref 0.0–0.1)
Basophils Relative: 1 %
Eosinophils Absolute: 0.1 10*3/uL (ref 0.0–0.5)
Eosinophils Relative: 1 %
HCT: 39.2 % (ref 36.0–46.0)
Hemoglobin: 13 g/dL (ref 12.0–15.0)
Immature Granulocytes: 1 %
Lymphocytes Relative: 15 %
Lymphs Abs: 0.9 10*3/uL (ref 0.7–4.0)
MCH: 31 pg (ref 26.0–34.0)
MCHC: 33.2 g/dL (ref 30.0–36.0)
MCV: 93.6 fL (ref 80.0–100.0)
Monocytes Absolute: 0.4 10*3/uL (ref 0.1–1.0)
Monocytes Relative: 6 %
Neutro Abs: 4.8 10*3/uL (ref 1.7–7.7)
Neutrophils Relative %: 76 %
Platelets: 149 10*3/uL — ABNORMAL LOW (ref 150–400)
RBC: 4.19 MIL/uL (ref 3.87–5.11)
RDW: 12.4 % (ref 11.5–15.5)
WBC: 6.2 10*3/uL (ref 4.0–10.5)
nRBC: 0 % (ref 0.0–0.2)

## 2020-07-18 LAB — COMPREHENSIVE METABOLIC PANEL
ALT: 13 U/L (ref 0–44)
AST: 15 U/L (ref 15–41)
Albumin: 3.7 g/dL (ref 3.5–5.0)
Alkaline Phosphatase: 72 U/L (ref 38–126)
Anion gap: 10 (ref 5–15)
BUN: 28 mg/dL — ABNORMAL HIGH (ref 8–23)
CO2: 20 mmol/L — ABNORMAL LOW (ref 22–32)
Calcium: 8.3 mg/dL — ABNORMAL LOW (ref 8.9–10.3)
Chloride: 106 mmol/L (ref 98–111)
Creatinine, Ser: 1.3 mg/dL — ABNORMAL HIGH (ref 0.44–1.00)
GFR calc Af Amer: 51 mL/min — ABNORMAL LOW (ref >60)
GFR calc non Af Amer: 44 mL/min — ABNORMAL LOW (ref >60)
Glucose, Bld: 164 mg/dL — ABNORMAL HIGH (ref 70–99)
Potassium: 4.3 mmol/L (ref 3.5–5.1)
Sodium: 136 mmol/L (ref 135–145)
Total Bilirubin: 1 mg/dL (ref 0.3–1.2)
Total Protein: 6.2 g/dL — ABNORMAL LOW (ref 6.5–8.1)

## 2020-07-18 LAB — URINALYSIS, COMPLETE (UACMP) WITH MICROSCOPIC
Bacteria, UA: NONE SEEN
Bilirubin Urine: NEGATIVE
Glucose, UA: >=500 mg/dL — AB
Hgb urine dipstick: NEGATIVE
Ketones, ur: 20 mg/dL — AB
Nitrite: NEGATIVE
Protein, ur: NEGATIVE mg/dL
Specific Gravity, Urine: 1.013 (ref 1.005–1.030)
pH: 7 (ref 5.0–8.0)

## 2020-07-18 LAB — MAGNESIUM: Magnesium: 1.8 mg/dL (ref 1.7–2.4)

## 2020-07-18 LAB — TROPONIN I (HIGH SENSITIVITY)
Troponin I (High Sensitivity): 23 ng/L — ABNORMAL HIGH (ref <18)
Troponin I (High Sensitivity): 68 ng/L — ABNORMAL HIGH (ref <18)
Troponin I (High Sensitivity): 70 ng/L — ABNORMAL HIGH (ref <18)
Troponin I (High Sensitivity): 53 ng/L — ABNORMAL HIGH (ref <18)

## 2020-07-18 LAB — GLUCOSE, CAPILLARY: Glucose-Capillary: 179 mg/dL — ABNORMAL HIGH (ref 70–99)

## 2020-07-18 LAB — PROTIME-INR
INR: 0.9 (ref 0.8–1.2)
Prothrombin Time: 12.2 seconds (ref 11.4–15.2)

## 2020-07-18 LAB — APTT: aPTT: 26 seconds (ref 24–36)

## 2020-07-18 LAB — SARS CORONAVIRUS 2 BY RT PCR (HOSPITAL ORDER, PERFORMED IN ~~LOC~~ HOSPITAL LAB): SARS Coronavirus 2: NEGATIVE

## 2020-07-18 MED ORDER — GABAPENTIN 300 MG PO CAPS
300.0000 mg | ORAL_CAPSULE | Freq: Three times a day (TID) | ORAL | Status: DC
  Administered 2020-07-18 – 2020-07-21 (×7): 300 mg via ORAL
  Filled 2020-07-18 (×7): qty 1

## 2020-07-18 MED ORDER — HEPARIN SODIUM (PORCINE) 5000 UNIT/ML IJ SOLN: 4000.0000 [IU] | Freq: Once | INTRAMUSCULAR | Status: DC

## 2020-07-18 MED ORDER — ONDANSETRON HCL 4 MG/2ML IJ SOLN: 4.0000 mg | Freq: Four times a day (QID) | INTRAMUSCULAR | Status: DC | PRN

## 2020-07-18 MED ORDER — ASPIRIN EC 81 MG PO TBEC
81.0000 mg | DELAYED_RELEASE_TABLET | Freq: Every day | ORAL | Status: DC
  Administered 2020-07-19 – 2020-07-21 (×3): 81 mg via ORAL
  Filled 2020-07-18 (×3): qty 1

## 2020-07-18 MED ORDER — ACETAMINOPHEN 650 MG RE SUPP: 650.0000 mg | Freq: Four times a day (QID) | RECTAL | Status: DC | PRN

## 2020-07-18 MED ORDER — ATORVASTATIN CALCIUM 20 MG PO TABS
40.0000 mg | ORAL_TABLET | Freq: Every day | ORAL | Status: DC
  Administered 2020-07-18 – 2020-07-20 (×3): 40 mg via ORAL
  Filled 2020-07-18 (×3): qty 2

## 2020-07-18 MED ORDER — ONDANSETRON HCL 4 MG PO TABS: 4.0000 mg | ORAL_TABLET | Freq: Four times a day (QID) | ORAL | Status: DC | PRN

## 2020-07-18 MED ORDER — HEPARIN (PORCINE) 25000 UT/250ML-% IV SOLN: 10.0000 [IU]/kg/h | INTRAVENOUS | Status: DC

## 2020-07-18 MED ORDER — ACETAMINOPHEN 325 MG PO TABS: 650.0000 mg | ORAL_TABLET | Freq: Four times a day (QID) | ORAL | Status: DC | PRN

## 2020-07-18 MED ORDER — ASPIRIN 81 MG PO CHEW
324.0000 mg | CHEWABLE_TABLET | Freq: Once | ORAL | Status: AC
  Administered 2020-07-18: 324 mg via ORAL
  Filled 2020-07-18: qty 4

## 2020-07-18 MED ORDER — INSULIN ASPART 100 UNIT/ML ~~LOC~~ SOLN
0.0000 [IU] | Freq: Three times a day (TID) | SUBCUTANEOUS | Status: DC
  Administered 2020-07-19 (×2): 1 [IU] via SUBCUTANEOUS
  Administered 2020-07-20: 2 [IU] via SUBCUTANEOUS
  Administered 2020-07-20: 1 [IU] via SUBCUTANEOUS
  Administered 2020-07-20 – 2020-07-21 (×2): 2 [IU] via SUBCUTANEOUS
  Filled 2020-07-18 (×5): qty 1

## 2020-07-18 MED ORDER — SODIUM CHLORIDE 0.9% FLUSH
3.0000 mL | Freq: Two times a day (BID) | INTRAVENOUS | Status: DC
  Administered 2020-07-19 – 2020-07-20 (×2): 3 mL via INTRAVENOUS

## 2020-07-18 MED ORDER — LISINOPRIL 20 MG PO TABS
20.0000 mg | ORAL_TABLET | Freq: Every day | ORAL | Status: DC
  Administered 2020-07-19 – 2020-07-21 (×3): 20 mg via ORAL
  Filled 2020-07-18: qty 1
  Filled 2020-07-18: qty 2
  Filled 2020-07-18: qty 1

## 2020-07-18 MED ORDER — HEPARIN BOLUS VIA INFUSION
4000.0000 [IU] | Freq: Once | INTRAVENOUS | Status: AC
  Administered 2020-07-18: 4000 [IU] via INTRAVENOUS
  Filled 2020-07-18: qty 4000

## 2020-07-18 MED ORDER — METOPROLOL SUCCINATE ER 50 MG PO TB24
50.0000 mg | ORAL_TABLET | Freq: Every day | ORAL | Status: DC
  Administered 2020-07-19: 50 mg via ORAL
  Filled 2020-07-18: qty 1

## 2020-07-18 MED ORDER — HEPARIN (PORCINE) 25000 UT/250ML-% IV SOLN
850.0000 [IU]/h | INTRAVENOUS | Status: DC
  Administered 2020-07-18: 1000 [IU]/h via INTRAVENOUS
  Administered 2020-07-21: 02:00:00 850 [IU]/h via INTRAVENOUS
  Filled 2020-07-18 (×3): qty 250

## 2020-07-18 NOTE — Consult Note (Signed)
ANTICOAGULATION CONSULT NOTE - Initial Consult  Pharmacy Consult for Heparin Infusion Dosing and Monitoring  Indication: chest pain/ACS  Allergies  Allergen Reactions  . Pregabalin     Patient Measurements: Height: 5\' 6"  (167.6 cm) Weight: 104.8 kg (231 lb) IBW/kg (Calculated) : 59.3 Heparin Dosing Weight: 83.3 kg  Vital Signs: Temp: 97.6 F (36.4 C) (08/07 1451) Temp Source: Oral (08/07 1451) BP: 150/66 (08/07 1458) Pulse Rate: 76 (08/07 1458)  Labs: Recent Labs    07/18/20 1441 07/18/20 1650  HGB 13.0  --   HCT 39.2  --   PLT 149*  --   CREATININE 1.30*  --   TROPONINIHS 23* 68*    Estimated Creatinine Clearance: 54.2 mL/min (A) (by C-G formula based on SCr of 1.3 mg/dL (H)).   Medical History: Past Medical History:  Diagnosis Date  . CAD (coronary artery disease)   . Diabetes mellitus   . HTN (hypertension)   . Obesity     Assessment: Pharmacy consulted for heparin infusion dosing and monitoring for 64 yo female for ACS/STEMI.  Troponin HS: 23>> 68  Hgb: 13  Hct: 39  Plts: 149   Goal of Therapy:  Heparin level 0.3-0.7 units/ml Monitor platelets by anticoagulation protocol: Yes   Plan:  Baseline labs ordered Dosing weight: 83.3kg  Give 4000 units bolus x 1 Start heparin infusion at 1000 units/hr Check anti-Xa level in 6 hours and daily while on heparin Continue to monitor H&H and platelets  Pernell Dupre, PharmD, BCPS Clinical Pharmacist 07/18/2020 5:52 PM

## 2020-07-18 NOTE — ED Notes (Signed)
Pt instructed to provided UA specimen- pt verbalizes understanding.

## 2020-07-18 NOTE — H&P (Addendum)
History and Physical    Jasmine Davidson YHC:[id number] DOB: [birth year] DOA: 07/18/2020  PCP: Theotis Burrow, MD  Patient coming from: Home via EMS  I have personally briefly reviewed patient's old medical records in [place]  Chief Complaint: Near syncope  HPI: Jasmine Davidson is a 64 y.o. female with medical history significant for CAD, [id number] complicated by diabetic retinopathy and peripheral neuropathy, CKD stage III, HTN, HLD, and chronic venous insufficiency who presents to the ED for evaluation after a near syncopal episode.  Patient states she was in her usual state of health until around 1 AM morning of July 18, 2020.  She was in the kitchen making herself a smoothie and shortly afterwards had sudden onset of dizziness described as a room spinning sensation.  She then felt lightheaded as if she was going to pass out.  She called her husband who brought her chair to sit down.  She became sweaty, nauseous, and had an episode of vomiting.  She says she did not lose consciousness.  She denied any associated chest pain, palpitations, subjective fever, abdominal pain, dyspnea, or loss of bowel/bladder control.  She has had a recent dry cough which she says is unchanged.  She does report recent episodes of left-sided sharp and pressure-like chest discomfort which has been occurring with exertional activity.  She has a history of CAD with last left heart cath performed at [facility] in September 2015 reported as nonobstructive with 50% mid LAD stenosis that was not hemodynamically significant.  She does take aspirin 81 mg daily.  ED Course:  Initial vitals showed BP 150/66, pulse 76, RR 20, temp 97.6, SPO2 100% on room air.  Labs show WBC 6.2, hemoglobin 13.0, platelets 149,000, sodium 136, potassium 4.3, bicarb 20, BUN 28, creatinine 1.3 (baseline between 1.2-1.4), LFTs within normal limits, high-sensitivity troponin I 23 >> 68.  Urinalysis shows negative nitrites, trace leukocytes,  0-5 RBCs and WBC/hpf, no bacteria on microscopy.  SARS-CoV-2 PCR is obtained and pending.  Portable chest x-ray is negative for focal consolidation, edema, or effusion.  Patient was given aspirin 324 mg and started on heparin infusion.  The hospitalist service was consulted to admit for further evaluation and management.  Review of Systems: All systems reviewed and are negative except as documented in history of present illness above.   Past Medical History:  Diagnosis Date  . CAD (coronary artery disease)   . Diabetes mellitus   . HTN (hypertension)   . Obesity     Past Surgical History:  Procedure Laterality Date  . BLADDER REPAIR W/ CESAREAN SECTION  1981  . BREAST BIOPSY Right 12/17/2014   negative stereotactic  . CARDIAC CATHETERIZATION    . cyst taken off finger  2011  . TOE SURGERY  02/08/2013    Social History:  reports that she has never smoked. She has never used smokeless tobacco. She reports that she does not drink alcohol and does not use drugs.  Allergies  Allergen Reactions  . Pregabalin     Family History  Problem Relation Age of Onset  . Diabetes Father   . Heart failure Father        congestive  . Hypertension Father   . Lupus Father   . COPD Mother      Prior to Admission medications   Medication Sig Start Date End Date Taking? Authorizing Provider  aspirin (ASPIRIN 81) 81 MG EC tablet Take 81 mg by mouth daily.     [provider]  atorvastatin (LIPITOR) 40 MG tablet TAKE ONE TABLET BY MOUTH EVERY DAY Patient taking differently: Take 40 mg by mouth at bedtime.  02/13/14   Minna Merritts, MD  empagliflozin (JARDIANCE) 10 MG TABS tablet Take 10 mg by mouth daily.    [provider]  gabapentin (NEURONTIN) 300 MG capsule Take 300 mg by mouth 3 (three) times daily.     [provider]  insulin NPH-insulin regular (HUMULIN 70/30) (70-30) 100 UNIT/ML injection Inject 15-20 Units into the skin See admin instructions. Inject  20u under the skin every morning and inject 15u under the skin every night    [provider]  liraglutide (VICTOZA) 18 MG/3ML SOPN Inject 0.6 mg into the skin daily.    [provider]  lisinopril (ZESTRIL) 20 MG tablet Take 1 tablet (20 mg total) by mouth daily. 04/18/20 05/18/20  Sidney Ace, MD  metoprolol succinate (TOPROL-XL) 50 MG 24 hr tablet Take 50 mg by mouth daily. 04/27/20   [provider]  Vitamin D, Ergocalciferol, (DRISDOL) 1.25 MG (50000 UNIT) CAPS capsule Take 50,000 Units by mouth once a week. 02/17/20   [provider]    Physical Exam: Vitals:   07/18/20 1451 07/18/20 1458 07/18/20 1503 07/18/20 1801  BP: (!) 159/71 (!) 150/66  (!) 151/69  Pulse: 78 76  76  Resp: 20  12 14   Temp: 97.6 F (36.4 C)     TempSrc: Oral     SpO2: 100% 100%  100%  Weight: 104.8 kg     Height: 5\' 6"  (1.676 m)      Constitutional: Significant bed, NAD, calm, comfortable Eyes: PERRL, lids and conjunctivae normal ENMT: Mucous membranes are moist. Posterior pharynx clear of any exudate or lesions.Normal dentition.  Neck: normal, supple, no masses. Respiratory: clear to auscultation bilaterally, no wheezing, no crackles. Normal respiratory effort. No accessory muscle use.  Cardiovascular: Regular rate and rhythm, no murmurs / rubs / gallops. No extremity edema. 2+ pedal pulses. Abdomen: no tenderness, no masses palpated. No hepatosplenomegaly. Bowel sounds positive.  Musculoskeletal: no clubbing / cyanosis. No joint deformity upper and lower extremities. Good ROM, no contractures. Normal muscle tone.  Skin: no rashes, lesions, ulcers. No induration Neurologic: CN 2-12 grossly intact. Sensation intact, Strength 5/5 in all 4.  Psychiatric: Normal judgment and insight. Alert and oriented x 3. Normal mood.   Labs on Admission: I have personally reviewed following labs and imaging studies  CBC: Recent Labs  Lab 07/18/20 1441  WBC 6.2  NEUTROABS 4.8  HGB  13.0  HCT 39.2  MCV 93.6  PLT 027*   Basic Metabolic Panel: Recent Labs  Lab 07/18/20 1441  NA 136  K 4.3  CL 106  CO2 20*  GLUCOSE 164*  BUN 28*  CREATININE 1.30*  CALCIUM 8.3*   GFR: Estimated Creatinine Clearance: 54.2 mL/min (A) (by C-G formula based on SCr of 1.3 mg/dL (H)). Liver Function Tests: Recent Labs  Lab 07/18/20 1441  AST 15  ALT 13  ALKPHOS 72  BILITOT 1.0  PROT 6.2*  ALBUMIN 3.7   No results for input(s): LIPASE, AMYLASE in the last 168 hours. No results for input(s): AMMONIA in the last 168 hours. Coagulation Profile: Recent Labs  Lab 07/18/20 1441  INR 0.9   Cardiac Enzymes: No results for input(s): CKTOTAL, CKMB, CKMBINDEX, TROPONINI in the last 168 hours. BNP (last 3 results) No results for input(s): PROBNP in the last 8760 hours. HbA1C: No results for input(s):  HGBA1C in the last 72 hours. CBG: No results for input(s): GLUCAP in the last 168 hours. Lipid Profile: No results for input(s): CHOL, HDL, LDLCALC, TRIG, CHOLHDL, LDLDIRECT in the last 72 hours. Thyroid Function Tests: No results for input(s): TSH, T4TOTAL, FREET4, T3FREE, THYROIDAB in the last 72 hours. Anemia Panel: No results for input(s): VITAMINB12, FOLATE, FERRITIN, TIBC, IRON, RETICCTPCT in the last 72 hours. Urine analysis:    Component Value Date/Time   COLORURINE YELLOW (A) 07/18/2020 1650   APPEARANCEUR CLEAR (A) 07/18/2020 1650   APPEARANCEUR Clear 05/31/2014 1920   LABSPEC 1.013 07/18/2020 1650   LABSPEC 1.004 05/31/2014 1920   PHURINE 7.0 07/18/2020 1650   GLUCOSEU >=500 (A) 07/18/2020 1650   GLUCOSEU 50 mg/dL 05/31/2014 1920   HGBUR NEGATIVE 07/18/2020 1650   BILIRUBINUR NEGATIVE 07/18/2020 1650   BILIRUBINUR Negative 05/31/2014 1920   KETONESUR 20 (A) 07/18/2020 1650   PROTEINUR NEGATIVE 07/18/2020 1650   NITRITE NEGATIVE 07/18/2020 1650   LEUKOCYTESUR TRACE (A) 07/18/2020 1650   LEUKOCYTESUR Negative 05/31/2014 1920    Radiological Exams on  Admission: DG Chest Portable 1 View  Result Date: 07/18/2020 CLINICAL DATA:  Near syncope EXAM: PORTABLE CHEST 1 VIEW COMPARISON:  09/05/2008 FINDINGS: Heart and mediastinal contours are within normal limits. No focal opacities or effusions. No acute bony abnormality. IMPRESSION: No active disease. Electronically Signed   By: Rolm Baptise M.D.   On: 07/18/2020 15:31    EKG: Independently reviewed. Normal sinus rhythm without acute ischemic changes.  Not significantly changed when compared to prior.  Assessment/Plan Principal Problem:   Near syncope Active Problems:   Insulin-requiring or dependent type II diabetes mellitus ([place])   Hypertension associated with diabetes ([place])   CAD, NATIVE VESSEL   Hyperlipidemia associated with type 2 diabetes mellitus ([facility])   CKD (chronic kidney disease) stage 3, GFR 30-59 ml/min   Elevated troponin  Jasmine Davidson is a 64 y.o. female with medical history significant for CAD, [id number] complicated by diabetic retinopathy and peripheral neuropathy, CKD stage III, HTN, HLD, and chronic venous insufficiency who is admitted after near syncopal episode.  Near-syncope: History suggestive of vasovagal syncope.  Troponin minimally elevated, EKG without acute ischemic changes or arrhythmia. -Monitor on telemetry -Obtain orthostatic vital signs -Echocardiogram -PT eval  Elevated troponin in history of CAD: High-sensitivity troponin I mildly elevated up to 68 at time of admission.  Last left heart cath 08/12/2014 [facility]: "Non-obstructive coronary artery diseaseincluding a 50% mid LAD stenosis, that was not hemodynamically significant by FFR evaluation."  She does report recent exertional angina type symptoms and May benefit from further evaluation with stress testing versus repeat cardiac catheterization in the future. -Started on heparin gtt in ED, will continue for now -Trend cardiac enzymes -Continue Toprol-XL 50 mg daily -Continue atorvastatin -Continue  aspirin 81 mg daily  Type 2 diabetes: Home regimen includes insulin NPH 12 units twice daily, empagliflozin, and Metformin.  Will hold home medications and place on sensitive SSI while in hospital, adjust as needed.  CKD stage III: Hospitalized in May 2021 for acute renal failure requiring temporary dialysis.  Creatinine at baseline between 1.2-1.4.  Continue to monitor.  Hypertension: Home medications include Lisinopril 20 mg daily, HCTZ 25 mg daily, Toprol-XL 50 mg daily, and verapamil 120 mg daily.  Also on Jardiance. -Continue home Toprol-XL and lisinopril, hold other home antihypertensives for now  Hyperlipidemia: Continue atorvastatin.  DVT prophylaxis: Heparin gtt Code Status: Full code, confirmed with patient Family Communication: Discussed with patient's husband at  bedside Disposition Plan: From home and likely discharge home Consults called: None Admission status:  Status is: Observation  The patient remains OBS appropriate and will d/c before 2 midnights.  Dispo: The patient is from: Home              Anticipated d/c is to: Home              Anticipated d/c date is: 1 day              Patient currently is not medically stable to d/c.   Zada Finders MD [facility]  If 7PM-7AM, please contact night-coverage [email or web address]  07/18/2020, 6:29 PM

## 2020-07-18 NOTE — ED Notes (Signed)
Pt given additional blanket and adjusted in bed, lights dimmed.

## 2020-07-18 NOTE — ED Triage Notes (Addendum)
Pt reports she walked into kitchen 2.5 hours ago (11am) and had headache, dizziness, and episode of N/V. Per EMS, BGL 110. Nausea was relieved by IV zofran given by EMS, pt denies headache at this time, c/o mild CP. Pt reports she has recently been seeing nephrologist. Pt denies cough, SOB, diarrhea.   Pt is AOx4, NAD noted. Pt able to move all four extremities, weakness on left side baseline per pt due to neuropathy.

## 2020-07-18 NOTE — ED Provider Notes (Signed)
[facility] Emergency Department Provider Note   ____________________________________________   First MD Initiated Contact with Patient 07/18/20 1510     (approximate)  I have reviewed the triage vital signs and the nursing notes.   HISTORY  Chief Complaint Near Syncope    HPI Jasmine Davidson is a 64 y.o. female who reports she was in the kitchen making a smoothie and got lightheaded and sweaty and nauseated.  Symptoms lasted until EMS got there.  The nausea improved with Zofran and patient is feeling better now that she is lying down.  She has been having episodes of brief sharp stabbing chest pain as well.  Chest pain only last seconds.  She did not have any coughing or shortness of breath.  She did not have any fever.  She did not lose her sense of taste or smell.  When she checked her blood sugar was high.         Past Medical History:  Diagnosis Date  . CAD (coronary artery disease)   . Diabetes mellitus   . HTN (hypertension)   . Obesity     Patient Active Problem List   Diagnosis Date Noted  . Acute renal failure superimposed on stage 3 chronic kidney disease ([place]) 04/13/2020  . Hyperkalemia   . Cellulitis 08/08/2017  . Insulin-requiring or dependent type II diabetes mellitus ([place]) 11/11/2010  . HYPERLIPIDEMIA-MIXED 11/11/2010  . HTN (hypertension) 11/11/2010  . CAD, NATIVE VESSEL 11/11/2010  . 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 CAROTID ARTERY STENOSIS, WITHOUT INFARCTION 11/11/2010  . SYNCOPE AND COLLAPSE 11/11/2010    Past Surgical History:  Procedure Laterality Date  . BLADDER REPAIR W/ CESAREAN SECTION  1981  . BREAST BIOPSY Right 12/17/2014   negative stereotactic  . CARDIAC CATHETERIZATION    . cyst taken off finger  2011  . TOE SURGERY  02/08/2013    Prior to Admission medications   Medication Sig Start Date End Date Taking? Authorizing Provider  aspirin (ASPIRIN 81) 81 MG EC tablet Take 81 mg by mouth daily.      [provider]  atorvastatin (LIPITOR) 40 MG tablet TAKE ONE TABLET BY MOUTH EVERY DAY Patient taking differently: Take 40 mg by mouth at bedtime.  02/13/14   Minna Merritts, MD  empagliflozin (JARDIANCE) 10 MG TABS tablet Take 10 mg by mouth daily.    [provider]  gabapentin (NEURONTIN) 300 MG capsule Take 300 mg by mouth 3 (three) times daily.     [provider]  insulin NPH-insulin regular (HUMULIN 70/30) (70-30) 100 UNIT/ML injection Inject 15-20 Units into the skin See admin instructions. Inject 20u under the skin every morning and inject 15u under the skin every night    [provider]  liraglutide (VICTOZA) 18 MG/3ML SOPN Inject 0.6 mg into the skin daily.    [provider]  lisinopril (ZESTRIL) 20 MG tablet Take 1 tablet (20 mg total) by mouth daily. 04/18/20 05/18/20  Sidney Ace, MD  metoprolol succinate (TOPROL-XL) 25 MG 24 hr tablet Take 50 mg by mouth daily. 01/22/20   [provider]  Vitamin D, Ergocalciferol, (DRISDOL) 1.25 MG (50000 UNIT) CAPS capsule Take 50,000 Units by mouth once a week. 02/17/20   [provider]    Allergies Pregabalin  Family History  Problem Relation Age of Onset  . Diabetes Father   . Heart failure Father        congestive  . Hypertension Father   .  Lupus Father   . COPD Mother     Social History Social History   Tobacco Use  . Smoking status: Never Smoker  . Smokeless tobacco: Never Used  . Tobacco comment: tobacco use- no   Substance Use Topics  . Alcohol use: No  . Drug use: No    Review of Systems  Constitutional: No fever/chills Eyes: No visual changes. ENT: No sore throat. Cardiovascular: Currently denies chest pain. Respiratory: Denies shortness of breath. Gastrointestinal: No abdominal pain.  Currently no nausea, no vomiting.  No diarrhea.  No constipation. Genitourinary: Negative for dysuria. Musculoskeletal: Negative for back pain. Skin: Negative  for rash. Neurological: Negative for headaches, focal weakness   ____________________________________________   PHYSICAL EXAM:  VITAL SIGNS: ED Triage Vitals [07/18/20 1451]  Enc Vitals Group     BP (!) 159/71     Pulse Rate 78     Resp 20     Temp 97.6 F (36.4 C)     Temp Source Oral     SpO2 100 %     Weight 231 lb (104.8 kg)     Height 5\' 6"  (1.676 m)     Head Circumference      Peak Flow      Pain Score 0     Pain Loc      Pain Edu?      Excl. in [place]?     Constitutional: Alert and oriented. Well appearing and in no acute distress. Eyes: Conjunctivae are normal. PER. EOMI. Head: Atraumatic. Nose: No congestion/rhinnorhea. Mouth/Throat: Mucous membranes are moist.  Oropharynx non-erythematous. Neck: No stridor.  Cardiovascular: Normal rate, regular rhythm. Grossly normal heart sounds.  Good peripheral circulation. Respiratory: Normal respiratory effort.  No retractions. Lungs CTAB. Gastrointestinal: Soft and nontender. No distention. No abdominal bruits.  Musculoskeletal: No lower extremity tenderness bilateral 1+ edema.   Neurologic:  Normal speech and language. No gross focal neurologic deficits are appreciated.  Skin:  Skin is warm, dry and intact. No rash noted.  ____________________________________________   LABS (all labs ordered are listed, but only abnormal results are displayed)  Labs Reviewed  CBC WITH DIFFERENTIAL/PLATELET - Abnormal; Notable for the following components:      Result Value   Platelets 149 (*)    All other components within normal limits  COMPREHENSIVE METABOLIC PANEL - Abnormal; Notable for the following components:   CO2 20 (*)    Glucose, Bld 164 (*)    BUN 28 (*)    Creatinine, Ser 1.30 (*)    Calcium 8.3 (*)    Total Protein 6.2 (*)    GFR calc non Af Amer 44 (*)    GFR calc Af Amer 51 (*)    All other components within normal limits  URINALYSIS, COMPLETE (UACMP) WITH MICROSCOPIC - Abnormal; Notable for the following  components:   Color, Urine YELLOW (*)    APPearance CLEAR (*)    Glucose, UA >=500 (*)    Ketones, ur 20 (*)    Leukocytes,Ua TRACE (*)    All other components within normal limits  TROPONIN I (HIGH SENSITIVITY) - Abnormal; Notable for the following components:   Troponin I (High Sensitivity) 23 (*)    All other components within normal limits  TROPONIN I (HIGH SENSITIVITY) - Abnormal; Notable for the following components:   Troponin I (High Sensitivity) 68 (*)    All other components within normal limits  APTT  PROTIME-INR   ____________________________________________  EKG  EKG read interpreted by me  shows normal sinus rhythm at 78 normal axis no acute ST-T wave changes ____________________________________________  RADIOLOGY  ED MD interpretation: Chest x-ray read by radiology reviewed by me does not show any focal infiltrates  Official radiology report(s): DG Chest Portable 1 View  Result Date: 07/18/2020 CLINICAL DATA:  Near syncope EXAM: PORTABLE CHEST 1 VIEW COMPARISON:  09/05/2008 FINDINGS: Heart and mediastinal contours are within normal limits. No focal opacities or effusions. No acute bony abnormality. IMPRESSION: No active disease. Electronically Signed   By: Rolm Baptise M.D.   On: 07/18/2020 15:31    ____________________________________________   PROCEDURES  Procedure(s) performed (including Critical Care):  Procedures   ____________________________________________   INITIAL IMPRESSION / ASSESSMENT AND PLAN / ED COURSE   Patient with near syncope and atypical chest pain but her troponin is going up.  She has a known history of cardiac disease.  We will have to admit made her and evaluate her heart further.          ____________________________________________   FINAL CLINICAL IMPRESSION(S) / ED DIAGNOSES  Final diagnoses:  Near syncope  Elevated troponin     ED Discharge Orders    None       Note:  This document was prepared using  Dragon voice recognition software and may include unintentional dictation errors.    Nena Polio, MD 07/18/20 1754

## 2020-07-19 ENCOUNTER — Observation Stay (HOSPITAL_BASED_OUTPATIENT_CLINIC_OR_DEPARTMENT_OTHER)
Admit: 2020-07-19 | Discharge: 2020-07-19 | Disposition: A | Discharge status: Home / Self Care | Payer: Medicare HMO | Attending: Internal Medicine | Admitting: Internal Medicine

## 2020-07-19 DIAGNOSIS — Z79899 Other long term (current) drug therapy: Secondary | ICD-10-CM | POA: Diagnosis not present

## 2020-07-19 DIAGNOSIS — E1122 Type 2 diabetes mellitus with diabetic chronic kidney disease: Secondary | ICD-10-CM | POA: Diagnosis present

## 2020-07-19 DIAGNOSIS — R55 Syncope and collapse: Secondary | ICD-10-CM | POA: Diagnosis not present

## 2020-07-19 DIAGNOSIS — E1159 Type 2 diabetes mellitus with other circulatory complications: Secondary | ICD-10-CM

## 2020-07-19 DIAGNOSIS — Z888 Allergy status to other drugs, medicaments and biological substances status: Secondary | ICD-10-CM | POA: Diagnosis not present

## 2020-07-19 DIAGNOSIS — R0683 Snoring: Secondary | ICD-10-CM | POA: Diagnosis present

## 2020-07-19 DIAGNOSIS — Z833 Family history of diabetes mellitus: Secondary | ICD-10-CM | POA: Diagnosis not present

## 2020-07-19 DIAGNOSIS — N1831 Chronic kidney disease, stage 3a: Secondary | ICD-10-CM | POA: Diagnosis present

## 2020-07-19 DIAGNOSIS — E785 Hyperlipidemia, unspecified: Secondary | ICD-10-CM

## 2020-07-19 DIAGNOSIS — I872 Venous insufficiency (chronic) (peripheral): Secondary | ICD-10-CM | POA: Diagnosis present

## 2020-07-19 DIAGNOSIS — E1169 Type 2 diabetes mellitus with other specified complication: Secondary | ICD-10-CM | POA: Diagnosis present

## 2020-07-19 DIAGNOSIS — Z7901 Long term (current) use of anticoagulants: Secondary | ICD-10-CM | POA: Diagnosis not present

## 2020-07-19 DIAGNOSIS — I214 Non-ST elevation (NSTEMI) myocardial infarction: Principal | ICD-10-CM

## 2020-07-19 DIAGNOSIS — I129 Hypertensive chronic kidney disease with stage 1 through stage 4 chronic kidney disease, or unspecified chronic kidney disease: Secondary | ICD-10-CM | POA: Diagnosis present

## 2020-07-19 DIAGNOSIS — I25118 Atherosclerotic heart disease of native coronary artery with other forms of angina pectoris: Secondary | ICD-10-CM | POA: Diagnosis not present

## 2020-07-19 DIAGNOSIS — Z7982 Long term (current) use of aspirin: Secondary | ICD-10-CM | POA: Diagnosis not present

## 2020-07-19 DIAGNOSIS — E782 Mixed hyperlipidemia: Secondary | ICD-10-CM | POA: Diagnosis present

## 2020-07-19 DIAGNOSIS — E11319 Type 2 diabetes mellitus with unspecified diabetic retinopathy without macular edema: Secondary | ICD-10-CM | POA: Diagnosis present

## 2020-07-19 DIAGNOSIS — Z87898 Personal history of other specified conditions: Secondary | ICD-10-CM | POA: Diagnosis not present

## 2020-07-19 DIAGNOSIS — I2510 Atherosclerotic heart disease of native coronary artery without angina pectoris: Secondary | ICD-10-CM

## 2020-07-19 DIAGNOSIS — I10 Essential (primary) hypertension: Secondary | ICD-10-CM

## 2020-07-19 DIAGNOSIS — I9589 Other hypotension: Secondary | ICD-10-CM | POA: Diagnosis not present

## 2020-07-19 DIAGNOSIS — E1140 Type 2 diabetes mellitus with diabetic neuropathy, unspecified: Secondary | ICD-10-CM | POA: Diagnosis present

## 2020-07-19 DIAGNOSIS — I951 Orthostatic hypotension: Secondary | ICD-10-CM | POA: Diagnosis present

## 2020-07-19 DIAGNOSIS — Z8249 Family history of ischemic heart disease and other diseases of the circulatory system: Secondary | ICD-10-CM | POA: Diagnosis not present

## 2020-07-19 DIAGNOSIS — Z8616 Personal history of COVID-19: Secondary | ICD-10-CM | POA: Diagnosis not present

## 2020-07-19 DIAGNOSIS — I2584 Coronary atherosclerosis due to calcified coronary lesion: Secondary | ICD-10-CM | POA: Diagnosis present

## 2020-07-19 DIAGNOSIS — I152 Hypertension secondary to endocrine disorders: Secondary | ICD-10-CM | POA: Diagnosis present

## 2020-07-19 DIAGNOSIS — R778 Other specified abnormalities of plasma proteins: Secondary | ICD-10-CM | POA: Diagnosis present

## 2020-07-19 DIAGNOSIS — E1165 Type 2 diabetes mellitus with hyperglycemia: Secondary | ICD-10-CM | POA: Diagnosis present

## 2020-07-19 DIAGNOSIS — Z794 Long term (current) use of insulin: Secondary | ICD-10-CM | POA: Diagnosis not present

## 2020-07-19 DIAGNOSIS — Z20822 Contact with and (suspected) exposure to covid-19: Secondary | ICD-10-CM | POA: Diagnosis present

## 2020-07-19 DIAGNOSIS — Z9189 Other specified personal risk factors, not elsewhere classified: Secondary | ICD-10-CM

## 2020-07-19 LAB — BASIC METABOLIC PANEL
Anion gap: 8 (ref 5–15)
BUN: 30 mg/dL — ABNORMAL HIGH (ref 8–23)
CO2: 25 mmol/L (ref 22–32)
Calcium: 8.8 mg/dL — ABNORMAL LOW (ref 8.9–10.3)
Chloride: 105 mmol/L (ref 98–111)
Creatinine, Ser: 1.27 mg/dL — ABNORMAL HIGH (ref 0.44–1.00)
GFR calc Af Amer: 52 mL/min — ABNORMAL LOW (ref >60)
GFR calc non Af Amer: 45 mL/min — ABNORMAL LOW (ref >60)
Glucose, Bld: 146 mg/dL — ABNORMAL HIGH (ref 70–99)
Potassium: 4.1 mmol/L (ref 3.5–5.1)
Sodium: 138 mmol/L (ref 135–145)

## 2020-07-19 LAB — HEMOGLOBIN A1C
Hgb A1c MFr Bld: 5.8 % — ABNORMAL HIGH (ref 4.8–5.6)
Mean Plasma Glucose: 119.76 mg/dL

## 2020-07-19 LAB — HEPARIN LEVEL (UNFRACTIONATED)
Heparin Unfractionated: 0.76 IU/mL — ABNORMAL HIGH (ref 0.30–0.70)
Heparin Unfractionated: 0.76 IU/mL — ABNORMAL HIGH (ref 0.30–0.70)
Heparin Unfractionated: 0.5 IU/mL (ref 0.30–0.70)

## 2020-07-19 LAB — CBC
HCT: 37.6 % (ref 36.0–46.0)
Hemoglobin: 12.5 g/dL (ref 12.0–15.0)
MCH: 31 pg (ref 26.0–34.0)
MCHC: 33.2 g/dL (ref 30.0–36.0)
MCV: 93.3 fL (ref 80.0–100.0)
Platelets: 155 10*3/uL (ref 150–400)
RBC: 4.03 MIL/uL (ref 3.87–5.11)
RDW: 12.5 % (ref 11.5–15.5)
WBC: 4 10*3/uL (ref 4.0–10.5)
nRBC: 0 % (ref 0.0–0.2)

## 2020-07-19 LAB — ECHOCARDIOGRAM COMPLETE
AR max vel: 2.4 cm2
AV Area VTI: 2.74 cm2
AV Area mean vel: 2.44 cm2
AV Mean grad: 3 mmHg
AV Peak grad: 5 mmHg
Ao pk vel: 1.12 m/s
Area-P 1/2: 3.5 cm2
Height: 66 in
S' Lateral: 2.34 cm
Weight: 3696 oz

## 2020-07-19 LAB — GLUCOSE, CAPILLARY
Glucose-Capillary: 130 mg/dL — ABNORMAL HIGH (ref 70–99)
Glucose-Capillary: 146 mg/dL — ABNORMAL HIGH (ref 70–99)
Glucose-Capillary: 194 mg/dL — ABNORMAL HIGH (ref 70–99)

## 2020-07-19 LAB — TSH: TSH: 1.275 u[IU]/mL (ref 0.350–4.500)

## 2020-07-19 MED ORDER — LABETALOL HCL 5 MG/ML IV SOLN: 10.0000 mg | INTRAVENOUS | Status: DC | PRN

## 2020-07-19 MED ORDER — CARVEDILOL 3.125 MG PO TABS
12.5000 mg | ORAL_TABLET | Freq: Two times a day (BID) | ORAL | Status: DC
  Administered 2020-07-19 – 2020-07-21 (×4): 12.5 mg via ORAL
  Filled 2020-07-19 (×4): qty 4

## 2020-07-19 NOTE — Care Management Obs Status (Signed)
[place] NOTIFICATION   Patient Details  Name: Jasmine Davidson MRN: [id number] Date of Birth: 02-Mar-1956   Medicare Observation Status Notification Given:       Boris Sharper, LCSW 07/19/2020, 3:29 PM

## 2020-07-19 NOTE — Consult Note (Signed)
Cardiology Consultation:   Patient ID: Jasmine Davidson MRN: [id number]; DOB: 1956/04/25  Admit date: 07/18/2020 Date of Consult: 07/19/2020  Primary Care Provider: Theotis Burrow, MD [place] Cardiologist: No primary care provider on file.  [facility] Electrophysiologist:  None    Patient Profile:   Jasmine Davidson is a 64 y.o. female with a hx of CAD, diabetes, CKD 3, hypertension, hyperlipidemia, and venous insufficiency who is being seen today for the evaluation of near syncope at the request of Dr. Posey Pronto.  History of Present Illness:   Jasmine Davidson was in her usual state of health until she had an episode of dizziness while in the kitchen yesterday.  She got up to make a smoothie.  She was able to do so without any problems.  However she then developed severe dizziness and presyncope.  There was no associated chest pain or shortness of breath at that time.  She does note that she has had some episodes of left-sided chest discomfort over the last several weeks.  It occurs mostly with exertion.  She has been generally fatigued and not feeling well.  She notes that when she tries to do her housework she has to stop after 1 room and cannot complete the whole thing at a time.  This has been getting gradually worse.  She reports some episodes of palpitations that are associated with lightheadedness and dizziness.  She came to the ED for further evaluation.  In the ED she was hypertensive to 150/66.  Labs were notable for high-sensitivity troponin that peaked at 70.  Cardiology was consulted for further evaluation and management.  She is currently feeling well and has no chest pain or lightheadedness.  When she tried to get up and work with physical therapy she felt poorly.  She notes that she had COVID-19 in December 2020 and wonders if this could be related.  Jasmine Davidson  had a cath at [facility] in 2015 that revealed 50% mid LAD stenosis that was not thought to be hemodynamically  significant.  She has been medically managed.   Past Medical History:  Diagnosis Date  . CAD (coronary artery disease)   . CKD (chronic kidney disease) stage 3, GFR 30-59 ml/min   . Diabetes mellitus   . HTN (hypertension)   . Obesity     Past Surgical History:  Procedure Laterality Date  . BLADDER REPAIR W/ CESAREAN SECTION  1981  . BREAST BIOPSY Right 12/17/2014   negative stereotactic  . CARDIAC CATHETERIZATION    . cyst taken off finger  2011  . TOE SURGERY  02/08/2013     Home Medications:  Prior to Admission medications   Medication Sig Start Date End Date Taking? Authorizing Provider  aspirin (ASPIRIN 81) 81 MG EC tablet Take 81 mg by mouth daily.    Yes [provider]  atorvastatin (LIPITOR) 40 MG tablet TAKE ONE TABLET BY MOUTH EVERY DAY Patient taking differently: Take 40 mg by mouth at bedtime.  02/13/14  Yes Gollan, Kathlene November, MD  Cholecalciferol (EQL VITAMIN D3) 25 MCG (1000 UT) capsule Take 1,000 Units by mouth daily.   Yes [provider]  empagliflozin (JARDIANCE) 10 MG TABS tablet Take 10 mg by mouth daily.   Yes [provider]  gabapentin (NEURONTIN) 300 MG capsule Take 300 mg by mouth 3 (three) times daily.    Yes [provider]  hydrochlorothiazide (HYDRODIURIL) 25 MG tablet Take 25 mg by mouth daily.   Yes [provider]  insulin NPH-insulin regular (HUMULIN 70/30) (70-30) 100 UNIT/ML injection Inject 10-12 Units into the skin See admin instructions. Inject 12 u under the skin every morning and inject 10 u under the skin every night   Yes [provider]  liraglutide (VICTOZA) 18 MG/3ML SOPN Inject 0.6 mg into the skin daily.   Yes [provider]  lisinopril (ZESTRIL) 20 MG tablet Take 1 tablet (20 mg total) by mouth daily. 04/18/20 07/18/20 Yes Sreenath, Sudheer B, MD  metFORMIN (GLUMETZA) 1000 MG (MOD) 24 hr tablet Take 1,000 mg by mouth 2 (two) times daily with a meal.   Yes [provider]    metoprolol succinate (TOPROL-XL) 50 MG 24 hr tablet Take 50 mg by mouth daily. 04/27/20  Yes [provider]  verapamil (CALAN-SR) 120 MG CR tablet Take 120 mg by mouth daily. 01/22/20  Yes [provider]    Inpatient Medications: Scheduled Meds: . aspirin EC  81 mg Oral Daily  . atorvastatin  40 mg Oral QHS  . gabapentin  300 mg Oral TID  . insulin aspart  0-9 Units Subcutaneous TID WC  . lisinopril  20 mg Oral Daily  . metoprolol succinate  50 mg Oral Daily  . sodium chloride flush  3 mL Intravenous Q12H   Continuous Infusions: . heparin 950 Units/hr (07/19/20 0348)   PRN Meds: acetaminophen **OR** acetaminophen, ondansetron **OR** ondansetron (ZOFRAN) IV  Allergies:    Allergies  Allergen Reactions  . Pregabalin     Social History:   Social History   Socioeconomic History  . Marital status: Married    Spouse name: Not on file  . Number of children: Not on file  . Years of education: Not on file  . Highest education level: Not on file  Occupational History  . Not on file  Tobacco Use  . Smoking status: Never Smoker  . Smokeless tobacco: Never Used  . Tobacco comment: tobacco use- no   Substance and Sexual Activity  . Alcohol use: No  . Drug use: No  . Sexual activity: Not on file  Other Topics Concern  . Not on file  Social History Narrative   Full time. Does not regularly exercise.    Social Determinants of Health   Financial Resource Strain:   . Difficulty of Paying Living Expenses:   Food Insecurity:   . Worried About Charity fundraiser in the Last Year:   . Arboriculturist in the Last Year:   Transportation Needs:   . Film/video editor (Medical):   Marland Kitchen Lack of Transportation (Non-Medical):   Physical Activity:   . Days of Exercise per Week:   . Minutes of Exercise per Session:   Stress:   . Feeling of Stress :   Social Connections:   . Frequency of Communication with Friends and Family:   . Frequency of Social Gatherings  with Friends and Family:   . Attends Religious Services:   . Active Member of Clubs or Organizations:   . Attends Archivist Meetings:   Marland Kitchen Marital Status:   Intimate Partner Violence:   . Fear of Current or Ex-Partner:   . Emotionally Abused:   Marland Kitchen Physically Abused:   . Sexually Abused:     Family History:    Family History  Problem Relation Age of Onset  . Diabetes Father   . Heart failure Father        congestive  . Hypertension Father   .  Lupus Father   . COPD Mother      ROS:  Please see the history of present illness.  All other ROS reviewed and negative.     Physical Exam/Data:   Vitals:   07/19/20 0400 07/19/20 0604 07/19/20 0608 07/19/20 1030  BP: (!) 117/59 (!) 144/64  (!) 144/61  Pulse: 67  71   Resp: 13  14 17   Temp:      TempSrc:      SpO2: 99%  100%   Weight:      Height:       No intake or output data in the 24 hours ending 07/19/20 1141 Last 3 Weights 07/18/2020 04/15/2020 04/13/2020  Weight (lbs) 231 lb 262 lb 9.1 oz 255 lb 8.2 oz  Weight (kg) 104.781 kg 119.1 kg 115.9 kg     VS:  BP (!) 176/88 (BP Location: Left Arm)   Pulse 70   Temp 97.6 F (36.4 C) (Oral)   Resp 17   Ht 5\' 6"  (1.676 m)   Wt 104.8 kg   LMP 09/12/1999   SpO2 100%   BMI 37.28 kg/m  , BMI Body mass index is 37.28 kg/m. GENERAL:  Well appearing HEENT: Pupils equal round and reactive, fundi not visualized, oral mucosa unremarkable NECK:  No jugular venous distention, waveform within normal limits, carotid upstroke brisk and symmetric, no bruits LUNGS:  Clear to auscultation bilaterally HEART:  RRR.  PMI not displaced or sustained,S1 and S2 within normal limits, no S3, no S4, no clicks, no rubs, no murmurs ABD:  Flat, positive bowel sounds normal in frequency in pitch, no bruits, no rebound, no guarding, no midline pulsatile mass, no hepatomegaly, no splenomegaly EXT:  2 plus pulses throughout, trace edema, no cyanosis no clubbing SKIN: Chronic stasis dermatitis of the  lower extremities NEURO:  Cranial nerves II through XII grossly intact, motor grossly intact throughout PSYCH:  Cognitively intact, oriented to person place and time  EKG:  The EKG was personally reviewed and demonstrates:  07/18/2020: Sinus rhythm.  Rate 78 bpm.  Low voltage. Telemetry:  Telemetry was personally reviewed and demonstrates:  Sinus rhythm.  No events  Relevant CV Studies: Echo 07/19/20: IMPRESSIONS  1. Left ventricular ejection fraction, by estimation, is 65 to 70%. The  left ventricle has normal function. The left ventricle has no regional  wall motion abnormalities. There is mild concentric left ventricular  hypertrophy. Left ventricular diastolic  parameters are consistent with Grade I diastolic dysfunction (impaired  relaxation).  2. Right ventricular systolic function is normal. The right ventricular  size is normal.  3. Left atrial size was moderately dilated.  4. The mitral valve is normal in structure. No evidence of mitral valve  regurgitation. No evidence of mitral stenosis.  5. The aortic valve was not well visualized. Aortic valve regurgitation  is not visualized. No aortic stenosis is present.   Left heart cath 08/11/2014: Long, calcified 50% mid LAD stenosis.  FFR equals 0.88.  LVEF 60%.  Nonobstructive renal and SFA disease.  LVEDP 11 mmHg.  Laboratory Data:  High Sensitivity Troponin:   Recent Labs  Lab 07/18/20 1441 07/18/20 1650 07/18/20 1946 07/18/20 2123  TROPONINIHS 23* 68* 70* 53*     Chemistry Recent Labs  Lab 07/18/20 1441 07/19/20 0208  NA 136 138  K 4.3 4.1  CL 106 105  CO2 20* 25  GLUCOSE 164* 146*  BUN 28* 30*  CREATININE 1.30* 1.27*  CALCIUM 8.3* 8.8*  GFRNONAA 44* 45*  GFRAA 51* 52*  ANIONGAP 10 8    Recent Labs  Lab 07/18/20 1441  PROT 6.2*  ALBUMIN 3.7  AST 15  ALT 13  ALKPHOS 72  BILITOT 1.0   Hematology Recent Labs  Lab 07/18/20 1441 07/19/20 0208  WBC 6.2 4.0  RBC 4.19 4.03  HGB 13.0 12.5  HCT  39.2 37.6  MCV 93.6 93.3  MCH 31.0 31.0  MCHC 33.2 33.2  RDW 12.4 12.5  PLT 149* 155   BNPNo results for input(s): BNP, PROBNP in the last 168 hours.  DDimer No results for input(s): DDIMER in the last 168 hours.   Radiology/Studies:  DG Chest Portable 1 View  Result Date: 07/18/2020 CLINICAL DATA:  Near syncope EXAM: PORTABLE CHEST 1 VIEW COMPARISON:  09/05/2008 FINDINGS: Heart and mediastinal contours are within normal limits. No focal opacities or effusions. No acute bony abnormality. IMPRESSION: No active disease. Electronically Signed   By: Rolm Baptise M.D.   On: 07/18/2020 15:31   {  Assessment and Plan:   # Pre-syncope:  # NSTEMI: # Hyperlipidemia: Echo unremarkable.  HS-troponin mildly elevated to 70.  No abnormalities noted on EKG or telemetry.  HS-troponin is mildly elevated and she has little exertional chest pain and known LAD disease.  We will plan for a left heart catheterization tomorrow.  She also reports snoring, daytime somnolence, and not feeling rested in the mornings.  Her husband does not think she is had any apnea.  It is definitely worth testing for sleep apnea as this could also be contributing.  Continue aspirin, atorvastatin, and heparin.  Check orthostatic vital signs.  Lipid panel pending.   Risks and benefits of cardiac catheterization have been discussed with the patient.  The patient understands that risks included but are not limited to stroke (1 in 1000), death (1 in 10), kidney failure [usually temporary] (1 in 500), bleeding (1 in 200), allergic reaction [possibly serious] (1 in 200). The patient understands and agrees to proceed.   # Essential hypertension:  Her BP is poorly controlled.  She takes HCTZ, metoprolol, lisinopril, and verapamil at home.  She is currently on metoprolol.  In the past she is had acute on chronic renal failure.  Renal function is currently stable.  Her home HCTZ has been held.  She is on lisinopril and metoprolol here.  We  will switch the metoprolol to carvedilol to see if that helps better control her blood pressure.  # Snoring:  # Daytime somnolence: Recommend outpatient sleep study    For questions or updates, please contact [facility] Please consult [email or web address] for contact info under    Signed, Skeet Latch, MD  07/19/2020 11:41 AM

## 2020-07-19 NOTE — Consult Note (Signed)
ANTICOAGULATION CONSULT NOTE  Pharmacy Consult for Heparin Infusion Dosing and Monitoring  Indication: chest pain/ACS  Patient Measurements: Height: 5\' 6"  (167.6 cm) Weight: 104.8 kg (231 lb) IBW/kg (Calculated) : 59.3 Heparin Dosing Weight: 83.3 kg  Vital Signs: BP: 144/61 (08/08 1030) Pulse Rate: 71 (08/08 0608)  Labs: Recent Labs    07/18/20 1441 07/18/20 1441 07/18/20 1650 07/18/20 1946 07/18/20 2123 07/19/20 0208  HGB 13.0  --   --   --   --  12.5  HCT 39.2  --   --   --   --  37.6  PLT 149*  --   --   --   --  155  APTT 26  --   --   --   --   --   LABPROT 12.2  --   --   --   --   --   INR 0.9  --   --   --   --   --   HEPARINUNFRC  --   --   --   --   --  0.76*  CREATININE 1.30*  --   --   --   --  1.27*  TROPONINIHS 23*   < > 68* 70* 53*  --    < > = values in this interval not displayed.    Estimated Creatinine Clearance: 55.5 mL/min (A) (by C-G formula based on SCr of 1.27 mg/dL (H)).   Medical History: Past Medical History:  Diagnosis Date  . CAD (coronary artery disease)   . CKD (chronic kidney disease) stage 3, GFR 30-59 ml/min   . Diabetes mellitus   . HTN (hypertension)   . Obesity     Assessment: Pharmacy consulted for heparin infusion dosing and monitoring for 64 yo female for ACS/STEMI.  Troponin HS now trending down, H&H, platelets are stable. Chronic anticoagulation prior to admission includes only aspirin.   Heparin Course: 08/07 initiation: 4000 unit bolus, then 1000 units/hr 08/08 0208 HL 0.76: reduce to 950 units/hr 08/08 1205 HL 0.76: reduce to 850 units/hr  Goal of Therapy:  Heparin level 0.3-0.7 units/ml Monitor platelets by anticoagulation protocol: Yes   Plan:   Heparin level remains slightly supratherapeutic:    Reduce heparin infusion to 850 units/hr  Re-check anti-Xa level in 6 hours after rate change and daily while on heparin  Continue to monitor H&H and platelets  Dallie Piles, PharmD Clinical  Pharmacist 07/19/2020 11:00 AM

## 2020-07-19 NOTE — Evaluation (Signed)
Physical Therapy Evaluation Patient Details Name: AANCHAL COPE MRN: [id number] DOB: 21-Oct-1956 Today's Date: 07/19/2020   History of Present Illness  64 yo Female was brought to ED for near syncopal episode, with elevated troponin and dizziness, light headed feelings and vomiting.  PMHx:  HTN, DM, CAD, CKD3, chronic foot ulcers, obesity, bladder repair, HLD  Clinical Impression  Pt was seen for mobility on hallway with IV pole for light support.  Pt is stopping frequently, and note O2 sats drop down with elevated HR and resp rate, with lowest sat at 81%.  BP is stable with all mobility with regular checks from the telemetry.  Prompted pt to sit and rest and then sat remained at 96% for final leg of gait.  Will monitor her HR and sats with gait acutely, and see what AD if any are needed for safely managing gait.  Husband was present initially and then left for lunch before PT had finished.      Follow Up Recommendations Home health PT;Supervision for mobility/OOB    Equipment Recommendations  None recommended by PT    Recommendations for Other Services       Precautions / Restrictions Precautions Precautions: Fall Precaution Comments: monitor HR and sats Restrictions Weight Bearing Restrictions: No      Mobility  Bed Mobility Overal bed mobility: Needs Assistance Bed Mobility: Supine to Sit;Sit to Supine     Supine to sit: Min guard Sit to supine: Min guard   General bed mobility comments: min guard for safety  Transfers Overall transfer level: Needs assistance Equipment used: None Transfers: Sit to/from Stand Sit to Stand: Min assist (from lower heights)         General transfer comment: supervision from higher surfaces and min assist from lower height  Ambulation/Gait Ambulation/Gait assistance: Min guard Gait Distance (Feet): 140 Feet Assistive device: IV Pole;1 person hand held assist Gait Pattern/deviations: Step-through pattern;Decreased stride length;Wide  base of support (pt takes several standing rests) Gait velocity: redcued Gait velocity interpretation: <1.31 ft/sec, indicative of household ambulator General Gait Details: HR at 141 by end of walk and desaturated 3x during walk with standing rest breaks  Stairs            Wheelchair Mobility    Modified Rankin (Stroke Patients Only)       Balance Overall balance assessment: Needs assistance Sitting-balance support: Feet supported Sitting balance-Leahy Scale: Good     Standing balance support: Bilateral upper extremity supported;During functional activity Standing balance-Leahy Scale: Fair Standing balance comment: fair standing and fair with slower paced walk                             Pertinent Vitals/Pain Pain Assessment: No/denies pain    Home Living Family/patient expects to be discharged to:: Private residence Living Arrangements: Spouse/significant other Available Help at Discharge: Family;Available 24 hours/day Type of Home: House Home Access: Stairs to enter Entrance Stairs-Rails: Left;Right;Can reach both Entrance Stairs-Number of Steps: 2 Home Layout: One level Home Equipment: Walker - 2 wheels Additional Comments: pt has been independent at home, able to do self care    Prior Function Level of Independence: Independent               Hand Dominance   Dominant Hand: Right    Extremity/Trunk Assessment   Upper Extremity Assessment Upper Extremity Assessment: Overall WFL for tasks assessed    Lower Extremity Assessment Lower Extremity Assessment: Overall [facility]  for tasks assessed    Cervical / Trunk Assessment Cervical / Trunk Assessment: Kyphotic  Communication   Communication: No difficulties  Cognition Arousal/Alertness: Awake/alert Behavior During Therapy: WFL for tasks assessed/performed Overall Cognitive Status: Within Functional Limits for tasks assessed                                         General Comments General comments (skin integrity, edema, etc.): Pt may need an assistive device but medically is not stable and may account for her presentation of steadiness with gait    Exercises     Assessment/Plan    PT Assessment Patient needs continued PT services  PT Problem List Cardiopulmonary status limiting activity;Decreased activity tolerance;Decreased balance;Decreased coordination       PT Treatment Interventions DME instruction;Gait training;Stair training;Functional mobility training;Therapeutic activities;Therapeutic exercise;Balance training;Neuromuscular re-education;Patient/family education    PT Goals (Current goals can be found in the Care Plan section)  Acute Rehab PT Goals Patient Stated Goal: to get home and feel better PT Goal Formulation: With patient Time For Goal Achievement: 08/02/20 Potential to Achieve Goals: Good    Frequency Min 2X/week   Barriers to discharge Inaccessible home environment home with husband PRN    Co-evaluation               AM-PAC PT "6 Clicks" Mobility  Outcome Measure Help needed turning from your back to your side while in a flat bed without using bedrails?: None Help needed moving from lying on your back to sitting on the side of a flat bed without using bedrails?: None Help needed moving to and from a bed to a chair (including a wheelchair)?: A Little Help needed standing up from a chair using your arms (e.g., wheelchair or bedside chair)?: A Little Help needed to walk in hospital room?: A Little Help needed climbing 3-5 steps with a railing? : A Little 6 Click Score: 20    End of Session   Activity Tolerance: Patient limited by fatigue;Treatment limited secondary to medical complications (Comment) Patient left: in bed;with call bell/phone within reach Nurse Communication: Mobility status PT Visit Diagnosis: Unsteadiness on feet (R26.81);Difficulty in walking, not elsewhere classified (R26.2)    Time:  1601-0932 PT Time Calculation (min) (ACUTE ONLY): 24 min   Charges:   PT Evaluation $PT Eval Moderate Complexity: 1 Mod PT Treatments $Gait Training: 8-22 mins       Ramond Dial 07/19/2020, 1:33 PM   Mee Hives, PT MS Acute Rehab Dept. Number: [place]

## 2020-07-19 NOTE — Progress Notes (Signed)
PROGRESS NOTE  CHERELL COLVIN HYI:[id number] DOB: [birth year]   PCP: Theotis Burrow, MD  Patient is from: Home.  DOA: 07/18/2020 LOS: 0  Brief Narrative / Interim history: 64 year old female with PMH of CAD with 50% mid LAD stenosis based on card in 2015 at [facility], IDDM-2 with retinopathy and neuropathy, CKD-3A, HTN, HLD and chronic venous sufficiency presenting after near syncope associated with diaphoresis, nausea and emesis. She also has some exertional dyspnea and chest pain intermittently. She reports some abnormal heart rhythm in the past but never been told if she had A. fib or a flutter.  In ED, hemodynamically stable. 100% on RA. CBC, CMP, UA and CXR without significant finding. Sensitivity troponin 23> 68. EKG NSR without acute ischemic finding abnormal interval. COVID-19 PCR negative. Started on IV heparin. She was admitted for evaluation for near syncope and elevated troponin.  Subjective: Seen and examined earlier this morning. No major events overnight. Reports some odd sensation about her right eye. She was not able to describe or be more specific. She denies eye pain or vision change. She denies dizziness/lightheadedness, chest pain, dyspnea, GI or UTI symptoms.  Objective: Vitals:   07/19/20 1130 07/19/20 1255 07/19/20 1316 07/19/20 1648  BP: (!) 145/68 (!) 150/74 (!) 176/88 (!) 168/77  Pulse: 73 68 70 67  Resp: 15 17 17 18   Temp:   97.6 F (36.4 C) 98.1 F (36.7 C)  TempSrc:   Oral   SpO2: 98% 100% 100% 100%  Weight:      Height:       No intake or output data in the 24 hours ending 07/19/20 1655 Filed Weights   07/18/20 1451  Weight: 104.8 kg    Examination:  GENERAL: No apparent distress.  Nontoxic. HEENT: MMM. PERRL. EOMI. No swelling or conjunctival injection. Vision grossly intact. NECK: Supple.  No apparent JVD.  RESP:  No IWOB.  Fair aeration bilaterally. CVS:  RRR. Heart sounds normal.  ABD/GI/GU: BS+. Abd soft, NTND.  MSK/EXT:  Moves  extremities. No apparent deformity. No edema.  SKIN: no apparent skin lesion or wound NEURO: Awake, alert and oriented appropriately.  No apparent focal neuro deficit. PSYCH: Calm. Normal affect.  Procedures:  None  Microbiology summarized: COVID-19 PCR negative.  Assessment & Plan: NSTEMI/history of CAD: LHC in 08/2014 at [facility] with 50% mid LAD stenosis. She reports exertional dyspnea and chest discomfort intermittently. Currently chest pain-free. Mild elevated troponin with delta. EKG without acute ischemic finding. Echo with EF of 65 to 70%, mild concentric LVH, G1 DD, moderate. Family history of CHF in her father in his 73s. Significant risk factors. -Continue metoprolol, atorvastatin and aspirin. -Cardiology, Dr. Oval Linsey consulted. -Continue IV heparin for now  Near-syncope: Vasovagal syncope? Mild elevated troponin with delta. EKG without acute ischemic finding. Currently asymptomatic. LAE -Orthostatic vitals -Cardiology consult -Check TSH -PT eval   Controlled DM-2 with hyperglycemia and CKD-3A: A1c 5.8%. On Metformin, Jardiance, Victoza and 70/30 insulin at home. Recent Labs    07/18/20 1946  HGBA1C 5.8*   Recent Labs  Lab 07/18/20 2121 07/19/20 0954 07/19/20 1647  GLUCAP 179* 130* 146*  -Continue CBG monitoring and SSI insulin here -Check hemoglobin A1c -Check lipid panel  CKD stage IIIA: Stable. -Continue monitoring  Essential hypertension: On lisinopril, HCTZ, metoprolol and verapamil. BP 176/88 -Continue home lisinopril and metoprolol -Add as needed labetalol. -Continue holding HCTZ and verapamil  At risk for sleep apnea -would benefit from outpatient sleep study  Hyperlipidemia: -Check lipid panel -Continue atorvastatin.  Body mass index is 37.28 kg/m.         DVT prophylaxis: On full dose heparin    Code Status: Full code Family Communication: Updated patient's husband at bedside. Status is: Observation  The patient will require  care spanning > 2 midnights and should be moved to inpatient because: Ongoing diagnostic testing needed not appropriate for outpatient work up, IV treatments appropriate due to intensity of illness or inability to take PO and Inpatient level of care appropriate due to severity of illness  Dispo: The patient is from: Home              Anticipated d/c is to: Home              Anticipated d/c date is: 2 days              Patient currently is not medically stable to d/c.       Consultants:  Cardiology   Sch Meds:  Scheduled Meds: . aspirin EC  81 mg Oral Daily  . atorvastatin  40 mg Oral QHS  . carvedilol  12.5 mg Oral BID WC  . gabapentin  300 mg Oral TID  . insulin aspart  0-9 Units Subcutaneous TID WC  . lisinopril  20 mg Oral Daily  . sodium chloride flush  3 mL Intravenous Q12H   Continuous Infusions: . heparin 950 Units/hr (07/19/20 0348)   PRN Meds:.acetaminophen **OR** acetaminophen, labetalol, ondansetron **OR** ondansetron (ZOFRAN) IV  Antimicrobials: Anti-infectives (From admission, onward)   None       I have personally reviewed the following labs and images: CBC: Recent Labs  Lab 07/18/20 1441 07/19/20 0208  WBC 6.2 4.0  NEUTROABS 4.8  --   HGB 13.0 12.5  HCT 39.2 37.6  MCV 93.6 93.3  PLT 149* 155   BMP &GFR Recent Labs  Lab 07/18/20 1441 07/18/20 1946 07/19/20 0208  NA 136  --  138  K 4.3  --  4.1  CL 106  --  105  CO2 20*  --  25  GLUCOSE 164*  --  146*  BUN 28*  --  30*  CREATININE 1.30*  --  1.27*  CALCIUM 8.3*  --  8.8*  MG  --  1.8  --    Estimated Creatinine Clearance: 55.5 mL/min (A) (by C-G formula based on SCr of 1.27 mg/dL (H)). Liver & Pancreas: Recent Labs  Lab 07/18/20 1441  AST 15  ALT 13  ALKPHOS 72  BILITOT 1.0  PROT 6.2*  ALBUMIN 3.7   No results for input(s): LIPASE, AMYLASE in the last 168 hours. No results for input(s): AMMONIA in the last 168 hours. Diabetic: Recent Labs    07/18/20 1946  HGBA1C 5.8*     Recent Labs  Lab 07/18/20 2121 07/19/20 0954 07/19/20 1647  GLUCAP 179* 130* 146*   Cardiac Enzymes: No results for input(s): CKTOTAL, CKMB, CKMBINDEX, TROPONINI in the last 168 hours. No results for input(s): PROBNP in the last 8760 hours. Coagulation Profile: Recent Labs  Lab 07/18/20 1441  INR 0.9   Thyroid Function Tests: Recent Labs    07/19/20 0208  TSH 1.275   Lipid Profile: No results for input(s): CHOL, HDL, LDLCALC, TRIG, CHOLHDL, LDLDIRECT in the last 72 hours. Anemia Panel: No results for input(s): VITAMINB12, FOLATE, FERRITIN, TIBC, IRON, RETICCTPCT in the last 72 hours. Urine analysis:    Component Value Date/Time   COLORURINE YELLOW (A) 07/18/2020 1650   APPEARANCEUR CLEAR (A) 07/18/2020 1650  APPEARANCEUR Clear 05/31/2014 1920   LABSPEC 1.013 07/18/2020 1650   LABSPEC 1.004 05/31/2014 1920   PHURINE 7.0 07/18/2020 1650   GLUCOSEU >=500 (A) 07/18/2020 1650   GLUCOSEU 50 mg/dL 05/31/2014 1920   HGBUR NEGATIVE 07/18/2020 1650   BILIRUBINUR NEGATIVE 07/18/2020 1650   BILIRUBINUR Negative 05/31/2014 1920   KETONESUR 20 (A) 07/18/2020 1650   PROTEINUR NEGATIVE 07/18/2020 1650   NITRITE NEGATIVE 07/18/2020 1650   LEUKOCYTESUR TRACE (A) 07/18/2020 1650   LEUKOCYTESUR Negative 05/31/2014 1920   Sepsis Labs: Invalid input(s): PROCALCITONIN, [place]  Microbiology: Recent Results (from the past 240 hour(s))  SARS Coronavirus 2 by RT PCR (hospital order, performed in [facility] hospital lab) Nasopharyngeal Nasopharyngeal Swab     Status: None   Collection Time: 07/18/20  6:28 PM   Specimen: Nasopharyngeal Swab  Result Value Ref Range Status   SARS Coronavirus 2 NEGATIVE NEGATIVE Final    Comment: (NOTE) SARS-CoV-2 target nucleic acids are NOT DETECTED.  The SARS-CoV-2 RNA is generally detectable in upper and lower respiratory specimens during the acute phase of infection. The lowest concentration of SARS-CoV-2 viral copies this assay can  detect is 250 copies / mL. A negative result does not preclude SARS-CoV-2 infection and should not be used as the sole basis for treatment or other patient management decisions.  A negative result may occur with improper specimen collection / handling, submission of specimen other than nasopharyngeal swab, presence of viral mutation(s) within the areas targeted by this assay, and inadequate number of viral copies (<250 copies / mL). A negative result must be combined with clinical observations, patient history, and epidemiological information.  Fact Sheet for Patients:   StrictlyIdeas.no  Fact Sheet for Healthcare Providers: BankingDealers.co.za  This test is not yet approved or  cleared by the [place] FDA and has been authorized for detection and/or diagnosis of SARS-CoV-2 by FDA under an Emergency Use Authorization (EUA).  This EUA will remain in effect (meaning this test can be used) for the duration of the COVID-19 declaration under Section 564(b)(1) of the Act, 21 U.S.C. section 360bbb-3(b)(1), unless the authorization is terminated or revoked sooner.  Performed at [facility], [address] [place], [place] [address]     Radiology Studies: ECHOCARDIOGRAM COMPLETE  Result Date: 07/19/2020    ECHOCARDIOGRAM REPORT   Patient Name:   Jasmine Davidson Date of Exam: 07/19/2020 Medical Rec #:  [id number]       Height:       66.0 in Accession #:    [id number]      Weight:       231.0 lb Date of Birth:  Jul 16, 1956       BSA:          2.126 m Patient Age:    13 years        BP:           144/64 mmHg Patient Gender: F               HR:           71 bpm. Exam Location:  [facility] Procedure: Cardiac Doppler and Color Doppler Indications:     Syncope 780.2  History:         Patient has no prior history of Echocardiogram examinations.                  CAD; Risk Factors:Hypertension and Diabetes.  Sonographer:     Sherrie Sport RDCS (AE)  Referring Phys:  [id number] Cleaster Corin PATEL Diagnosing Phys: Skeet Latch MD IMPRESSIONS  1. Left ventricular ejection fraction, by estimation, is 65 to 70%. The left ventricle has normal function. The left ventricle has no regional wall motion abnormalities. There is mild concentric left ventricular hypertrophy. Left ventricular diastolic parameters are consistent with Grade I diastolic dysfunction (impaired relaxation).  2. Right ventricular systolic function is normal. The right ventricular size is normal.  3. Left atrial size was moderately dilated.  4. The mitral valve is normal in structure. No evidence of mitral valve regurgitation. No evidence of mitral stenosis.  5. The aortic valve was not well visualized. Aortic valve regurgitation is not visualized. No aortic stenosis is present. FINDINGS  Left Ventricle: Left ventricular ejection fraction, by estimation, is 65 to 70%. The left ventricle has normal function. The left ventricle has no regional wall motion abnormalities. The left ventricular internal cavity size was normal in size. There is  mild concentric left ventricular hypertrophy. Left ventricular diastolic parameters are consistent with Grade I diastolic dysfunction (impaired relaxation). Indeterminate filling pressures. Right Ventricle: The right ventricular size is normal. No increase in right ventricular wall thickness. Right ventricular systolic function is normal. Left Atrium: Left atrial size was moderately dilated. Right Atrium: Right atrial size was normal in size. Pericardium: There is no evidence of pericardial effusion. Mitral Valve: The mitral valve is normal in structure. Normal mobility of the mitral valve leaflets. No evidence of mitral valve regurgitation. No evidence of mitral valve stenosis. Tricuspid Valve: The tricuspid valve is normal in structure. Tricuspid valve regurgitation is trivial. No evidence of tricuspid stenosis. Aortic Valve: The aortic valve was not well visualized.  Aortic valve regurgitation is not visualized. No aortic stenosis is present. Aortic valve mean gradient measures 3.0 mmHg. Aortic valve peak gradient measures 5.0 mmHg. Aortic valve area, by VTI measures 2.74 cm. Pulmonic Valve: The pulmonic valve was normal in structure. Pulmonic valve regurgitation is not visualized. No evidence of pulmonic stenosis. Aorta: The aortic root is normal in size and structure. IAS/Shunts: No atrial level shunt detected by color flow Doppler.  LEFT VENTRICLE PLAX 2D LVIDd:         4.13 cm  Diastology LVIDs:         2.34 cm  LV e' lateral:   9.14 cm/s LV PW:         1.18 cm  LV E/e' lateral: 8.3 LV IVS:        0.88 cm  LV e' medial:    6.31 cm/s LVOT diam:     2.00 cm  LV E/e' medial:  12.1 LV SV:         72 LV SV Index:   34 LVOT Area:     3.14 cm  RIGHT VENTRICLE RV Basal diam:  3.09 cm RV S prime:     15.40 cm/s TAPSE (M-mode): 3.8 cm LEFT ATRIUM             Index       RIGHT ATRIUM           Index LA diam:        4.70 cm 2.21 cm/m  RA Area:     18.90 cm LA Vol (A2C):   79.6 ml 37.44 ml/m RA Volume:   49.50 ml  23.28 ml/m LA Vol (A4C):   63.0 ml 29.63 ml/m LA Biplane Vol: 71.8 ml 33.77 ml/m  AORTIC VALVE  PULMONIC VALVE AV Area (Vmax):    2.40 cm    PV Vmax:        0.61 m/s AV Area (Vmean):   2.44 cm    PV Peak grad:   1.5 mmHg AV Area (VTI):     2.74 cm    RVOT Peak grad: 2 mmHg AV Vmax:           112.00 cm/s AV Vmean:          78.900 cm/s AV VTI:            0.263 m AV Peak Grad:      5.0 mmHg AV Mean Grad:      3.0 mmHg LVOT Vmax:         85.50 cm/s LVOT Vmean:        61.300 cm/s LVOT VTI:          0.229 m LVOT/AV VTI ratio: 0.87  AORTA Ao Root diam: 2.90 cm MITRAL VALVE               TRICUSPID VALVE MV Area (PHT): 3.50 cm    TR Peak grad:   18.7 mmHg MV Decel Time: 217 msec    TR Vmax:        216.00 cm/s MV E velocity: 76.30 cm/s MV A velocity: 81.40 cm/s  SHUNTS MV E/A ratio:  0.94        Systemic VTI:  0.23 m                            Systemic Diam:  2.00 cm Skeet Latch MD Electronically signed by Skeet Latch MD Signature Date/Time: 07/19/2020/1:12:02 PM    Final      Madhav Mohon T. [place]  If 7PM-7AM, please contact night-coverage [email or web address] Password [email or web address] 07/19/2020, 4:55 PM

## 2020-07-19 NOTE — Progress Notes (Signed)
*  PRELIMINARY RESULTS* Echocardiogram 2D Echocardiogram has been performed.  Sherrie Sport 07/19/2020, 10:35 AM

## 2020-07-19 NOTE — ED Notes (Addendum)
Echo at bedside, unable to give meds at this time. Pt husband upset that breakfast tray had not arrived yet, pt was offered crackers and peanut butter. Pt husband remained upset and slightly verbally aggressive with RN. Pt and husband informed dietary contacted for tray around 30 min ago for missing tray to be sent up. Dietary contacted a second time by this RN to ensure pt breakfast tray is being sent up. Dietary reports they will send tray immediately

## 2020-07-19 NOTE — ED Notes (Signed)
Resting quietly with eyes closed.

## 2020-07-19 NOTE — Consult Note (Addendum)
ANTICOAGULATION CONSULT NOTE - Initial Consult  Pharmacy Consult for Heparin Infusion Dosing and Monitoring  Indication: chest pain/ACS  Allergies  Allergen Reactions  . Pregabalin     Patient Measurements: Height: 5\' 6"  (167.6 cm) Weight: 104.8 kg (231 lb) IBW/kg (Calculated) : 59.3 Heparin Dosing Weight: 83.3 kg  Vital Signs: BP: 115/61 (08/08 0000) Pulse Rate: 70 (08/08 0000)  Labs: Recent Labs    07/18/20 1441 07/18/20 1441 07/18/20 1650 07/18/20 1946 07/18/20 2123 07/19/20 0208  HGB 13.0  --   --   --   --  12.5  HCT 39.2  --   --   --   --  37.6  PLT 149*  --   --   --   --  155  APTT 26  --   --   --   --   --   LABPROT 12.2  --   --   --   --   --   INR 0.9  --   --   --   --   --   HEPARINUNFRC  --   --   --   --   --  0.76*  CREATININE 1.30*  --   --   --   --  1.27*  TROPONINIHS 23*   < > 68* 70* 53*  --    < > = values in this interval not displayed.    Estimated Creatinine Clearance: 55.5 mL/min (A) (by C-G formula based on SCr of 1.27 mg/dL (H)).   Medical History: Past Medical History:  Diagnosis Date  . CAD (coronary artery disease)   . CKD (chronic kidney disease) stage 3, GFR 30-59 ml/min   . Diabetes mellitus   . HTN (hypertension)   . Obesity     Assessment: Pharmacy consulted for heparin infusion dosing and monitoring for 64 yo female for ACS/STEMI.  Troponin HS: 23>> 68  Hgb: 13  Hct: 39  Plts: 149    08/08 @ 0242 HL 0.76. Slightly supratherapeutic. CBC appropriate.  Goal of Therapy:  Heparin level 0.3-0.7 units/ml Monitor platelets by anticoagulation protocol: Yes   Plan:  Baseline labs ordered Dosing weight: 83.3kg  Will reduce heparin infusion to 950 units/hr. Update @0345 : Rate has not been changed; called nurse @ 0345 about rate change  Check anti-Xa level in 6 hours and daily while on heparin Continue to monitor H&H and platelets  Rowland Lathe, PharmD Clinical Pharmacist 07/19/2020 3:12 AM

## 2020-07-20 LAB — GLUCOSE, CAPILLARY
Glucose-Capillary: 133 mg/dL — ABNORMAL HIGH (ref 70–99)
Glucose-Capillary: 152 mg/dL — ABNORMAL HIGH (ref 70–99)
Glucose-Capillary: 191 mg/dL — ABNORMAL HIGH (ref 70–99)
Glucose-Capillary: 138 mg/dL — ABNORMAL HIGH (ref 70–99)
Glucose-Capillary: 224 mg/dL — ABNORMAL HIGH (ref 70–99)

## 2020-07-20 LAB — MAGNESIUM: Magnesium: 2.1 mg/dL (ref 1.7–2.4)

## 2020-07-20 LAB — CBC
HCT: 36.8 % (ref 36.0–46.0)
Hemoglobin: 12.2 g/dL (ref 12.0–15.0)
MCH: 31.4 pg (ref 26.0–34.0)
MCHC: 33.2 g/dL (ref 30.0–36.0)
MCV: 94.6 fL (ref 80.0–100.0)
Platelets: 152 10*3/uL (ref 150–400)
RBC: 3.89 MIL/uL (ref 3.87–5.11)
RDW: 12.6 % (ref 11.5–15.5)
WBC: 5.5 10*3/uL (ref 4.0–10.5)
nRBC: 0 % (ref 0.0–0.2)

## 2020-07-20 LAB — RENAL FUNCTION PANEL
Albumin: 3.3 g/dL — ABNORMAL LOW (ref 3.5–5.0)
Anion gap: 10 (ref 5–15)
BUN: 27 mg/dL — ABNORMAL HIGH (ref 8–23)
CO2: 23 mmol/L (ref 22–32)
Calcium: 8.7 mg/dL — ABNORMAL LOW (ref 8.9–10.3)
Chloride: 106 mmol/L (ref 98–111)
Creatinine, Ser: 1.21 mg/dL — ABNORMAL HIGH (ref 0.44–1.00)
GFR calc Af Amer: 55 mL/min — ABNORMAL LOW (ref >60)
GFR calc non Af Amer: 48 mL/min — ABNORMAL LOW (ref >60)
Glucose, Bld: 140 mg/dL — ABNORMAL HIGH (ref 70–99)
Phosphorus: 3.7 mg/dL (ref 2.5–4.6)
Potassium: 4.1 mmol/L (ref 3.5–5.1)
Sodium: 139 mmol/L (ref 135–145)

## 2020-07-20 LAB — LIPID PANEL
Cholesterol: 108 mg/dL (ref 0–200)
HDL: 28 mg/dL — ABNORMAL LOW (ref >40)
LDL Cholesterol: 62 mg/dL (ref 0–99)
Total CHOL/HDL Ratio: 3.9 RATIO
Triglycerides: 88 mg/dL (ref <150)
VLDL: 18 mg/dL (ref 0–40)

## 2020-07-20 LAB — HEPARIN LEVEL (UNFRACTIONATED): Heparin Unfractionated: 0.45 IU/mL (ref 0.30–0.70)

## 2020-07-20 MED ORDER — VERAPAMIL HCL ER 120 MG PO TBCR
120.0000 mg | EXTENDED_RELEASE_TABLET | Freq: Every day | ORAL | Status: DC
  Administered 2020-07-20: 120 mg via ORAL
  Filled 2020-07-20 (×3): qty 1

## 2020-07-20 NOTE — Consult Note (Signed)
ANTICOAGULATION CONSULT NOTE  Pharmacy Consult for Heparin Infusion Dosing and Monitoring  Indication: chest pain/ACS  Patient Measurements: Height: 5\' 6"  (167.6 cm) Weight: 104.8 kg (231 lb) IBW/kg (Calculated) : 59.3 Heparin Dosing Weight: 83.3 kg  Vital Signs: Temp: 97.7 F (36.5 C) (08/09 0042) Temp Source: Oral (08/09 0042) BP: 126/53 (08/09 0042) Pulse Rate: 65 (08/09 0042)  Labs: Recent Labs    07/18/20 1441 07/18/20 1441 07/18/20 1650 07/18/20 1946 07/18/20 2123 07/19/20 0208 07/19/20 1205 07/19/20 2236  HGB 13.0  --   --   --   --  12.5  --   --   HCT 39.2  --   --   --   --  37.6  --   --   PLT 149*  --   --   --   --  155  --   --   APTT 26  --   --   --   --   --   --   --   LABPROT 12.2  --   --   --   --   --   --   --   INR 0.9  --   --   --   --   --   --   --   HEPARINUNFRC  --   --   --   --   --  0.76* 0.76* 0.50  CREATININE 1.30*  --   --   --   --  1.27*  --   --   TROPONINIHS 23*   < > 68* 70* 53*  --   --   --    < > = values in this interval not displayed.    Estimated Creatinine Clearance: 55.5 mL/min (A) (by C-G formula based on SCr of 1.27 mg/dL (H)).   Medical History: Past Medical History:  Diagnosis Date  . CAD (coronary artery disease)   . CKD (chronic kidney disease) stage 3, GFR 30-59 ml/min   . Diabetes mellitus   . HTN (hypertension)   . Obesity     Assessment: Pharmacy consulted for heparin infusion dosing and monitoring for 64 yo female for ACS/STEMI.  Troponin HS now trending down, H&H, platelets are stable. Chronic anticoagulation prior to admission includes only aspirin.   Heparin Course: 08/07 initiation: 4000 unit bolus, then 1000 units/hr 08/08 0208 HL 0.76: reduce to 950 units/hr 08/08 1205 HL 0.76: reduce to 850 units/hr  Goal of Therapy:  Heparin level 0.3-0.7 units/ml Monitor platelets by anticoagulation protocol: Yes   Plan:  08/08 @ 2200 HL 0.50 therapeutic. Will continue current rate and will recheck  HL at 0400 and continue to monitor.  Tobie Lords, PharmD Clinical Pharmacist 07/20/2020 12:52 AM

## 2020-07-20 NOTE — Progress Notes (Signed)
PROGRESS NOTE  WM FRUCHTER GYK:[id number] DOB: June 25, 1956   PCP: Theotis Burrow, MD  Patient is from: Home.  DOA: 07/18/2020 LOS: 1  Brief Narrative / Interim history: 64 year old female with PMH of CAD with 50% mid LAD stenosis based on card in 2015 at [facility], IDDM-2 with retinopathy and neuropathy, CKD-3A, HTN, HLD and chronic venous sufficiency presenting after near syncope associated with diaphoresis, nausea and emesis. She also has some exertional dyspnea and chest pain intermittently. She reports some abnormal heart rhythm in the past but never been told if she had A. fib or a flutter.  In ED, hemodynamically stable. 100% on RA. CBC, CMP, UA and CXR without significant finding. Sensitivity troponin 23> 68. EKG NSR without acute ischemic finding abnormal interval. COVID-19 PCR negative. Started on IV heparin. She was admitted for evaluation for near syncope and non-STEMI.   Plan for LHC on 8/10.  Patient has been chest pain-free since admission.  Subjective: Seen and examined earlier this morning.  No major events overnight of this morning.  No complaints.  She denies chest pain, palpitation, shortness of breath, GI or UTI symptoms.  Did not feel lightheaded when she got up to go to the bathroom.  Objective: Vitals:   07/20/20 0042 07/20/20 0500 07/20/20 0516 07/20/20 0715  BP: (!) 126/53  (!) 121/56 (!) 152/66  Pulse: 65  64 69  Resp: 16  16   Temp: 97.7 F (36.5 C)  97.9 F (36.6 C) (!) 97.5 F (36.4 C)  TempSrc: Oral  Oral Oral  SpO2: 97%  96% 99%  Weight:  107.2 kg    Height:        Intake/Output Summary (Last 24 hours) at 07/20/2020 1036 Last data filed at 07/20/2020 0514 Gross per 24 hour  Intake 342.34 ml  Output --  Net 342.34 ml   Filed Weights   07/18/20 1451 07/20/20 0500  Weight: 104.8 kg 107.2 kg    Examination:  GENERAL: No apparent distress.  Nontoxic. HEENT: MMM.  Vision and hearing grossly intact.  NECK: Supple.  No apparent JVD.  RESP:   No IWOB.  Fair aeration bilaterally. CVS:  RRR. Heart sounds normal.  ABD/GI/GU: BS+. Abd soft, NTND.  MSK/EXT:  Moves extremities. No apparent deformity. No edema.  SKIN: no apparent skin lesion or wound NEURO: Awake, alert and oriented appropriately.  No apparent focal neuro deficit. PSYCH: Calm. Normal affect.  Procedures:  None  Microbiology summarized: COVID-19 PCR negative.  Assessment & Plan: NSTEMI/history of CAD: LHC in 08/2014 at [facility] with 50% mid LAD stenosis. She reports exertional dyspnea and chest discomfort intermittently. Currently chest pain-free. Mild elevated troponin with delta. EKG without acute ischemic finding. Echo with EF of 65 to 70%, mild concentric LVH, G1 DD, moderate LAE. Family history of CHF in her father in his 66s. Significant risk factors.  LDL 62. A1c 5.8 -Continue metoprolol, atorvastatin and aspirin. -Cardiology consulted.  LHC postponed to 8/10 -Continue IV heparin for now  Near-syncope: Vasovagal syncope? Mild elevated troponin with delta. EKG without acute ischemic finding. Currently asymptomatic.  TSH within normal. -Orthostatic vitals-has not been done yet.  Requested RN -PT eval   Controlled DM-2 with hyperglycemia and CKD-3A: A1c 5.8%. On Metformin, Jardiance, Victoza and 70/30 insulin at home. Recent Labs  Lab 07/19/20 0954 07/19/20 1647 07/19/20 2142 07/20/20 0514 07/20/20 0713  GLUCAP 130* 146* 194* 133* 152*  -Continue CBG monitoring and SSI insulin here -Continue statin  CKD stage IIIA: Stable. -Continue monitoring  Essential hypertension: Normotensive for most part.  On lisinopril, HCTZ, metoprolol and verapamil.  -Continue home lisinopril and metoprolol -Add as needed labetalol. -Continue holding HCTZ and verapamil  At risk for sleep apnea -would benefit from outpatient sleep study  Hyperlipidemia: -Continue atorvastatin.   Body mass index is 38.16 kg/m.         DVT prophylaxis: On full dose heparin     Code Status: Full code Family Communication: Updated patient's husband at bedside. Status is: Observation  The patient will require care spanning > 2 midnights and should be moved to inpatient because: Ongoing diagnostic testing needed not appropriate for outpatient work up, IV treatments appropriate due to intensity of illness or inability to take PO and Inpatient level of care appropriate due to severity of illness  Dispo: The patient is from: Home              Anticipated d/c is to: Home              Anticipated d/c date is: 2 days              Patient currently is not medically stable to d/c.       Consultants:  Cardiology   Sch Meds:  Scheduled Meds: . aspirin EC  81 mg Oral Daily  . atorvastatin  40 mg Oral QHS  . carvedilol  12.5 mg Oral BID WC  . gabapentin  300 mg Oral TID  . insulin aspart  0-9 Units Subcutaneous TID WC  . lisinopril  20 mg Oral Daily  . sodium chloride flush  3 mL Intravenous Q12H  . verapamil  120 mg Oral Daily   Continuous Infusions: . heparin 850 Units/hr (07/20/20 0514)   PRN Meds:.acetaminophen **OR** acetaminophen, labetalol, ondansetron **OR** ondansetron (ZOFRAN) IV  Antimicrobials: Anti-infectives (From admission, onward)   None       I have personally reviewed the following labs and images: CBC: Recent Labs  Lab 07/18/20 1441 07/19/20 0208 07/20/20 0434  WBC 6.2 4.0 5.5  NEUTROABS 4.8  --   --   HGB 13.0 12.5 12.2  HCT 39.2 37.6 36.8  MCV 93.6 93.3 94.6  PLT 149* 155 152   BMP &GFR Recent Labs  Lab 07/18/20 1441 07/18/20 1946 07/19/20 0208 07/20/20 0434  NA 136  --  138 139  K 4.3  --  4.1 4.1  CL 106  --  105 106  CO2 20*  --  25 23  GLUCOSE 164*  --  146* 140*  BUN 28*  --  30* 27*  CREATININE 1.30*  --  1.27* 1.21*  CALCIUM 8.3*  --  8.8* 8.7*  MG  --  1.8  --  2.1  PHOS  --   --   --  3.7   Estimated Creatinine Clearance: 59 mL/min (A) (by C-G formula based on SCr of 1.21 mg/dL (H)). Liver &  Pancreas: Recent Labs  Lab 07/18/20 1441 07/20/20 0434  AST 15  --   ALT 13  --   ALKPHOS 72  --   BILITOT 1.0  --   PROT 6.2*  --   ALBUMIN 3.7 3.3*   No results for input(s): LIPASE, AMYLASE in the last 168 hours. No results for input(s): AMMONIA in the last 168 hours. Diabetic: Recent Labs    07/18/20 1946  HGBA1C 5.8*   Recent Labs  Lab 07/19/20 0954 07/19/20 1647 07/19/20 2142 07/20/20 0514 07/20/20 0713  GLUCAP 130* 146* 194* 133* 152*  Cardiac Enzymes: No results for input(s): CKTOTAL, CKMB, CKMBINDEX, TROPONINI in the last 168 hours. No results for input(s): PROBNP in the last 8760 hours. Coagulation Profile: Recent Labs  Lab 07/18/20 1441  INR 0.9   Thyroid Function Tests: Recent Labs    07/19/20 0208  TSH 1.275   Lipid Profile: Recent Labs    07/20/20 0434  CHOL 108  HDL 28*  LDLCALC 62  TRIG 88  CHOLHDL 3.9   Anemia Panel: No results for input(s): VITAMINB12, FOLATE, FERRITIN, TIBC, IRON, RETICCTPCT in the last 72 hours. Urine analysis:    Component Value Date/Time   COLORURINE YELLOW (A) 07/18/2020 1650   APPEARANCEUR CLEAR (A) 07/18/2020 1650   APPEARANCEUR Clear 05/31/2014 1920   LABSPEC 1.013 07/18/2020 1650   LABSPEC 1.004 05/31/2014 1920   PHURINE 7.0 07/18/2020 1650   GLUCOSEU >=500 (A) 07/18/2020 1650   GLUCOSEU 50 mg/dL 05/31/2014 1920   HGBUR NEGATIVE 07/18/2020 1650   BILIRUBINUR NEGATIVE 07/18/2020 1650   BILIRUBINUR Negative 05/31/2014 1920   KETONESUR 20 (A) 07/18/2020 1650   PROTEINUR NEGATIVE 07/18/2020 1650   NITRITE NEGATIVE 07/18/2020 1650   LEUKOCYTESUR TRACE (A) 07/18/2020 1650   LEUKOCYTESUR Negative 05/31/2014 1920   Sepsis Labs: Invalid input(s): PROCALCITONIN, [place]  Microbiology: Recent Results (from the past 240 hour(s))  SARS Coronavirus 2 by RT PCR (hospital order, performed in [facility] hospital lab) Nasopharyngeal Nasopharyngeal Swab     Status: None   Collection Time: 07/18/20  6:28  PM   Specimen: Nasopharyngeal Swab  Result Value Ref Range Status   SARS Coronavirus 2 NEGATIVE NEGATIVE Final    Comment: (NOTE) SARS-CoV-2 target nucleic acids are NOT DETECTED.  The SARS-CoV-2 RNA is generally detectable in upper and lower respiratory specimens during the acute phase of infection. The lowest concentration of SARS-CoV-2 viral copies this assay can detect is 250 copies / mL. A negative result does not preclude SARS-CoV-2 infection and should not be used as the sole basis for treatment or other patient management decisions.  A negative result may occur with improper specimen collection / handling, submission of specimen other than nasopharyngeal swab, presence of viral mutation(s) within the areas targeted by this assay, and inadequate number of viral copies (<250 copies / mL). A negative result must be combined with clinical observations, patient history, and epidemiological information.  Fact Sheet for Patients:   StrictlyIdeas.no  Fact Sheet for Healthcare Providers: BankingDealers.co.za  This test is not yet approved or  cleared by the [place] FDA and has been authorized for detection and/or diagnosis of SARS-CoV-2 by FDA under an Emergency Use Authorization (EUA).  This EUA will remain in effect (meaning this test can be used) for the duration of the COVID-19 declaration under Section 564(b)(1) of the Act, 21 U.S.C. section 360bbb-3(b)(1), unless the authorization is terminated or revoked sooner.  Performed at [facility], [address] [place], [place] [address]     Radiology Studies: No results found.   Kandas Oliveto T. [place]  If 7PM-7AM, please contact night-coverage [email or web address] Password [email or web address] 07/20/2020, 10:36 AM

## 2020-07-20 NOTE — TOC Initial Note (Signed)
Transition of Care [facility]) - Initial/Assessment Note    Patient Details  Name: Jasmine Davidson MRN: [id number] Date of Birth: Jun 18, 1956  Transition of Care [facility]) CM/SW Contact:    Shelbie Hutching, RN Phone Number: 07/20/2020, 10:30 AM  Clinical Narrative:                 Patient admitted to the hospital after a near syncopal episode and found to have elevated troponin's.  Patient has a history of CAD, type 2 diabetes, and chronic kidney disease stage 3.  Patient is from home where she lives with her husband, she is independent in ADL's, patient has a walker but does not need to use it.  Patient drives and is current with her PCP, Dr. Ladoris Gene at [facility], patient also gets her prescriptions from there.  Patient does not want home health services set up at this time, she does not think she will need them.  Patient is scheduled to have a cardiac cath this afternoon.   Expected Discharge Plan: Home/Self Care Barriers to Discharge: Continued Medical Work up   Patient Goals and CMS Choice Patient states their goals for this hospitalization and ongoing recovery are:: To get home   Choice offered to / list presented to : NA  Expected Discharge Plan and Services Expected Discharge Plan: Home/Self Care   Discharge Planning Services: CM Consult Post Acute Care Choice: NA Living arrangements for the past 2 months: Single Family Home                           HH Arranged: Refused HH          Prior Living Arrangements/Services Living arrangements for the past 2 months: Single Family Home Lives with:: Spouse Patient language and need for interpreter reviewed:: Yes Do you feel safe going back to the place where you live?: Yes      Need for Family Participation in Patient Care: Yes (Comment) (CAD, chronic kidney disease, diabetes type 2.) Care giver support system in place?: Yes (comment) Current home services: DME (walker) Criminal Activity/Legal Involvement  Pertinent to Current Situation/Hospitalization: No - Comment as needed  Activities of Daily Living Home Assistive Devices/Equipment: Walker (specify type) ADL Screening (condition at time of admission) Patient's cognitive ability adequate to safely complete daily activities?: Yes Is the patient deaf or have difficulty hearing?: No Does the patient have difficulty seeing, even when wearing glasses/contacts?: No Does the patient have difficulty concentrating, remembering, or making decisions?: No Patient able to express need for assistance with ADLs?: Yes Does the patient have difficulty dressing or bathing?: No Independently performs ADLs?: Yes (appropriate for developmental age) Does the patient have difficulty walking or climbing stairs?: Yes Weakness of Legs: Both Weakness of Arms/Hands: None  Permission Sought/Granted Permission sought to share information with : Case Manager, Family Supports Permission granted to share information with : Yes, Verbal Permission Granted  Share Information with NAME: [name]     Permission granted to share info w Relationship: husband     Emotional Assessment Appearance:: Appears stated age Attitude/Demeanor/Rapport: Engaged Affect (typically observed): Accepting Orientation: : Oriented to Self, Oriented to Place, Oriented to  Time, Oriented to Situation Alcohol / Substance Use: Not Applicable Psych Involvement: No (comment)  Admission diagnosis:  Elevated troponin [R77.8] Near syncope [R55] NSTEMI (non-ST elevated myocardial infarction) [facility]) [I21.4] Patient Active Problem List   Diagnosis Date Noted  . NSTEMI (non-ST elevated myocardial infarction) ([place]) 07/19/2020  .  Non-ST elevation (NSTEMI) myocardial infarction ([place])   . Snoring   . Near syncope 07/18/2020  . Hyperlipidemia associated with type 2 diabetes mellitus ([place]) 07/18/2020  . CKD (chronic kidney disease) stage 3, GFR 30-59 ml/min 07/18/2020  . Elevated troponin 07/18/2020  .  Acute renal failure superimposed on stage 3 chronic kidney disease ([place]) 04/13/2020  . Hyperkalemia   . Cellulitis 08/08/2017  . Insulin-requiring or dependent type II diabetes mellitus ([place]) 11/11/2010  . HYPERLIPIDEMIA-MIXED 11/11/2010  . Hypertension associated with diabetes ([place]) 11/11/2010  . CAD, NATIVE VESSEL 11/11/2010  . 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 CAROTID ARTERY STENOSIS, WITHOUT INFARCTION 11/11/2010  . SYNCOPE AND COLLAPSE 11/11/2010   PCP:  Theotis Burrow, MD Pharmacy:   [place], [place] - [address] [place] [address] Phone: [phone] Fax: [phone]     Social Determinants of Health (SDOH) Interventions    Readmission Risk Interventions Readmission Risk Prevention Plan 07/20/2020  Transportation Screening Complete  PCP or Specialist Appt within 5-7 Days Complete  Home Care Screening Patient refused  Medication Review (RN CM) Complete  Some recent data might be hidden

## 2020-07-20 NOTE — Progress Notes (Signed)
Progress Note  Patient Name: Jasmine Davidson Date of Encounter: 07/20/2020  Primary Cardiologist: [facility]  Subjective   No further chest pain, dyspnea, or presyncope. Possible LHC today.   Inpatient Medications    Scheduled Meds: . aspirin EC  81 mg Oral Daily  . atorvastatin  40 mg Oral QHS  . carvedilol  12.5 mg Oral BID WC  . gabapentin  300 mg Oral TID  . insulin aspart  0-9 Units Subcutaneous TID WC  . lisinopril  20 mg Oral Daily  . sodium chloride flush  3 mL Intravenous Q12H   Continuous Infusions: . heparin 850 Units/hr (07/20/20 0514)   PRN Meds: acetaminophen **OR** acetaminophen, labetalol, ondansetron **OR** ondansetron (ZOFRAN) IV   Vital Signs    Vitals:   07/20/20 0042 07/20/20 0500 07/20/20 0516 07/20/20 0715  BP: (!) 126/53  (!) 121/56 (!) 152/66  Pulse: 65  64 69  Resp: 16  16   Temp: 97.7 F (36.5 C)  97.9 F (36.6 C) (!) 97.5 F (36.4 C)  TempSrc: Oral  Oral Oral  SpO2: 97%  96% 99%  Weight:  107.2 kg    Height:        Intake/Output Summary (Last 24 hours) at 07/20/2020 0936 Last data filed at 07/20/2020 0514 Gross per 24 hour  Intake 342.34 ml  Output --  Net 342.34 ml   Filed Weights   07/18/20 1451 07/20/20 0500  Weight: 104.8 kg 107.2 kg    Telemetry    SR - Personally Reviewed  ECG    No new tracings - Personally Reviewed  Physical Exam   GEN: No acute distress.   Neck: No JVD. Cardiac: RRR, no murmurs, rubs, or gallops.  Respiratory: Clear to auscultation bilaterally.  GI: Soft, nontender, non-distended.   MS: No edema; No deformity. Neuro:  Alert and oriented x 3; Nonfocal.  Psych: Normal affect.  Labs    Chemistry Recent Labs  Lab 07/18/20 1441 07/19/20 0208 07/20/20 0434  NA 136 138 139  K 4.3 4.1 4.1  CL 106 105 106  CO2 20* 25 23  GLUCOSE 164* 146* 140*  BUN 28* 30* 27*  CREATININE 1.30* 1.27* 1.21*  CALCIUM 8.3* 8.8* 8.7*  PROT 6.2*  --   --   ALBUMIN 3.7  --  3.3*  AST 15  --   --   ALT 13  --    --   ALKPHOS 72  --   --   BILITOT 1.0  --   --   GFRNONAA 44* 45* 48*  GFRAA 51* 52* 55*  ANIONGAP 10 8 10      Hematology Recent Labs  Lab 07/18/20 1441 07/19/20 0208 07/20/20 0434  WBC 6.2 4.0 5.5  RBC 4.19 4.03 3.89  HGB 13.0 12.5 12.2  HCT 39.2 37.6 36.8  MCV 93.6 93.3 94.6  MCH 31.0 31.0 31.4  MCHC 33.2 33.2 33.2  RDW 12.4 12.5 12.6  PLT 149* 155 152    Cardiac EnzymesNo results for input(s): TROPONINI in the last 168 hours. No results for input(s): TROPIPOC in the last 168 hours.   BNPNo results for input(s): BNP, PROBNP in the last 168 hours.   DDimer No results for input(s): DDIMER in the last 168 hours.   Radiology    DG Chest Portable 1 View  Result Date: 07/18/2020 IMPRESSION: No active disease. Electronically Signed   By: Rolm Baptise M.D.   On: 07/18/2020 15:31    Cardiac Studies  2D echo 07/19/2020: 1. Left ventricular ejection fraction, by estimation, is 65 to 70%. The  left ventricle has normal function. The left ventricle has no regional  wall motion abnormalities. There is mild concentric left ventricular  hypertrophy. Left ventricular diastolic  parameters are consistent with Grade I diastolic dysfunction (impaired  relaxation).  2. Right ventricular systolic function is normal. The right ventricular  size is normal.  3. Left atrial size was moderately dilated.  4. The mitral valve is normal in structure. No evidence of mitral valve  regurgitation. No evidence of mitral stenosis.  5. The aortic valve was not well visualized. Aortic valve regurgitation  is not visualized. No aortic stenosis is present.  Patient Profile     64 y.o. female with history of nonobstructive CAD by LHC at [facility] in 2015 with 50% mid LAD stenosis not thought to be hemodynamically significant at that time, CKD stage III, COVID-19 in 11/2019, HTN, HLD, and venous insufficiency who we are seeing for near syncope.   Assessment & Plan    1.  Presyncope/CAD/NSTEMI: -Currently, chest pain free -HS-Tn peaked at 70 and is down trending -ASA -Lipitor -Heparin gtt -Planning for [facility] with exertional chest tightness and known LAD disease -NPO -LHC may get postponed until 8/10 given cardiac cath lab schedule -Clear liquid diet until 11:30 AM, then NPO -If LHC does get postponed, she can eat today and will be NPO at midnight -Risks and benefits of cardiac catheterization have been discussed with the patient including risks of bleeding, bruising, infection, kidney damage, stroke, heart attack, urgent need for cardiac bypass, injury to a limb, and death. The patient understands these risks and is willing to proceed with the procedure. All questions have been answered and concerns listened to  2. HTN: -Blood pressure is improving overall with recent readings into the 237S sysotlic and most recent reading in the 283T systolic -Continue Coreg in place of metoprolol -HCTZ on hold given history of acute on CKD -Add back PTA verapamil   3. HLD: -LDL 62 this admission -Lipitor  4. Sleep disordered breathing: -Recommend outpatient sleep study  5. CKD stage III: -Stable -Monitor   For questions or updates, please contact [place] Please consult [email or web address] for contact info under Cardiology/STEMI.    Signed, Christell Faith, PA-C [place] Pager: [phone] 07/20/2020, 9:36 AM

## 2020-07-20 NOTE — Consult Note (Signed)
ANTICOAGULATION CONSULT NOTE  Pharmacy Consult for Heparin Infusion Dosing and Monitoring  Indication: chest pain/ACS  Patient Measurements: Height: 5\' 6"  (167.6 cm) Weight: 104.8 kg (231 lb) IBW/kg (Calculated) : 59.3 Heparin Dosing Weight: 83.3 kg  Vital Signs: Temp: 97.9 F (36.6 C) (08/09 0516) Temp Source: Oral (08/09 0516) BP: 121/56 (08/09 0516) Pulse Rate: 64 (08/09 0516)  Labs: Recent Labs    07/18/20 1441 07/18/20 1441 07/18/20 1650 07/18/20 1946 07/18/20 2123 07/19/20 0208 07/19/20 0208 07/19/20 1205 07/19/20 2236 07/20/20 0434  HGB 13.0   < >  --   --   --  12.5  --   --   --  12.2  HCT 39.2  --   --   --   --  37.6  --   --   --  36.8  PLT 149*  --   --   --   --  155  --   --   --  152  APTT 26  --   --   --   --   --   --   --   --   --   LABPROT 12.2  --   --   --   --   --   --   --   --   --   INR 0.9  --   --   --   --   --   --   --   --   --   HEPARINUNFRC  --   --   --   --   --  0.76*   < > 0.76* 0.50 0.45  CREATININE 1.30*  --   --   --   --  1.27*  --   --   --   --   TROPONINIHS 23*   < > 68* 70* 53*  --   --   --   --   --    < > = values in this interval not displayed.    Estimated Creatinine Clearance: 55.5 mL/min (A) (by C-G formula based on SCr of 1.27 mg/dL (H)).   Medical History: Past Medical History:  Diagnosis Date  . CAD (coronary artery disease)   . CKD (chronic kidney disease) stage 3, GFR 30-59 ml/min   . Diabetes mellitus   . HTN (hypertension)   . Obesity     Assessment: Pharmacy consulted for heparin infusion dosing and monitoring for 64 yo female for ACS/STEMI.  Troponin HS now trending down, H&H, platelets are stable. Chronic anticoagulation prior to admission includes only aspirin.   Heparin Course: 08/07 initiation: 4000 unit bolus, then 1000 units/hr 08/08 0208 HL 0.76: reduce to 950 units/hr 08/08 1205 HL 0.76: reduce to 850 units/hr  Goal of Therapy:  Heparin level 0.3-0.7 units/ml Monitor platelets  by anticoagulation protocol: Yes   Plan:  08/09 @ 0400 HL 0.45 therapeutic. Will continue current rate and will recheck HL w/ am labs, CBC stable will continue to monitor.  Tobie Lords, PharmD Clinical Pharmacist 07/20/2020 5:31 AM

## 2020-07-20 NOTE — H&P (View-Only) (Signed)
Progress Note  Patient Name: Jasmine Davidson Date of Encounter: 07/20/2020  Primary Cardiologist: [facility]  Subjective   No further chest pain, dyspnea, or presyncope. Possible LHC today.   Inpatient Medications    Scheduled Meds: . aspirin EC  81 mg Oral Daily  . atorvastatin  40 mg Oral QHS  . carvedilol  12.5 mg Oral BID WC  . gabapentin  300 mg Oral TID  . insulin aspart  0-9 Units Subcutaneous TID WC  . lisinopril  20 mg Oral Daily  . sodium chloride flush  3 mL Intravenous Q12H   Continuous Infusions: . heparin 850 Units/hr (07/20/20 0514)   PRN Meds: acetaminophen **OR** acetaminophen, labetalol, ondansetron **OR** ondansetron (ZOFRAN) IV   Vital Signs    Vitals:   07/20/20 0042 07/20/20 0500 07/20/20 0516 07/20/20 0715  BP: (!) 126/53  (!) 121/56 (!) 152/66  Pulse: 65  64 69  Resp: 16  16   Temp: 97.7 F (36.5 C)  97.9 F (36.6 C) (!) 97.5 F (36.4 C)  TempSrc: Oral  Oral Oral  SpO2: 97%  96% 99%  Weight:  107.2 kg    Height:        Intake/Output Summary (Last 24 hours) at 07/20/2020 0936 Last data filed at 07/20/2020 0514 Gross per 24 hour  Intake 342.34 ml  Output --  Net 342.34 ml   Filed Weights   07/18/20 1451 07/20/20 0500  Weight: 104.8 kg 107.2 kg    Telemetry    SR - Personally Reviewed  ECG    No new tracings - Personally Reviewed  Physical Exam   GEN: No acute distress.   Neck: No JVD. Cardiac: RRR, no murmurs, rubs, or gallops.  Respiratory: Clear to auscultation bilaterally.  GI: Soft, nontender, non-distended.   MS: No edema; No deformity. Neuro:  Alert and oriented x 3; Nonfocal.  Psych: Normal affect.  Labs    Chemistry Recent Labs  Lab 07/18/20 1441 07/19/20 0208 07/20/20 0434  NA 136 138 139  K 4.3 4.1 4.1  CL 106 105 106  CO2 20* 25 23  GLUCOSE 164* 146* 140*  BUN 28* 30* 27*  CREATININE 1.30* 1.27* 1.21*  CALCIUM 8.3* 8.8* 8.7*  PROT 6.2*  --   --   ALBUMIN 3.7  --  3.3*  AST 15  --   --   ALT 13  --    --   ALKPHOS 72  --   --   BILITOT 1.0  --   --   GFRNONAA 44* 45* 48*  GFRAA 51* 52* 55*  ANIONGAP 10 8 10      Hematology Recent Labs  Lab 07/18/20 1441 07/19/20 0208 07/20/20 0434  WBC 6.2 4.0 5.5  RBC 4.19 4.03 3.89  HGB 13.0 12.5 12.2  HCT 39.2 37.6 36.8  MCV 93.6 93.3 94.6  MCH 31.0 31.0 31.4  MCHC 33.2 33.2 33.2  RDW 12.4 12.5 12.6  PLT 149* 155 152    Cardiac EnzymesNo results for input(s): TROPONINI in the last 168 hours. No results for input(s): TROPIPOC in the last 168 hours.   BNPNo results for input(s): BNP, PROBNP in the last 168 hours.   DDimer No results for input(s): DDIMER in the last 168 hours.   Radiology    DG Chest Portable 1 View  Result Date: 07/18/2020 IMPRESSION: No active disease. Electronically Signed   By: Rolm Baptise M.D.   On: 07/18/2020 15:31    Cardiac Studies  2D echo 07/19/2020: 1. Left ventricular ejection fraction, by estimation, is 65 to 70%. The  left ventricle has normal function. The left ventricle has no regional  wall motion abnormalities. There is mild concentric left ventricular  hypertrophy. Left ventricular diastolic  parameters are consistent with Grade I diastolic dysfunction (impaired  relaxation).  2. Right ventricular systolic function is normal. The right ventricular  size is normal.  3. Left atrial size was moderately dilated.  4. The mitral valve is normal in structure. No evidence of mitral valve  regurgitation. No evidence of mitral stenosis.  5. The aortic valve was not well visualized. Aortic valve regurgitation  is not visualized. No aortic stenosis is present.  Patient Profile     64 y.o. female with history of nonobstructive CAD by LHC at [facility] in 2015 with 50% mid LAD stenosis not thought to be hemodynamically significant at that time, CKD stage III, COVID-19 in 11/2019, HTN, HLD, and venous insufficiency who we are seeing for near syncope.   Assessment & Plan    1.  Presyncope/CAD/NSTEMI: -Currently, chest pain free -HS-Tn peaked at 70 and is down trending -ASA -Lipitor -Heparin gtt -Planning for [facility] with exertional chest tightness and known LAD disease -NPO -LHC may get postponed until 8/10 given cardiac cath lab schedule -Clear liquid diet until 11:30 AM, then NPO -If LHC does get postponed, she can eat today and will be NPO at midnight -Risks and benefits of cardiac catheterization have been discussed with the patient including risks of bleeding, bruising, infection, kidney damage, stroke, heart attack, urgent need for cardiac bypass, injury to a limb, and death. The patient understands these risks and is willing to proceed with the procedure. All questions have been answered and concerns listened to  2. HTN: -Blood pressure is improving overall with recent readings into the 478G sysotlic and most recent reading in the 956O systolic -Continue Coreg in place of metoprolol -HCTZ on hold given history of acute on CKD -Add back PTA verapamil   3. HLD: -LDL 62 this admission -Lipitor  4. Sleep disordered breathing: -Recommend outpatient sleep study  5. CKD stage III: -Stable -Monitor   For questions or updates, please contact [place] Please consult [email or web address] for contact info under Cardiology/STEMI.    Signed, Christell Faith, PA-C [place] Pager: [phone] 07/20/2020, 9:36 AM

## 2020-07-21 ENCOUNTER — Ambulatory Visit (INDEPENDENT_AMBULATORY_CARE_PROVIDER_SITE_OTHER): Payer: Medicare HMO

## 2020-07-21 ENCOUNTER — Encounter: Payer: Self-pay | Admitting: Student

## 2020-07-21 ENCOUNTER — Encounter
Admission: EM | Disposition: A | Discharge status: Home / Self Care | Payer: Self-pay | Source: Home / Self Care | Attending: Student

## 2020-07-21 DIAGNOSIS — I9589 Other hypotension: Secondary | ICD-10-CM

## 2020-07-21 DIAGNOSIS — E119 Type 2 diabetes mellitus without complications: Secondary | ICD-10-CM

## 2020-07-21 DIAGNOSIS — Z87898 Personal history of other specified conditions: Secondary | ICD-10-CM | POA: Diagnosis not present

## 2020-07-21 DIAGNOSIS — Z794 Long term (current) use of insulin: Secondary | ICD-10-CM

## 2020-07-21 DIAGNOSIS — E861 Hypovolemia: Secondary | ICD-10-CM

## 2020-07-21 HISTORY — PX: LEFT HEART CATH AND CORONARY ANGIOGRAPHY: CATH118249

## 2020-07-21 LAB — CBC
HCT: 36.3 % (ref 36.0–46.0)
Hemoglobin: 12.3 g/dL (ref 12.0–15.0)
MCH: 31.1 pg (ref 26.0–34.0)
MCHC: 33.9 g/dL (ref 30.0–36.0)
MCV: 91.9 fL (ref 80.0–100.0)
Platelets: 151 10*3/uL (ref 150–400)
RBC: 3.95 MIL/uL (ref 3.87–5.11)
RDW: 12.8 % (ref 11.5–15.5)
WBC: 5.9 10*3/uL (ref 4.0–10.5)
nRBC: 0 % (ref 0.0–0.2)

## 2020-07-21 LAB — RENAL FUNCTION PANEL
Albumin: 3.3 g/dL — ABNORMAL LOW (ref 3.5–5.0)
Anion gap: 11 (ref 5–15)
BUN: 24 mg/dL — ABNORMAL HIGH (ref 8–23)
CO2: 23 mmol/L (ref 22–32)
Calcium: 8.5 mg/dL — ABNORMAL LOW (ref 8.9–10.3)
Chloride: 102 mmol/L (ref 98–111)
Creatinine, Ser: 1.3 mg/dL — ABNORMAL HIGH (ref 0.44–1.00)
GFR calc Af Amer: 51 mL/min — ABNORMAL LOW (ref >60)
GFR calc non Af Amer: 44 mL/min — ABNORMAL LOW (ref >60)
Glucose, Bld: 157 mg/dL — ABNORMAL HIGH (ref 70–99)
Phosphorus: 3.8 mg/dL (ref 2.5–4.6)
Potassium: 3.8 mmol/L (ref 3.5–5.1)
Sodium: 136 mmol/L (ref 135–145)

## 2020-07-21 LAB — GLUCOSE, CAPILLARY
Glucose-Capillary: 150 mg/dL — ABNORMAL HIGH (ref 70–99)
Glucose-Capillary: 181 mg/dL — ABNORMAL HIGH (ref 70–99)
Glucose-Capillary: 132 mg/dL — ABNORMAL HIGH (ref 70–99)
Glucose-Capillary: 138 mg/dL — ABNORMAL HIGH (ref 70–99)

## 2020-07-21 LAB — HEPARIN LEVEL (UNFRACTIONATED): Heparin Unfractionated: 0.28 IU/mL — ABNORMAL LOW (ref 0.30–0.70)

## 2020-07-21 SURGERY — LEFT HEART CATH AND CORONARY ANGIOGRAPHY
Anesthesia: Moderate Sedation

## 2020-07-21 MED ORDER — SODIUM CHLORIDE 0.9% FLUSH: 3.0000 mL | INTRAVENOUS | Status: DC | PRN

## 2020-07-21 MED ORDER — SODIUM CHLORIDE 0.9 % WEIGHT BASED INFUSION: 321.0000 mL/h | INTRAVENOUS | Status: DC

## 2020-07-21 MED ORDER — VERAPAMIL HCL 2.5 MG/ML IV SOLN
INTRAVENOUS | Status: AC
  Filled 2020-07-21: qty 2

## 2020-07-21 MED ORDER — HEPARIN SODIUM (PORCINE) 1000 UNIT/ML IJ SOLN
INTRAMUSCULAR | Status: DC | PRN
  Administered 2020-07-21: 5000 [IU] via INTRAVENOUS

## 2020-07-21 MED ORDER — HEPARIN SODIUM (PORCINE) 1000 UNIT/ML IJ SOLN
INTRAMUSCULAR | Status: AC
  Filled 2020-07-21: qty 1

## 2020-07-21 MED ORDER — FENTANYL CITRATE (PF) 100 MCG/2ML IJ SOLN
INTRAMUSCULAR | Status: AC
  Filled 2020-07-21: qty 2

## 2020-07-21 MED ORDER — LIDOCAINE HCL (PF) 1 % IJ SOLN
INTRAMUSCULAR | Status: DC | PRN
  Administered 2020-07-21: 2 mL

## 2020-07-21 MED ORDER — MIDAZOLAM HCL 2 MG/2ML IJ SOLN
INTRAMUSCULAR | Status: DC | PRN
  Administered 2020-07-21: 1 mg via INTRAVENOUS

## 2020-07-21 MED ORDER — SODIUM CHLORIDE 0.9 % WEIGHT BASED INFUSION
321.0000 mL/h | INTRAVENOUS | Status: DC
  Administered 2020-07-21: 321 mL/h via INTRAVENOUS

## 2020-07-21 MED ORDER — SODIUM CHLORIDE 0.9 % IV SOLN: INTRAVENOUS | Status: AC

## 2020-07-21 MED ORDER — HEPARIN BOLUS VIA INFUSION
1250.0000 [IU] | Freq: Once | INTRAVENOUS | Status: AC
  Administered 2020-07-21: 09:00:00 1250 [IU] via INTRAVENOUS
  Filled 2020-07-21: qty 1250

## 2020-07-21 MED ORDER — SODIUM CHLORIDE 0.9 % IV SOLN: 250.0000 mL | INTRAVENOUS | Status: DC | PRN

## 2020-07-21 MED ORDER — HEPARIN (PORCINE) IN NACL 1000-0.9 UT/500ML-% IV SOLN
INTRAVENOUS | Status: DC | PRN
  Administered 2020-07-21: 500 mL

## 2020-07-21 MED ORDER — ASPIRIN 81 MG PO CHEW: 81.0000 mg | CHEWABLE_TABLET | ORAL | Status: DC

## 2020-07-21 MED ORDER — HYDRALAZINE HCL 20 MG/ML IJ SOLN: 10.0000 mg | INTRAMUSCULAR | Status: AC | PRN

## 2020-07-21 MED ORDER — IOHEXOL 300 MG/ML  SOLN
INTRAMUSCULAR | Status: DC | PRN
  Administered 2020-07-21: 35 mL

## 2020-07-21 MED ORDER — ENOXAPARIN SODIUM 40 MG/0.4ML ~~LOC~~ SOLN
40.0000 mg | SUBCUTANEOUS | Status: DC
  Filled 2020-07-21: qty 0.4

## 2020-07-21 MED ORDER — SODIUM CHLORIDE 0.9% FLUSH: 3.0000 mL | Freq: Two times a day (BID) | INTRAVENOUS | Status: DC

## 2020-07-21 MED ORDER — FENTANYL CITRATE (PF) 100 MCG/2ML IJ SOLN
INTRAMUSCULAR | Status: DC | PRN
  Administered 2020-07-21: 25 ug via INTRAVENOUS

## 2020-07-21 MED ORDER — MIDAZOLAM HCL 2 MG/2ML IJ SOLN
INTRAMUSCULAR | Status: AC
  Filled 2020-07-21: qty 2

## 2020-07-21 MED ORDER — ASPIRIN 81 MG PO CHEW
CHEWABLE_TABLET | ORAL | Status: AC
  Filled 2020-07-21: qty 1

## 2020-07-21 MED ORDER — LIDOCAINE HCL (PF) 1 % IJ SOLN
INTRAMUSCULAR | Status: AC
  Filled 2020-07-21: qty 30

## 2020-07-21 MED ORDER — HEPARIN (PORCINE) 25000 UT/250ML-% IV SOLN
1000.0000 [IU]/h | INTRAVENOUS | Status: DC
  Administered 2020-07-21: 1000 [IU]/h via INTRAVENOUS

## 2020-07-21 MED ORDER — HEPARIN (PORCINE) IN NACL 1000-0.9 UT/500ML-% IV SOLN
INTRAVENOUS | Status: AC
  Filled 2020-07-21: qty 1000

## 2020-07-21 MED ORDER — CARVEDILOL 12.5 MG PO TABS: 12.5000 mg | ORAL_TABLET | Freq: Two times a day (BID) | ORAL | 1 refills | Status: DC

## 2020-07-21 SURGICAL SUPPLY — 7 items
CATH 5F 110X4 TIG (CATHETERS) ×2 IMPLANT
DEVICE RAD TR BAND REGULAR (VASCULAR PRODUCTS) ×2 IMPLANT
GLIDESHEATH SLEND SS 6F .021 (SHEATH) ×2 IMPLANT
GUIDEWIRE INQWIRE 1.5J.035X260 (WIRE) IMPLANT
INQWIRE 1.5J .035X260CM (WIRE) ×3
KIT MANI 3VAL PERCEP (MISCELLANEOUS) ×3 IMPLANT
PACK CARDIAC CATH (CUSTOM PROCEDURE TRAY) ×3 IMPLANT

## 2020-07-21 NOTE — Progress Notes (Signed)
PT Cancellation Note  Patient Details Name: Jasmine Davidson MRN: [id number] DOB: 03-26-1956   Cancelled Treatment:    Reason Eval/Treat Not Completed: Patient at procedure or test/unavailable Pt out of room for cardiac cath.  Will maintain on PT caseload and continue to follow as appropriate.  Kreg Shropshire, DPT 07/21/2020, 10:54 AM

## 2020-07-21 NOTE — Brief Op Note (Signed)
BRIEF CARDIAC CATHETERIZATION NOTE  07/21/2020  10:42 AM  PATIENT:  Jasmine Davidson  64 y.o. female  PRE-OPERATIVE DIAGNOSIS:  syncope with non ST segment myocardial infarction  POST-OPERATIVE DIAGNOSIS:  Non-obstructive coronary artery disease  PROCEDURE:  Procedure(s): LEFT HEART CATH AND CORONARY ANGIOGRAPHY (N/A)  SURGEON:  Surgeon(s) and Role:    * Leatta Alewine, MD - Primary  FINDINGS: 1. Mild to moderate, non-obstructive CAD with 40-50% calcified mid LAD stenosis. 2. Mildly elevated left ventricular filling pressure.  RECOMMENDATIONS: 1. Continue medical therapy and risk factor modification. 2. Anticipate discharge home later today if no post-cath complications; recommend 14 day event monitor at discharge.  Nelva Bush, MD [facility]

## 2020-07-21 NOTE — Discharge Summary (Signed)
Physician Discharge Summary  [name] DGL:[id number] DOB: [birth year] DOA: 07/18/2020  PCP: Theotis Burrow, MD  Admit date: 07/18/2020 Discharge date: 07/21/2020  Admitted From: Home Disposition: Home  Recommendations for Outpatient Follow-up:  1. Follow ups as below. 2. Cardiology to arrange outpatient follow-up with heart monitor 3. Please obtain CBC/BMP/Mag at follow up 4. Recommend referral to sleep clinic for sleep study 5. Please follow up on the following pending results: None  Home Health: None Equipment/Devices: None  Discharge Condition: Stable CODE STATUS: Full code   Follow-up Information    Revelo, Elyse Jarvis, MD. Go on 08/04/2020.   Specialty: Family Medicine Why: at 9:00 a.m. Contact information: [place] [address] [phone]                Hospital Course: 64 year old female with PMH of CAD with 50% mid LAD stenosis based on card in 2015 at [facility], IDDM-2 with retinopathy and neuropathy, CKD-3A, HTN, HLD and chronic venous sufficiency presenting after near syncope associated with diaphoresis, nausea and emesis. She also has some exertional dyspnea and chest pain intermittently. She reports some abnormal heart rhythm in the past but never been told if she had A. fib or a flutter.  In ED, hemodynamically stable. 100% on RA. CBC, CMP, UA and CXR without significant finding. Sensitivity troponin 23> 68. EKG NSR without acute ischemic finding abnormal interval. COVID-19 PCR negative. Started on IV heparin. She was admitted for evaluation for near syncope and non-STEMI.  Cardiology consulted.  Orthostatic vitals were positive with the standing.  However, patient remained asymptomatic throughout her hospitalization.  Echocardiogram without significant finding.  She underwent left heart catheterization on 8/10 that revealed known 40 to 50% LAD stenosis.  She was cleared for discharge by cardiology.  Cardiology to arrange  outpatient follow-up for event monitor to evaluate for syncope.  Cardiology also recommended Coreg and discontinuing HCTZ.   See individual problem list below for more on hospital course.  Discharge Diagnoses:  NSTEMI/moderate nonobstructive CAD: LHC in 08/2014 at [facility] with 50% mid LAD stenosis. She reports exertional dyspnea and chest discomfort intermittently. Currently chest pain-free. Mild elevated troponin with delta. EKG without acute ischemic finding. Echo with EF of 65 to 70%, mild concentric LVH, G1 DD, moderate LAE. Family history of CHF in her father in his 75s. Significant risk factors.  LDL 62. A1c 5.8%.  LHC with known 40 to 50% LAD stenosis. -Continue lisinopril, atorvastatin and aspirin. -Changed metoprolol to Coreg per cardiology -Recommend referral to sleep clinic for sleep study  Near-syncope/orthostatic hypotension: Orthostatic vitals positive but normalized on the day of discharge.  She was ambulating in the room independently without symptoms. TSH within normal.  Echocardiogram and left heart catheterization without significant finding. -Cardiology to arrange outpatient event monitor -HCTZ discontinued out of concern for possible intravascular depletion contributing to this   Controlled DM-2 with hyperglycemia and CKD-3A: A1c 5.8%. On Metformin, Jardiance, Victoza and 70/30 insulin at home. Recent Labs  Lab 07/20/20 2115 07/21/20 0412 07/21/20 0822 07/21/20 1050 07/21/20 1346  GLUCAP 224* 150* 181* 132* 138*  -Discharged on home medications.  CKD stage IIIA: Stable. -Continue monitoring  Essential hypertension: Normotensive for most part.  -Cardiac meds as above  At risk for sleep apnea -Recommend outpatient sleep study  Hyperlipidemia: -Continue atorvastatin.     Body mass index is 38.14 kg/m.            Discharge Exam: Vitals:   07/21/20 1233 07/21/20 1347  BP:  (!) 120/59  Pulse:  61  Resp: 15 18  Temp:  97.6 F (36.4 C)  SpO2:   100%    GENERAL: No apparent distress.  Nontoxic. HEENT: MMM.  Vision and hearing grossly intact.  NECK: Supple.  No apparent JVD.  RESP: On RA.  No IWOB.  Fair aeration bilaterally. CVS:  RRR. Heart sounds normal.  ABD/GI/GU: Bowel sounds present. Soft. Non tender.  MSK/EXT:  Moves extremities. No apparent deformity. No edema.  SKIN: no apparent skin lesion or wound NEURO: Awake, alert and oriented appropriately.  No apparent focal neuro deficit. PSYCH: Calm. Normal affect.   Discharge Instructions  Discharge Instructions    Call MD for:  difficulty breathing, headache or visual disturbances   Complete by: As directed    Call MD for:  extreme fatigue   Complete by: As directed    Call MD for:  persistant dizziness or light-headedness   Complete by: As directed    Call MD for:  severe uncontrolled pain   Complete by: As directed    Diet - low sodium heart healthy   Complete by: As directed    Diet Carb Modified   Complete by: As directed    Discharge instructions   Complete by: As directed    It has been a pleasure taking care of you!  You were hospitalized dizziness, nausea and chest discomfort.  The test is we have done so far did not pinpoint the cause.  Cardiology to arrange cardiac monitor outpatient.  We recommend you keep yourself hydrated all the time.  Please follow-up with your primary care doctor.  We may have started you on other new medications or made some changes to your home medications during this hospitalization. Please review your new medication list and the directions carefully before you take them.    Please go to your hospital follow-up appointments or call to reschedule as recommended.   Take care,   Increase activity slowly   Complete by: As directed    No wound care   Complete by: As directed      Allergies as of 07/21/2020      Reactions   Pregabalin       Medication List    STOP taking these medications   hydrochlorothiazide 25 MG  tablet Commonly known as: HYDRODIURIL   metoprolol succinate 50 MG 24 hr tablet Commonly known as: TOPROL-XL     TAKE these medications   Aspirin 81 81 MG EC tablet Generic drug: aspirin Take 81 mg by mouth daily.   atorvastatin 40 MG tablet Commonly known as: LIPITOR TAKE ONE TABLET BY MOUTH EVERY DAY What changed:   how much to take  how to take this  when to take this  additional instructions   carvedilol 12.5 MG tablet Commonly known as: COREG Take 1 tablet (12.5 mg total) by mouth 2 (two) times daily with a meal.   EQL Vitamin D3 25 MCG (1000 UT) capsule Generic drug: Cholecalciferol Take 1,000 Units by mouth daily.   gabapentin 300 MG capsule Commonly known as: NEURONTIN Take 300 mg by mouth 3 (three) times daily.   HumuLIN 70/30 (70-30) 100 UNIT/ML injection Generic drug: insulin NPH-regular Human Inject 10-12 Units into the skin See admin instructions. Inject 12 u under the skin every morning and inject 10 u under the skin every night   Jardiance 10 MG Tabs tablet Generic drug: empagliflozin Take 10 mg by mouth daily.   liraglutide 18 MG/3ML Sopn  Commonly known as: VICTOZA Inject 0.6 mg into the skin daily.   lisinopril 20 MG tablet Commonly known as: ZESTRIL Take 1 tablet (20 mg total) by mouth daily.   metFORMIN 1000 MG (MOD) 24 hr tablet Commonly known as: GLUMETZA Take 1,000 mg by mouth 2 (two) times daily with a meal.   verapamil 120 MG CR tablet Commonly known as: CALAN-SR Take 120 mg by mouth daily.       Consultations:  Cardiology  Procedures/Studies:  2D Echo on 1956-04-25 1. Left ventricular ejection fraction, by estimation, is 65 to 70%. The  left ventricle has normal function. The left ventricle has no regional  wall motion abnormalities. There is mild concentric left ventricular  hypertrophy. Left ventricular diastolic  parameters are consistent with Grade I diastolic dysfunction (impaired  relaxation).  2. Right  ventricular systolic function is normal. The right ventricular  size is normal.  3. Left atrial size was moderately dilated.  4. The mitral valve is normal in structure. No evidence of mitral valve  regurgitation. No evidence of mitral stenosis.  5. The aortic valve was not well visualized. Aortic valve regurgitation  is not visualized. No aortic stenosis is present.    Left heart catheterization on 07/21/2020 1. Moderate, nonobstructive single-vessel coronary artery disease with 40-50% mid LAD stenosis.  Mild luminal irregularities involving ramus intermedius and right coronary artery also noted. 2. Mildly elevated left ventricular filling pressure (LVEDP 15-20 mmHg).  Recommendations: 1. Continue medical therapy and risk factor modification to prevent progression of coronary artery disease.     CARDIAC CATHETERIZATION  Result Date: 07/21/2020 Conclusions: 1. Moderate, nonobstructive single-vessel coronary artery disease with 40-50% mid LAD stenosis.  Mild luminal irregularities involving ramus intermedius and right coronary artery also noted. 2. Mildly elevated left ventricular filling pressure (LVEDP 15-20 mmHg). Recommendations: 1. Continue medical therapy and risk factor modification to prevent progression of coronary artery disease. Nelva Bush, MD [facility]   DG Chest Portable 1 View  Result Date: 07/18/2020 CLINICAL DATA:  Near syncope EXAM: PORTABLE CHEST 1 VIEW COMPARISON:  09/05/2008 FINDINGS: Heart and mediastinal contours are within normal limits. No focal opacities or effusions. No acute bony abnormality. IMPRESSION: No active disease. Electronically Signed   By: Rolm Baptise M.D.   On: 07/18/2020 15:31   ECHOCARDIOGRAM COMPLETE  Result Date: 07/19/2020    ECHOCARDIOGRAM REPORT   Patient Name:   Jasmine Davidson Date of Exam: 07/19/2020 Medical Rec #:  [id number]       Height:       66.0 in Accession #:    [id number]      Weight:       231.0 lb Date of Birth:   [birth year]       BSA:          2.126 m Patient Age:    32 years        BP:           144/64 mmHg Patient Gender: F               HR:           71 bpm. Exam Location:  [facility] Procedure: Cardiac Doppler and Color Doppler Indications:     Syncope 780.2  History:         Patient has no prior history of Echocardiogram examinations.                  CAD; Risk Factors:Hypertension and Diabetes.  Sonographer:  Sherrie Sport RDCS (AE) Referring Phys:  [id number] Cleaster Corin PATEL Diagnosing Phys: Skeet Latch MD IMPRESSIONS  1. Left ventricular ejection fraction, by estimation, is 65 to 70%. The left ventricle has normal function. The left ventricle has no regional wall motion abnormalities. There is mild concentric left ventricular hypertrophy. Left ventricular diastolic parameters are consistent with Grade I diastolic dysfunction (impaired relaxation).  2. Right ventricular systolic function is normal. The right ventricular size is normal.  3. Left atrial size was moderately dilated.  4. The mitral valve is normal in structure. No evidence of mitral valve regurgitation. No evidence of mitral stenosis.  5. The aortic valve was not well visualized. Aortic valve regurgitation is not visualized. No aortic stenosis is present. FINDINGS  Left Ventricle: Left ventricular ejection fraction, by estimation, is 65 to 70%. The left ventricle has normal function. The left ventricle has no regional wall motion abnormalities. The left ventricular internal cavity size was normal in size. There is  mild concentric left ventricular hypertrophy. Left ventricular diastolic parameters are consistent with Grade I diastolic dysfunction (impaired relaxation). Indeterminate filling pressures. Right Ventricle: The right ventricular size is normal. No increase in right ventricular wall thickness. Right ventricular systolic function is normal. Left Atrium: Left atrial size was moderately dilated. Right Atrium: Right atrial size was normal in size.  Pericardium: There is no evidence of pericardial effusion. Mitral Valve: The mitral valve is normal in structure. Normal mobility of the mitral valve leaflets. No evidence of mitral valve regurgitation. No evidence of mitral valve stenosis. Tricuspid Valve: The tricuspid valve is normal in structure. Tricuspid valve regurgitation is trivial. No evidence of tricuspid stenosis. Aortic Valve: The aortic valve was not well visualized. Aortic valve regurgitation is not visualized. No aortic stenosis is present. Aortic valve mean gradient measures 3.0 mmHg. Aortic valve peak gradient measures 5.0 mmHg. Aortic valve area, by VTI measures 2.74 cm. Pulmonic Valve: The pulmonic valve was normal in structure. Pulmonic valve regurgitation is not visualized. No evidence of pulmonic stenosis. Aorta: The aortic root is normal in size and structure. IAS/Shunts: No atrial level shunt detected by color flow Doppler.  LEFT VENTRICLE PLAX 2D LVIDd:         4.13 cm  Diastology LVIDs:         2.34 cm  LV e' lateral:   9.14 cm/s LV PW:         1.18 cm  LV E/e' lateral: 8.3 LV IVS:        0.88 cm  LV e' medial:    6.31 cm/s LVOT diam:     2.00 cm  LV E/e' medial:  12.1 LV SV:         72 LV SV Index:   34 LVOT Area:     3.14 cm  RIGHT VENTRICLE RV Basal diam:  3.09 cm RV S prime:     15.40 cm/s TAPSE (M-mode): 3.8 cm LEFT ATRIUM             Index       RIGHT ATRIUM           Index LA diam:        4.70 cm 2.21 cm/m  RA Area:     18.90 cm LA Vol (A2C):   79.6 ml 37.44 ml/m RA Volume:   49.50 ml  23.28 ml/m LA Vol (A4C):   63.0 ml 29.63 ml/m LA Biplane Vol: 71.8 ml 33.77 ml/m  AORTIC VALVE  PULMONIC VALVE AV Area (Vmax):    2.40 cm    PV Vmax:        0.61 m/s AV Area (Vmean):   2.44 cm    PV Peak grad:   1.5 mmHg AV Area (VTI):     2.74 cm    RVOT Peak grad: 2 mmHg AV Vmax:           112.00 cm/s AV Vmean:          78.900 cm/s AV VTI:            0.263 m AV Peak Grad:      5.0 mmHg AV Mean Grad:      3.0 mmHg LVOT  Vmax:         85.50 cm/s LVOT Vmean:        61.300 cm/s LVOT VTI:          0.229 m LVOT/AV VTI ratio: 0.87  AORTA Ao Root diam: 2.90 cm MITRAL VALVE               TRICUSPID VALVE MV Area (PHT): 3.50 cm    TR Peak grad:   18.7 mmHg MV Decel Time: 217 msec    TR Vmax:        216.00 cm/s MV E velocity: 76.30 cm/s MV A velocity: 81.40 cm/s  SHUNTS MV E/A ratio:  0.94        Systemic VTI:  0.23 m                            Systemic Diam: 2.00 cm Skeet Latch MD Electronically signed by Skeet Latch MD Signature Date/Time: 07/19/2020/1:12:02 PM    Final        The results of significant diagnostics from this hospitalization (including imaging, microbiology, ancillary and laboratory) are listed below for reference.     Microbiology: Recent Results (from the past 240 hour(s))  SARS Coronavirus 2 by RT PCR (hospital order, performed in [facility] hospital lab) Nasopharyngeal Nasopharyngeal Swab     Status: None   Collection Time: 07/18/20  6:28 PM   Specimen: Nasopharyngeal Swab  Result Value Ref Range Status   SARS Coronavirus 2 NEGATIVE NEGATIVE Final    Comment: (NOTE) SARS-CoV-2 target nucleic acids are NOT DETECTED.  The SARS-CoV-2 RNA is generally detectable in upper and lower respiratory specimens during the acute phase of infection. The lowest concentration of SARS-CoV-2 viral copies this assay can detect is 250 copies / mL. A negative result does not preclude SARS-CoV-2 infection and should not be used as the sole basis for treatment or other patient management decisions.  A negative result may occur with improper specimen collection / handling, submission of specimen other than nasopharyngeal swab, presence of viral mutation(s) within the areas targeted by this assay, and inadequate number of viral copies (<250 copies / mL). A negative result must be combined with clinical observations, patient history, and epidemiological information.  Fact Sheet for Patients:     StrictlyIdeas.no  Fact Sheet for Healthcare Providers: BankingDealers.co.za  This test is not yet approved or  cleared by the [place] FDA and has been authorized for detection and/or diagnosis of SARS-CoV-2 by FDA under an Emergency Use Authorization (EUA).  This EUA will remain in effect (meaning this test can be used) for the duration of the COVID-19 declaration under Section 564(b)(1) of the Act, 21 U.S.C. section 360bbb-3(b)(1), unless the authorization is terminated or revoked sooner.  Performed at [facility], [address] [place], [place] [id number]      Labs: BNP (last 3 results) No results for input(s): BNP in the last 8760 hours. Basic Metabolic Panel: Recent Labs  Lab 07/18/20 1441 07/18/20 1946 07/19/20 0208 07/20/20 0434 07/21/20 0621  NA 136  --  138 139 136  K 4.3  --  4.1 4.1 3.8  CL 106  --  105 106 102  CO2 20*  --  25 23 23   GLUCOSE 164*  --  146* 140* 157*  BUN 28*  --  30* 27* 24*  CREATININE 1.30*  --  1.27* 1.21* 1.30*  CALCIUM 8.3*  --  8.8* 8.7* 8.5*  MG  --  1.8  --  2.1  --   PHOS  --   --   --  3.7 3.8   Liver Function Tests: Recent Labs  Lab 07/18/20 1441 07/20/20 0434 07/21/20 0621  AST 15  --   --   ALT 13  --   --   ALKPHOS 72  --   --   BILITOT 1.0  --   --   PROT 6.2*  --   --   ALBUMIN 3.7 3.3* 3.3*   No results for input(s): LIPASE, AMYLASE in the last 168 hours. No results for input(s): AMMONIA in the last 168 hours. CBC: Recent Labs  Lab 07/18/20 1441 07/19/20 0208 07/20/20 0434 07/21/20 0621  WBC 6.2 4.0 5.5 5.9  NEUTROABS 4.8  --   --   --   HGB 13.0 12.5 12.2 12.3  HCT 39.2 37.6 36.8 36.3  MCV 93.6 93.3 94.6 91.9  PLT 149* 155 152 151   Cardiac Enzymes: No results for input(s): CKTOTAL, CKMB, CKMBINDEX, TROPONINI in the last 168 hours. BNP: Invalid input(s): POCBNP CBG: Recent Labs  Lab 07/20/20 2115 07/21/20 0412 07/21/20 0822  07/21/20 1050 07/21/20 1346  GLUCAP 224* 150* 181* 132* 138*   D-Dimer No results for input(s): DDIMER in the last 72 hours. Hgb A1c Recent Labs    07/18/20 1946  HGBA1C 5.8*   Lipid Profile Recent Labs    07/20/20 0434  CHOL 108  HDL 28*  LDLCALC 62  TRIG 88  CHOLHDL 3.9   Thyroid function studies Recent Labs    07/19/20 0208  TSH 1.275   Anemia work up No results for input(s): VITAMINB12, FOLATE, FERRITIN, TIBC, IRON, RETICCTPCT in the last 72 hours. Urinalysis    Component Value Date/Time   COLORURINE YELLOW (A) 07/18/2020 1650   APPEARANCEUR CLEAR (A) 07/18/2020 1650   APPEARANCEUR Clear 05/31/2014 1920   LABSPEC 1.013 07/18/2020 1650   LABSPEC 1.004 05/31/2014 1920   PHURINE 7.0 07/18/2020 1650   GLUCOSEU >=500 (A) 07/18/2020 1650   GLUCOSEU 50 mg/dL 05/31/2014 1920   HGBUR NEGATIVE 07/18/2020 1650   BILIRUBINUR NEGATIVE 07/18/2020 1650   BILIRUBINUR Negative 05/31/2014 1920   KETONESUR 20 (A) 07/18/2020 1650   PROTEINUR NEGATIVE 07/18/2020 1650   NITRITE NEGATIVE 07/18/2020 1650   LEUKOCYTESUR TRACE (A) 07/18/2020 1650   LEUKOCYTESUR Negative 05/31/2014 1920   Sepsis Labs Invalid input(s): PROCALCITONIN,  WBC,  LACTICIDVEN   Time coordinating discharge: 35 minutes  SIGNED:  Mercy Riding, MD  [facility] 07/21/2020, 4:17 PM  If 7PM-7AM, please contact night-coverage [email or web address] Password [email or web address]

## 2020-07-21 NOTE — Consult Note (Signed)
ANTICOAGULATION CONSULT NOTE  Pharmacy Consult for Heparin Infusion Dosing and Monitoring  Indication: chest pain/ACS  Patient Measurements: Height: 5\' 6"  (167.6 cm) Weight: 107.2 kg (236 lb 4.8 oz) IBW/kg (Calculated) : 59.3 Heparin Dosing Weight: 83.3 kg  Vital Signs: Temp: 99.1 F (37.3 C) (08/10 0411) Temp Source: Oral (08/10 0411) BP: 117/61 (08/10 0411) Pulse Rate: 80 (08/10 0411)  Labs: Recent Labs    07/18/20 1441 07/18/20 1441 07/18/20 1650 07/18/20 1946 07/18/20 2123 07/19/20 0208 07/19/20 0208 07/19/20 1205 07/19/20 2236 07/20/20 0434 07/21/20 0621  HGB 13.0   < >  --   --   --  12.5   < >  --   --  12.2 12.3  HCT 39.2   < >  --   --   --  37.6  --   --   --  36.8 36.3  PLT 149*   < >  --   --   --  155  --   --   --  152 151  APTT 26  --   --   --   --   --   --   --   --   --   --   LABPROT 12.2  --   --   --   --   --   --   --   --   --   --   INR 0.9  --   --   --   --   --   --   --   --   --   --   HEPARINUNFRC  --   --   --   --   --  0.76*  --    < > 0.50 0.45 0.28*  CREATININE 1.30*   < >  --   --   --  1.27*  --   --   --  1.21* 1.30*  TROPONINIHS 23*   < > 68* 70* 53*  --   --   --   --   --   --    < > = values in this interval not displayed.    Estimated Creatinine Clearance: 54.9 mL/min (A) (by C-G formula based on SCr of 1.3 mg/dL (H)).   Medical History: Past Medical History:  Diagnosis Date   CAD (coronary artery disease)    CKD (chronic kidney disease) stage 3, GFR 30-59 ml/min    Diabetes mellitus    HTN (hypertension)    Obesity     Assessment: Pharmacy consulted for heparin infusion dosing and monitoring for 64 yo female for ACS/STEMI.  Troponin HS now trending down, H&H, platelets are stable. Chronic anticoagulation prior to admission includes only aspirin.   Heparin Course: 08/07 initiation: 4000 unit bolus, then 1000 units/hr 08/08 0208 HL 0.76: reduce to 950 units/hr 08/08 1205 HL 0.76: reduce to 850  units/hr 08/09 0400 HL 0.45: Continue 850 units/hr 08/10 0621 HL 0.28: 1250 unit bolus, then increase drip rate to 1000 units/hr   Goal of Therapy:  Heparin level 0.3-0.7 units/ml Monitor platelets by anticoagulation protocol: Yes   Plan:  08/10 @ 0621 HL 0.28 subtherapeutic. Will bolus 1250 units and increase rate by 2 units/kg/hr (150 units) to 1000 units/hr. Recheck HL 6 hours after start of infusion. CBC stable will continue to monitor.  Benn Moulder, PharmD Pharmacy Resident  07/21/2020 8:06 AM

## 2020-07-21 NOTE — Progress Notes (Signed)
Progress Note  Patient Name: Jasmine Davidson Date of Encounter: 07/21/2020  [facility] Cardiologist: [facility]  Subjective   Patient feels well without chest pain, shortness of breath, or lightheadedness.  Inpatient Medications    Scheduled Meds: . aspirin EC  81 mg Oral Daily  . atorvastatin  40 mg Oral QHS  . carvedilol  12.5 mg Oral BID WC  . [START ON 07/22/2020] enoxaparin (LOVENOX) injection  40 mg Subcutaneous Q24H  . gabapentin  300 mg Oral TID  . insulin aspart  0-9 Units Subcutaneous TID WC  . lisinopril  20 mg Oral Daily  . sodium chloride flush  3 mL Intravenous Q12H  . sodium chloride flush  3 mL Intravenous Q12H  . verapamil  120 mg Oral Daily   Continuous Infusions: . sodium chloride     PRN Meds: sodium chloride, acetaminophen **OR** acetaminophen, hydrALAZINE, labetalol, ondansetron **OR** ondansetron (ZOFRAN) IV, sodium chloride flush   Vital Signs    Vitals:   07/21/20 1200 07/21/20 1232 07/21/20 1233 07/21/20 1347  BP: (!) 95/56 (!) 95/52  (!) 120/59  Pulse: 61 60  61  Resp: 14 13 15 18   Temp:    97.6 F (36.4 C)  TempSrc:    Oral  SpO2: 99% 97%  100%  Weight:      Height:        Intake/Output Summary (Last 24 hours) at 07/21/2020 1459 Last data filed at 07/20/2020 1857 Gross per 24 hour  Intake 0 ml  Output --  Net 0 ml   Last 3 Weights 07/21/2020 07/20/2020 07/18/2020  Weight (lbs) 236 lb 4.8 oz 236 lb 6.4 oz 231 lb  Weight (kg) 107.185 kg 107.23 kg 104.781 kg      Telemetry    Sinus rhythm. - Personally Reviewed  ECG    No new tracing.  Physical Exam   GEN: No acute distress.   Neck: No JVD Cardiac: RRR, no murmurs, rubs, or gallops.  Respiratory: Clear to auscultation bilaterally. GI: Soft, nontender, non-distended  MS: No edema; No deformity. Neuro:  Nonfocal  Psych: Normal affect   Labs    High Sensitivity Troponin:   Recent Labs  Lab 07/18/20 1441 07/18/20 1650 07/18/20 1946 07/18/20 2123  TROPONINIHS 23* 68* 70*  53*      Chemistry Recent Labs  Lab 07/18/20 1441 07/18/20 1441 07/19/20 0208 07/20/20 0434 07/21/20 0621  NA 136   < > 138 139 136  K 4.3   < > 4.1 4.1 3.8  CL 106   < > 105 106 102  CO2 20*   < > 25 23 23   GLUCOSE 164*   < > 146* 140* 157*  BUN 28*   < > 30* 27* 24*  CREATININE 1.30*   < > 1.27* 1.21* 1.30*  CALCIUM 8.3*   < > 8.8* 8.7* 8.5*  PROT 6.2*  --   --   --   --   ALBUMIN 3.7  --   --  3.3* 3.3*  AST 15  --   --   --   --   ALT 13  --   --   --   --   ALKPHOS 72  --   --   --   --   BILITOT 1.0  --   --   --   --   GFRNONAA 44*   < > 45* 48* 44*  GFRAA 51*   < > 52* 55* 51*  ANIONGAP 10   < >  8 10 11    < > = values in this interval not displayed.     Hematology Recent Labs  Lab 07/19/20 0208 07/20/20 0434 07/21/20 0621  WBC 4.0 5.5 5.9  RBC 4.03 3.89 3.95  HGB 12.5 12.2 12.3  HCT 37.6 36.8 36.3  MCV 93.3 94.6 91.9  MCH 31.0 31.4 31.1  MCHC 33.2 33.2 33.9  RDW 12.5 12.6 12.8  PLT 155 152 151    BNPNo results for input(s): BNP, PROBNP in the last 168 hours.   DDimer No results for input(s): DDIMER in the last 168 hours.   Radiology    None.  Cardiac Studies   LHC (07/21/2020): 1. Moderate, nonobstructive single-vessel coronary artery disease with 40-50% mid LAD stenosis.  Mild luminal irregularities involving ramus intermedius and right coronary artery also noted. 2. Mildly elevated left ventricular filling pressure (LVEDP 15-20 mmHg).  TTE (07/19/2020): 1. Left ventricular ejection fraction, by estimation, is 65 to 70%. The  left ventricle has normal function. The left ventricle has no regional  wall motion abnormalities. There is mild concentric left ventricular  hypertrophy. Left ventricular diastolic  parameters are consistent with Grade I diastolic dysfunction (impaired  relaxation).  2. Right ventricular systolic function is normal. The right ventricular  size is normal.  3. Left atrial size was moderately dilated.  4. The mitral  valve is normal in structure. No evidence of mitral valve  regurgitation. No evidence of mitral stenosis.  5. The aortic valve was not well visualized. Aortic valve regurgitation  is not visualized. No aortic stenosis is present.   Patient Profile     64 y.o. female woman with history of nonobstructive coronary artery disease, chronic kidney disease stage III, COVID-19 in 11/2019, hypertension, hyperlipidemia, and venous insufficiency, admitted with near syncope and elevated troponin.  Assessment & Plan    Near syncope and elevated troponin: No further symptoms reported.  Catheterization today showed nonobstructive coronary artery disease with up to 40 to 50% mid LAD stenosis.  Near syncope unlikely to be due to coronary artery disease.  Suspect this may have represented a vasovagal episode.  Continue aspirin and statin therapy.  Plan for 14-day ambulatory monitor at discharge.  Continue carvedilol and verapamil.  Hypertension: Blood pressure labile but overall normal on most recent readings.  Continue current regimen of carvedilol, lisinopril, and verapamil.  Defer restarting HCTZ in the setting of acute on chronic kidney disease and near syncope that could have been exacerbated by intravascular volume depletion.  Hyperlipidemia: LDL at goal.  Continue atorvastatin 40 mg daily.  [facility] will sign off.   Medication Recommendations: See above. Other recommendations (labs, testing, etc): 14-day ZIO AT monitor to be placed at the time of discharge. Follow up as an outpatient: Follow-up with primary cardiologist in 3 to 4 weeks to review monitor results.  For questions or updates, please contact [place] Please consult [email or web address] for contact info under [facility].  Signed, Nelva Bush, MD  07/21/2020, 2:59 PM

## 2020-07-21 NOTE — Interval H&P Note (Signed)
History and Physical Interval Note:  07/21/2020 9:50 AM  Jasmine Davidson  has presented today for surgery, with the diagnosis of syncope with non ST segment myocardial infarction.  The various methods of treatment have been discussed with the patient and family. After consideration of risks, benefits and other options for treatment, the patient has consented to  Procedure(s): LEFT HEART CATH AND CORONARY ANGIOGRAPHY (N/A) as a surgical intervention.  The patient's history has been reviewed, patient examined, no change in status, stable for surgery.  I have reviewed the patient's chart and labs.  Questions were answered to the patient's satisfaction.    Cath Lab Visit (complete for each Cath Lab visit)  Clinical Evaluation Leading to the Procedure:   ACS: Yes.    Non-ACS:  N/A  Sharnice Bosler

## 2020-07-22 ENCOUNTER — Telehealth: Payer: Self-pay

## 2020-07-22 NOTE — Telephone Encounter (Signed)
Attempted to schedule.  LMOV to call office.  ° °

## 2020-07-22 NOTE — Telephone Encounter (Signed)
-----   Message from Nelva Bush, MD sent at 07/21/2020  5:09 PM EDT ----- Regarding: Hospital f/u Hello,  Could you schedule Jasmine Davidson to be seen by Dr. Fletcher Anon, me, or an APP in ~1 month for hospital f/u?  Thanks.  Gerald Stabs

## 2020-07-24 ENCOUNTER — Telehealth: Payer: Self-pay | Admitting: Cardiovascular Disease

## 2020-07-24 NOTE — Telephone Encounter (Signed)
Patient said she called [employer] and they said it would be fine to have a ultrasound just not an MRI.  Patient was appreciative.

## 2020-07-24 NOTE — Telephone Encounter (Signed)
Patient wearing zio and wants to know if she can still do thyroid US on Monday .   Advised patient to call support number on zio box to expedite an answer while waiting on call back

## 2020-07-29 ENCOUNTER — Telehealth: Payer: Self-pay | Admitting: Physician Assistant

## 2020-07-29 NOTE — Telephone Encounter (Signed)
I called and spoke with nurse clinical review specialist at [employer] to discuss the message below in further detail. The nurse pulled up the patient and advised a peer to peer review had been done and that the patient's monitor was approved starting 07/22/20. She wanted to confirm this was the correct date. I advised her the date that monitor was placed per [employer] is 07/21/20.  Per nursing, she states that "whoever put in that date" will need to fix it.  She cannot give me an approval starting before 07/22/20. I advised her I would need to follow up on this and call back.  I reached out to the [employer] rep) and advise her of the message and my conversation with [employer].  I advised I wasn't sure if [employer] was involved in some of this in the background. Per Angelita Ingles is the one who was involved in this. She asked for the serial # for the monitor and she will have someone lay eyes on this from [employer] to make sure everything is ok.  She will let me know if there is anything else we need to do.

## 2020-07-29 NOTE — Telephone Encounter (Signed)
[employer] is calling to discuss that the monitor this patient has and states the monitor codes are incorrect which is resulting in a billing issue The [employer] company is wanting to withdraw the case however if they are instructed, they can forward this to [facility] for further review To fix this matter [employer] needs to be contacted at [phone] or [phone]  [phone] (codes for [place])

## 2020-07-31 ENCOUNTER — Ambulatory Visit: Payer: Medicare HMO | Admitting: Physician Assistant

## 2020-08-20 ENCOUNTER — Other Ambulatory Visit: Payer: Self-pay

## 2020-08-20 DIAGNOSIS — Z87898 Personal history of other specified conditions: Secondary | ICD-10-CM

## 2020-08-25 NOTE — Progress Notes (Signed)
Cardiology Office Note    Date:  08/27/2020   ID:  Jasmine Davidson, DOB [birth year], MRN [id number]  PCP:  Jasmine Burrow, MD  Cardiologist:  Jasmine Rogue, MD  Electrophysiologist:  None   Chief Complaint: Hospital follow up  History of Present Illness:   Jasmine Davidson is a 64 y.o. female with history of nonobstructive CAD as outlined below, presyncope, pSVT, NSVT, CKD stage III, COVID-19 in 11/2019, IDDM with diabetic retinopathy and neuropathy, HTN, HLD, PVD followed by [facility], and venous insufficiency who presents for hospital follow up after recent admission to [facility] from 8/7 to 8/10 for near syncope.   She previously underwent cardiac cath at [facility] in 2015 revealed 50% mid LAD stenosis with an FFR of 0.88, that was not hemodynamically significant, and LVEF of 60%.  She was also noted to have nonobstructive renal and SFA disease with an LVEDP of 11 mmHg.  She was diagnosed with COVID-19 in 11/2019.  She was admitted to [facility] on 8/7 with severe dizziness and presyncope as well as a several week history of left-sided chest discomfort that was not occurring with her dizziness and occurred mostly with exertion.  Overall, she had generally been feeling fatigued leading up to her admission and was having to stop and take breaks to do routine housework.  BP was hypertensive upon arrival.  HS-Tn peaked at 70.  Chest x-ray showed no active cardiopulmonary disease.  EKG showed sinus rhythm with no acute ST-T changes.  Orthostatic vitals were positive upon standing.  Echo demonstrated an EF of 65 to 70%, no regional wall motion abnormalities, grade 1 diastolic dysfunction, normal RV systolic function and ventricular cavity size, moderately dilated left atrium, and no significant valvular disease.  She underwent diagnostic LHC on 07/21/2020 which showed moderate 1-vessel CAD with 40 to 50% mid LAD stenosis, minimal luminal irregularities involving the ramus intermedius and RCA were also noted.  Mildly  elevated LVEDP at 15 to 20 mmHg.  Medical therapy was recommended.  There were no significant arrhythmias or prolonged pauses noted on telemetry.  With acute on CKD, her HCTZ was held.  It was possible her presyncope was represented by orthostasis versus vasovagal episode.  Outpatient cardiac monitoring demonstrated a predominant rhythm of sinus with an average heart rate of 74 bpm (range 57 to 110 bpm in sinus), rare PACs and PVCs, 5 short runs of PSVT were noted with the longest interval lasting 7 beats with a maximum rate of 119 bpm.  Single 5 beat run of NSVT was noted with a maximum rate of 156 bpm, no sustained arrhythmias or prolonged pauses were noted.  She comes in doing well from a cardiac perspective.  No chest pain, palpitations, or dyspnea.  She has continued to have intermittent symptoms, most recently while in the shower.  No syncopal episodes.  She does feel like she drinks a fair amount of water on a daily basis.  Her blood sugars have been low recently leading to the tapering of her insulin.  She did have dizziness and presyncope while wearing her outpatient cardiac monitor without significant arrhythmia, symptomatic bradycardia, high-grade AV block, or prolonged pauses noted.  She is followed by the wound clinic and vascular surgery at [facility] for underlying PVD.   Labs independently reviewed: 07/2020 - potassium 3.8, BUN 24, SCr 1.30, HGB 12.3, PLT 151, TC 108, TG 88, HDL 28, LDL 62, magnesium 2.1, TSH normal, A1c 5.8, albumin 3.7, AST/ALT normal  Past Medical History:  Diagnosis  Date  . CAD (coronary artery disease)   . CKD (chronic kidney disease) stage 3, GFR 30-59 ml/min   . Diabetes mellitus   . HTN (hypertension)   . Obesity     Past Surgical History:  Procedure Laterality Date  . BLADDER REPAIR W/ CESAREAN SECTION  1981  . BREAST BIOPSY Right 12/17/2014   negative stereotactic  . CARDIAC CATHETERIZATION    . cyst taken off finger  2011  . LEFT HEART CATH AND CORONARY  ANGIOGRAPHY N/A 07/21/2020   Procedure: LEFT HEART CATH AND CORONARY ANGIOGRAPHY;  Surgeon: Nelva Bush, MD;  Location: [facility];  Service: Cardiovascular;  Laterality: N/A;  . TOE SURGERY  02/08/2013    Current Medications: Current Meds  Medication Sig  . aspirin (ASPIRIN 81) 81 MG EC tablet Take 81 mg by mouth daily.   Marland Kitchen atorvastatin (LIPITOR) 40 MG tablet TAKE ONE TABLET BY MOUTH EVERY DAY (Patient taking differently: Take 40 mg by mouth at bedtime. )  . carvedilol (COREG) 12.5 MG tablet Take 1 tablet (12.5 mg total) by mouth 2 (two) times daily with a meal.  . Cholecalciferol (EQL VITAMIN D3) 25 MCG (1000 UT) capsule Take 1,000 Units by mouth daily.  . empagliflozin (JARDIANCE) 10 MG TABS tablet Take 10 mg by mouth daily.  Marland Kitchen gabapentin (NEURONTIN) 300 MG capsule Take 300 mg by mouth 2 (two) times daily.   . insulin NPH-insulin regular (HUMULIN 70/30) (70-30) 100 UNIT/ML injection Inject 5 Units into the skin 2 (two) times daily with a meal. Inject 12 u under the skin every morning and inject 10 u under the skin every night  . liraglutide (VICTOZA) 18 MG/3ML SOPN Inject 0.6 mg into the skin daily.  Marland Kitchen lisinopril (ZESTRIL) 20 MG tablet Take 1 tablet (20 mg total) by mouth daily.  . metFORMIN (GLUMETZA) 1000 MG (MOD) 24 hr tablet Take 1,000 mg by mouth 2 (two) times daily with a meal.  . verapamil (CALAN-SR) 120 MG CR tablet Take 120 mg by mouth daily.    Allergies:   Pregabalin   Social History   Socioeconomic History  . Marital status: Married    Spouse name: Not on file  . Number of children: Not on file  . Years of education: Not on file  . Highest education level: Not on file  Occupational History  . Not on file  Tobacco Use  . Smoking status: Never Smoker  . Smokeless tobacco: Never Used  . Tobacco comment: tobacco use- no   Substance and Sexual Activity  . Alcohol use: No  . Drug use: No  . Sexual activity: Not on file  Other Topics Concern  . Not on  file  Social History Narrative   Full time. Does not regularly exercise.    Social Determinants of Health   Financial Resource Strain:   . Difficulty of Paying Living Expenses: Not on file  Food Insecurity:   . Worried About Charity fundraiser in the Last Year: Not on file  . Ran Out of Food in the Last Year: Not on file  Transportation Needs:   . Lack of Transportation (Medical): Not on file  . Lack of Transportation (Non-Medical): Not on file  Physical Activity:   . Days of Exercise per Week: Not on file  . Minutes of Exercise per Session: Not on file  Stress:   . Feeling of Stress : Not on file  Social Connections:   . Frequency of Communication with Friends and  Family: Not on file  . Frequency of Social Gatherings with Friends and Family: Not on file  . Attends Religious Services: Not on file  . Active Member of Clubs or Organizations: Not on file  . Attends Archivist Meetings: Not on file  . Marital Status: Not on file     Family History:  The patient's family history includes COPD in her mother; Diabetes in her father; Heart failure in her father; Hypertension in her father; Lupus in her father.  ROS:   Review of Systems  Constitutional: Positive for malaise/fatigue. Negative for chills, diaphoresis, fever and weight loss.  HENT: Negative for congestion.   Eyes: Negative for discharge and redness.  Respiratory: Negative for cough, sputum production, shortness of breath and wheezing.   Cardiovascular: Negative for chest pain, palpitations, orthopnea, claudication, leg swelling and PND.  Gastrointestinal: Negative for abdominal pain, heartburn, nausea and vomiting.  Musculoskeletal: Negative for falls and myalgias.  Skin: Negative for rash.  Neurological: Positive for dizziness and weakness. Negative for tingling, tremors, sensory change, speech change, focal weakness and loss of consciousness.  Endo/Heme/Allergies: Does not bruise/bleed easily.   Psychiatric/Behavioral: Negative for substance abuse. The patient is not nervous/anxious.   All other systems reviewed and are negative.    EKGs/Labs/Other Studies Reviewed:    Studies reviewed were summarized above. The additional studies were reviewed today:  2D echo 07/2020: 1. Left ventricular ejection fraction, by estimation, is 65 to 70%. The  left ventricle has normal function. The left ventricle has no regional  wall motion abnormalities. There is mild concentric left ventricular  hypertrophy. Left ventricular diastolic  parameters are consistent with Grade I diastolic dysfunction (impaired  relaxation).  2. Right ventricular systolic function is normal. The right ventricular  size is normal.  3. Left atrial size was moderately dilated.  4. The mitral valve is normal in structure. No evidence of mitral valve  regurgitation. No evidence of mitral stenosis.  5. The aortic valve was not well visualized. Aortic valve regurgitation  is not visualized. No aortic stenosis is present. __________  [facility] 07/2020: Conclusions: 1. Moderate, nonobstructive single-vessel coronary artery disease with 40-50% mid LAD stenosis.  Mild luminal irregularities involving ramus intermedius and right coronary artery also noted. 2. Mildly elevated left ventricular filling pressure (LVEDP 15-20 mmHg).  Recommendations: 1. Continue medical therapy and risk factor modification to prevent progression of coronary artery disease. __________  [facility] 07/2020:  The patient was monitored for 14 days.  The predominant rhythm was sinus with an average rate of 74 bpm (range 57-110 bpm in sinus).  There were rare PAC's and PVC's. Five atrial runs lasting up to 7 beats occurred with a maximum rate of 119 bpm.  A single 5 beat run of nonsustained ventricular tachycardia also occurred with a maximum rate of 156 bpm.  No sustained arrhythmia or prolonged pause was observed.  There were no patient triggered  events.   Predominantly sinus rhythm with rare PAC' and PVC's.  Rare episodes of brief PSVT and NSVT also noted.   EKG:  EKG is ordered today.  The EKG ordered today demonstrates NSR, 66 bpm, first-degree AV block (previously noted), no acute ST-T changes  Recent Labs: 07/18/2020: ALT 13 07/19/2020: TSH 1.275 07/20/2020: Magnesium 2.1 07/21/2020: BUN 24; Creatinine, Ser 1.30; Hemoglobin 12.3; Platelets 151; Potassium 3.8; Sodium 136  Recent Lipid Panel    Component Value Date/Time   CHOL 108 07/20/2020 0434   TRIG 88 07/20/2020 0434   TRIG 49 11/16/2009  0000   HDL 28 (L) 07/20/2020 0434   CHOLHDL 3.9 07/20/2020 0434   VLDL 18 07/20/2020 0434   LDLCALC 62 07/20/2020 0434   LDLCALC 58 11/16/2009 0000    PHYSICAL EXAM:    VS:  BP 132/76 (BP Location: Left Arm, Patient Position: Sitting, Cuff Size: Normal)   Pulse 66   Ht 5\' 6"  (1.676 m)   Wt 231 lb 6.4 oz (105 kg)   LMP 09/12/1999   SpO2 98%   BMI 37.35 kg/m   BMI: Body mass index is 37.35 kg/m.  Physical Exam Constitutional:      Appearance: She is well-developed.  HENT:     Head: Normocephalic and atraumatic.  Eyes:     General:        Right eye: No discharge.        Left eye: No discharge.  Neck:     Vascular: No JVD.  Cardiovascular:     Rate and Rhythm: Normal rate and regular rhythm.     Pulses: No midsystolic click and no opening snap.          Posterior tibial pulses are 1+ on the right side and 1+ on the left side.     Heart sounds: Normal heart sounds, S1 normal and S2 normal. Heart sounds not distant. No murmur heard.  No friction rub.     Comments: Right radial cardiac cath site is well-healing without bleeding, bruising, swelling, warmth, erythema, or tenderness to palpation.  Radial pulse 2+. Pulmonary:     Effort: Pulmonary effort is normal. No respiratory distress.     Breath sounds: Normal breath sounds. No decreased breath sounds, wheezing or rales.  Chest:     Chest wall: No tenderness.  Abdominal:      General: There is no distension.     Palpations: Abdomen is soft.     Tenderness: There is no abdominal tenderness.  Musculoskeletal:     Cervical back: Normal range of motion.  Skin:    General: Skin is warm and dry.     Nails: There is no clubbing.  Neurological:     Mental Status: She is alert and oriented to person, place, and time.  Psychiatric:        Speech: Speech normal.        Behavior: Behavior normal.        Thought Content: Thought content normal.        Judgment: Judgment normal.     Wt Readings from Last 3 Encounters:  08/27/20 231 lb 6.4 oz (105 kg)  07/21/20 236 lb 4.8 oz (107.2 kg)  04/15/20 262 lb 9.1 oz (119.1 kg)     Orthostatic signs: Lying: 150/85, 66 bpm Sitting: 149/84, 68 bpm Standing: 126/75, 74 bpm X3 minutes: 139/80, 73 bpm  ASSESSMENT & PLAN:   1. Nonobstructive CAD: No symptoms concerning for angina.  Recent diagnostic LHC with 40 to 50% mid LAD stenosis minor luminal irregularities involving essentially the same RCA.  Risk factor modification and continued medical therapy is recommended including aspirin, atorvastatin, carvedilol, and lisinopril.  Post-cath instructions.  No plans for further ischemic evaluation at this time.  2. Presyncope: Felt to be orthostasis pain etiology during hospital admission with noted positive orthostatics at that time.  She has had 1 further episode of dizziness that occurred while in the shower.  Orthostatics remain positive when going from sitting to standing in the office today.  She does indicate her drinking a fair amount of water daily.  We will hold her verapamil.  Recommend slow positional changes.  Cardiac work-up thus far including echo, diagnostic LHC, and outpatient Zio patch monitoring have been unrevealing.  She did report episodes of presyncope/dizziness while wearing her Zio patch without significant arrhythmia, symptomatic bradycardia, high-grade AV block, or prolonged pauses being noted.  No  further cardiac testing needed at this time.  Cannot exclude some issues with hypoglycemia as well as she indicates her blood sugar has been low lately.  Follow-up with PCP as directed.  3. Paroxysmal SVT/NSVT: Outpatient cardiac monitoring demonstrated a single 7 beat run of paroxysmal SVT/atrial tachycardia as well as a 5 beat run of NSVT.  She was asymptomatic during these events.  Recent echo demonstrated preserved LVSF and ischemic evaluation showed nonobstructive disease as outlined above.  Continue carvedilol 12.5 mg twice daily.  4. HTN: Blood pressure is reasonably controlled with noted orthostasis as outlined above.  Defer escalation of antihypertensive therapy with ongoing positive orthostatic vital signs.  Hold verapamil as outlined above.  Otherwise, she remains on carvedilol and lisinopril.  5. HLD: LDL 62 from 07/2020 with normal LFT at that time.  She remains on atorvastatin.  6. Sleep disordered breathing: Consider referral to pulmonology for sleep study in follow-up.  This was not discussed at today's visit.  7. PVD: Followed by wound clinic and vascular surgery at [facility].   Disposition: F/u with Dr. Rockey Situ to reestablish care in 2-3 months.   Medication Adjustments/Labs and Tests Ordered: Current medicines are reviewed at length with the patient today.  Concerns regarding medicines are outlined above. Medication changes, Labs and Tests ordered today are summarized above and listed in the Patient Instructions accessible in Encounters.   Signed, Christell Faith, PA-C 08/27/2020 2:38 PM     [address], [place] [phone]

## 2020-08-27 ENCOUNTER — Encounter: Payer: Self-pay | Admitting: Physician Assistant

## 2020-08-27 ENCOUNTER — Ambulatory Visit: Payer: Medicare HMO | Admitting: Physician Assistant

## 2020-08-27 ENCOUNTER — Other Ambulatory Visit: Payer: Self-pay

## 2020-08-27 VITALS — Ht 66.0 in | Wt 231.4 lb

## 2020-08-27 DIAGNOSIS — I10 Essential (primary) hypertension: Secondary | ICD-10-CM

## 2020-08-27 DIAGNOSIS — R55 Syncope and collapse: Secondary | ICD-10-CM | POA: Diagnosis not present

## 2020-08-27 DIAGNOSIS — E1159 Type 2 diabetes mellitus with other circulatory complications: Secondary | ICD-10-CM

## 2020-08-27 DIAGNOSIS — I472 Ventricular tachycardia: Secondary | ICD-10-CM

## 2020-08-27 DIAGNOSIS — Z87898 Personal history of other specified conditions: Secondary | ICD-10-CM

## 2020-08-27 DIAGNOSIS — I471 Supraventricular tachycardia: Secondary | ICD-10-CM

## 2020-08-27 DIAGNOSIS — I2510 Atherosclerotic heart disease of native coronary artery without angina pectoris: Secondary | ICD-10-CM | POA: Diagnosis not present

## 2020-08-27 DIAGNOSIS — I152 Hypertension secondary to endocrine disorders: Secondary | ICD-10-CM

## 2020-08-27 DIAGNOSIS — G4730 Sleep apnea, unspecified: Secondary | ICD-10-CM

## 2020-08-27 DIAGNOSIS — R42 Dizziness and giddiness: Secondary | ICD-10-CM | POA: Diagnosis not present

## 2020-08-27 DIAGNOSIS — I4729 Other ventricular tachycardia: Secondary | ICD-10-CM

## 2020-08-27 DIAGNOSIS — I739 Peripheral vascular disease, unspecified: Secondary | ICD-10-CM

## 2020-08-27 DIAGNOSIS — E785 Hyperlipidemia, unspecified: Secondary | ICD-10-CM

## 2020-08-27 NOTE — Patient Instructions (Signed)
Medication Instructions:  Your physician has recommended you make the following change in your medication:  1- STOP Verapamil.  *If you need a refill on your cardiac medications before your next appointment, please call your pharmacy*   Lab Work: Your physician recommends that you return for lab work in: [place].   If you have labs (blood work) drawn today and your tests are completely normal, you will receive your results only by: Marland Kitchen MyChart Message (if you have MyChart) OR . A paper copy in the mail If you have any lab test that is abnormal or we need to change your treatment, we will call you to review the results.   Testing/Procedures: none  Follow-Up: At [facility], you and your health needs are our priority.  As part of our continuing mission to provide you with exceptional heart care, we have created designated Provider Care Teams.  These Care Teams include your primary Cardiologist (physician) and Advanced Practice Providers (APPs -  Physician Assistants and Nurse Practitioners) who all work together to provide you with the care you need, when you need it.  We recommend signing up for the patient portal called "MyChart".  Sign up information is provided on this After Visit Summary.  MyChart is used to connect with patients for Virtual Visits (Telemedicine).  Patients are able to view lab/test results, encounter notes, upcoming appointments, etc.  Non-urgent messages can be sent to your provider as well.   To learn more about what you can do with MyChart, go to [email or web address].    Your next appointment:   2 month(s)  The format for your next appointment:   In Person  Provider:    You may see Ida Rogue, MD ONLY if possible - need to re-establish.

## 2020-08-28 LAB — BASIC METABOLIC PANEL
BUN/Creatinine Ratio: 24 (ref 12–28)
BUN: 25 mg/dL (ref 8–27)
CO2: 22 mmol/L (ref 20–29)
Calcium: 9.3 mg/dL (ref 8.7–10.3)
Chloride: 106 mmol/L (ref 96–106)
Creatinine, Ser: 1.04 mg/dL — ABNORMAL HIGH (ref 0.57–1.00)
GFR calc Af Amer: 66 mL/min/{1.73_m2} (ref >59)
GFR calc non Af Amer: 57 mL/min/{1.73_m2} — ABNORMAL LOW (ref >59)
Glucose: 125 mg/dL — ABNORMAL HIGH (ref 65–99)
Potassium: 4.9 mmol/L (ref 3.5–5.2)
Sodium: 141 mmol/L (ref 134–144)

## 2020-10-19 ENCOUNTER — Other Ambulatory Visit: Payer: Self-pay | Admitting: Family Medicine

## 2020-10-19 DIAGNOSIS — Z1231 Encounter for screening mammogram for malignant neoplasm of breast: Secondary | ICD-10-CM

## 2020-11-27 ENCOUNTER — Other Ambulatory Visit: Payer: Self-pay

## 2020-11-27 ENCOUNTER — Ambulatory Visit
Admission: RE | Admit: 2020-11-27 | Discharge: 2020-11-27 | Disposition: A | Discharge status: Home / Self Care | Payer: Medicare HMO | Source: Ambulatory Visit | Attending: Family Medicine | Admitting: Family Medicine

## 2020-11-27 DIAGNOSIS — Z1231 Encounter for screening mammogram for malignant neoplasm of breast: Secondary | ICD-10-CM | POA: Diagnosis present

## 2020-11-30 ENCOUNTER — Inpatient Hospital Stay
Admission: RE | Admit: 2020-11-30 | Discharge: 2020-11-30 | Disposition: A | Discharge status: Home / Self Care | Payer: Self-pay | Source: Ambulatory Visit | Attending: *Deleted | Admitting: *Deleted

## 2020-11-30 ENCOUNTER — Other Ambulatory Visit: Payer: Self-pay | Admitting: *Deleted

## 2020-11-30 DIAGNOSIS — Z1231 Encounter for screening mammogram for malignant neoplasm of breast: Secondary | ICD-10-CM

## 2020-12-06 NOTE — Progress Notes (Signed)
Cardiology Office Note  Date:  12/07/2020   ID:  Jasmine Davidson, DOB [birth year], MRN [id number]  PCP:  Theotis Burrow, MD   Chief Complaint  Patient presents with  . Follow-up    3 Months follow up. Medications verbally reviewed with patient.     HPI:  Jasmine Davidson is a 64 year old woman with past medical history of obesity,  syncope though secondary to vasovagal etiology managed with b-blockers,  mild to moderate CAD,  diabetes,  HTN,  Hyperlipidemia Last seen in clinic by myself March 2014  Seen by one of our providers September 2021 pSVT, NSVT, CKD stage III, COVID-19 in 11/2019, IDDM with diabetic retinopathy and neuropathy, HTN, HLD, PVD followed by [facility],  Who presents for follow-up of her coronary disease  extensive records reviewed: cardiac cath at [facility] in 2015 revealed 50% mid LAD stenosis with an FFR of 0.88, that was not hemodynamically significant, and LVEF of 60%.   nonobstructive renal and SFA disease with an LVEDP of 11 mmHg.    COVID-19 in 11/2019. Had vaccine since then  admitted to [facility] on 07/18/20 with severe dizziness and presyncope left-sided chest discomfort hypertensive upon arrival.    Orthostatic vitals were positive upon standing.    Echo demonstrated an EF of 65 to 70%, no regional wall motion abnormalities, grade 1 diastolic dysfunction, normal RV systolic function and ventricular cavity size, moderately dilated left atrium, and no significant valvular disease.   LHC on 07/21/2020 which showed moderate 1-vessel CAD with 40 to 50% mid LAD stenosis, minimal luminal irregularities involving the ramus intermedius and RCA   Mildly elevated LVEDP at 15 to 20 mmHg.  Medical therapy was recommended.  acute on CKD, her HCTZ was held. presyncope was represented by orthostasis versus vasovagal episode.    Outpatient cardiac monitoring demonstrated a predominant rhythm of sinus with an average heart rate of 74 bpm (range 57 to 110 bpm in sinus),  rare PACs and PVCs, 5 short runs of PSVT were noted with the longest interval lasting 7 beats with a maximum rate of 119 bpm.  Single 5 beat run of NSVT was noted with a maximum rate of 156 bpm, no sustained arrhythmias or prolonged pauses were noted.  On further discussion today, Has worsening  leg swelling, TED hose in place, Off lasix and HCTZ for several months Has been on her feet a lot recently  Weight down 10 pounds through dietary changes over the past year  Lab work reviewed HBA1C 5.9 CR 1, BUN normal  Carotid ultrasound and lower extremity arterial Dopplers reviewed with her in detail  Other past medical history reviewed History of retinal vein occlusion. numerous laser treatments.   Lab Results  Component Value Date   CHOL 108 07/20/2020   HDL 28 (L) 07/20/2020   LDLCALC 62 07/20/2020   TRIG 88 07/20/2020    PMH:   has a past medical history of CAD (coronary artery disease), CKD (chronic kidney disease) stage 3, GFR 30-59 ml/min (HCC), Diabetes mellitus, HTN (hypertension), and Obesity.  PSH:    Past Surgical History:  Procedure Laterality Date  . BLADDER REPAIR W/ CESAREAN SECTION  1981  . BREAST BIOPSY Right 12/17/2014   negative stereotactic  . CARDIAC CATHETERIZATION    . cyst taken off finger  2011  . LEFT HEART CATH AND CORONARY ANGIOGRAPHY N/A 07/21/2020   Procedure: LEFT HEART CATH AND CORONARY ANGIOGRAPHY;  Surgeon: Nelva Bush, MD;  Location: [facility];  Service: Cardiovascular;  Laterality: N/A;  . TOE SURGERY  02/08/2013    Current Outpatient Medications  Medication Sig Dispense Refill  . aspirin (ASPIRIN 81) 81 MG EC tablet Take 81 mg by mouth daily.    Marland Kitchen atorvastatin (LIPITOR) 40 MG tablet TAKE ONE TABLET BY MOUTH EVERY DAY (Patient taking differently: Take 40 mg by mouth at bedtime.) 30 tablet 11  . carvedilol (COREG) 12.5 MG tablet Take 1 tablet (12.5 mg total) by mouth 2 (two) times daily with a meal. 180 tablet 1  .  Cholecalciferol (EQL VITAMIN D3) 25 MCG (1000 UT) capsule Take 1,000 Units by mouth daily.    . empagliflozin (JARDIANCE) 10 MG TABS tablet Take 10 mg by mouth daily.    . Ferrous Sulfate (IRON) 325 (65 Fe) MG TABS Take by mouth.    . gabapentin (NEURONTIN) 300 MG capsule Take 300 mg by mouth 2 (two) times daily.     . insulin NPH-regular Human (70-30) 100 UNIT/ML injection Inject 5 Units into the skin 2 (two) times daily with a meal. Inject 12 u under the skin every morning and inject 10 u under the skin every night    . liraglutide (VICTOZA) 18 MG/3ML SOPN Inject 0.6 mg into the skin daily.    Marland Kitchen lisinopril (ZESTRIL) 20 MG tablet Take 1 tablet (20 mg total) by mouth daily. 30 tablet 0  . metFORMIN (GLUMETZA) 1000 MG (MOD) 24 hr tablet Take 1,000 mg by mouth 2 (two) times daily with a meal.    . vitamin B-12 (CYANOCOBALAMIN) 1000 MCG tablet Take 1,000 mcg by mouth daily.     No current facility-administered medications for this visit.    Allergies:   Pregabalin   Social History:  The patient  reports that she has never smoked. She has never used smokeless tobacco. She reports that she does not drink alcohol and does not use drugs.   Family History:   family history includes COPD in her mother; Diabetes in her father; Heart failure in her father; Hypertension in her father; Lupus in her father.   Review of Systems: Review of Systems  Constitutional: Negative.   HENT: Negative.   Respiratory: Negative.   Cardiovascular: Negative.   Gastrointestinal: Negative.   Musculoskeletal: Negative.   Neurological: Negative.   Psychiatric/Behavioral: Negative.   All other systems reviewed and are negative.   PHYSICAL EXAM: VS:  BP (!) 154/86 (BP Location: Left Arm, Patient Position: Sitting, Cuff Size: Normal)   Pulse 72   Ht 5\' 6"  (1.676 m)   Wt 226 lb (102.5 kg)   LMP 09/12/1999   SpO2 98%   BMI 36.48 kg/m  , BMI Body mass index is 36.48 kg/m. GEN: Well nourished, well developed, in no  acute distress HEENT: normal Neck: no JVD, carotid bruits, or masses Cardiac: RRR; no murmurs, rubs, or gallops,no edema  Respiratory:  clear to auscultation bilaterally, normal work of breathing GI: soft, nontender, nondistended, + BS MS: no deformity or atrophy Skin: warm and dry, no rash Neuro:  Strength and sensation are intact Psych: euthymic mood, full affect   Recent Labs: 07/18/2020: ALT 13 07/19/2020: TSH 1.275 07/20/2020: Magnesium 2.1 07/21/2020: Hemoglobin 12.3; Platelets 151 08/27/2020: BUN 25; Creatinine, Ser 1.04; Potassium 4.9; Sodium 141    Lipid Panel Lab Results  Component Value Date   CHOL 108 07/20/2020   HDL 28 (L) 07/20/2020   LDLCALC 62 07/20/2020   TRIG 88 07/20/2020      Wt Readings from Last 3 Encounters:  12/07/20  226 lb (102.5 kg)  08/27/20 231 lb 6.4 oz (105 kg)  07/21/20 236 lb 4.8 oz (107.2 kg)      ASSESSMENT AND PLAN:  Problem List Items Addressed This Visit      Cardiology Problems   Hypertension associated with diabetes ([place])    Other Visit Diagnoses    Coronary artery disease of native artery of native heart with stable angina pectoris ([place])    -  Primary   Postural dizziness with presyncope       Paroxysmal SVT (supraventricular tachycardia) (HCC)       NSVT (nonsustained ventricular tachycardia) (HCC)       Hyperlipidemia LDL goal <70       Sleep-disordered breathing       PVD (peripheral vascular disease) ([place])         CAD  Currently with no symptoms of angina. No further workup at this time. Continue current medication regimen.  Leg edema Seems to be worsening over the past several weeks Off all diuretics given prior history of orthostasis Given normal renal function, blood pressure running high, recommended Lasix 20 mg daily as needed , no more than 2x a week Moderate fluid intake and salt intake She is wearing compression hose  Hyperlipidemia Cholesterol is at goal on the current lipid regimen. No changes to the  medications were made.  PAD: Nonobstructive carotid disease Cholesterol at goal,  Discussed LE arterial study, will repeat early 2022 Difficult to interpret lower extremity arterial Doppler done at [facility], visualize the left SFA well Had carotid 03/2020 Recommend repeat extremity arterial Doppler in 2022 We will try to avoid contrast from CT    Total encounter time more than 45 minutes  Greater than 50% was spent in counseling and coordination of care with the patient    Signed, Esmond Plants, M.D., Ph.D. [place], [facility]

## 2020-12-07 ENCOUNTER — Encounter: Payer: Self-pay | Admitting: Cardiovascular Disease

## 2020-12-07 ENCOUNTER — Ambulatory Visit: Payer: Medicare HMO | Admitting: Cardiovascular Disease

## 2020-12-07 ENCOUNTER — Other Ambulatory Visit: Payer: Self-pay

## 2020-12-07 VITALS — BP 154/86 | HR 72 | Ht 66.0 in | Wt 226.0 lb

## 2020-12-07 DIAGNOSIS — R55 Syncope and collapse: Secondary | ICD-10-CM

## 2020-12-07 DIAGNOSIS — I25118 Atherosclerotic heart disease of native coronary artery with other forms of angina pectoris: Secondary | ICD-10-CM | POA: Diagnosis not present

## 2020-12-07 DIAGNOSIS — G4730 Sleep apnea, unspecified: Secondary | ICD-10-CM

## 2020-12-07 DIAGNOSIS — R42 Dizziness and giddiness: Secondary | ICD-10-CM

## 2020-12-07 DIAGNOSIS — I152 Hypertension secondary to endocrine disorders: Secondary | ICD-10-CM

## 2020-12-07 DIAGNOSIS — I471 Supraventricular tachycardia: Secondary | ICD-10-CM | POA: Diagnosis not present

## 2020-12-07 DIAGNOSIS — I472 Ventricular tachycardia: Secondary | ICD-10-CM

## 2020-12-07 DIAGNOSIS — I739 Peripheral vascular disease, unspecified: Secondary | ICD-10-CM

## 2020-12-07 DIAGNOSIS — I4729 Other ventricular tachycardia: Secondary | ICD-10-CM

## 2020-12-07 DIAGNOSIS — E785 Hyperlipidemia, unspecified: Secondary | ICD-10-CM

## 2020-12-07 DIAGNOSIS — E1159 Type 2 diabetes mellitus with other circulatory complications: Secondary | ICD-10-CM

## 2020-12-07 NOTE — Patient Instructions (Addendum)
Medication Instructions:  Lasix 20 mg daily as needed for leg swelling No more than 2  times a week  If you need a refill on your cardiac medications before your next appointment, please call your pharmacy.    Lab work: No new labs needed   If you have labs (blood work) drawn today and your tests are completely normal, you will receive your results only by:  [place] (if you have MyChart) OR  A paper copy in the mail If you have any lab test that is abnormal or we need to change your treatment, we will call you to review the results.   Testing/Procedures: No new testing needed   Follow-Up: At [facility], you and your health needs are our priority.  As part of our continuing mission to provide you with exceptional heart care, we have created designated Provider Care Teams.  These Care Teams include your primary Cardiologist (physician) and Advanced Practice Providers (APPs -  Physician Assistants and Nurse Practitioners) who all work together to provide you with the care you need, when you need it.   You will need a follow up appointment in 12 months   Providers on your designated Care Team:    Murray Hodgkins, NP  Christell Faith, PA-C  Marrianne Mood, PA-C  Any Other Special Instructions Will Be Listed Below (If Applicable).  COVID-19 Vaccine Information can be found at: [email or web address].uk For questions related to vaccine distribution or appointments, please email [email or web address] or call [phone].

## 2021-01-19 ENCOUNTER — Telehealth: Payer: Self-pay | Admitting: Cardiovascular Disease

## 2021-01-19 MED ORDER — CARVEDILOL 12.5 MG PO TABS: 12.5000 mg | ORAL_TABLET | Freq: Two times a day (BID) | ORAL | 1 refills | Status: DC

## 2021-01-19 NOTE — Telephone Encounter (Signed)
*  STAT* If patient is at the pharmacy, call can be transferred to refill team.   1. Which medications need to be refilled? (please list name of each medication and dose if known) carvedilol   2. Which pharmacy/location (including street and city if local pharmacy) is medication to be sent to? [facility]  3. Do they need a 30 day or 90 day supply? [place]

## 2021-03-26 ENCOUNTER — Other Ambulatory Visit: Admission: RE | Admit: 2021-03-26 | Payer: Medicare HMO | Source: Ambulatory Visit

## 2021-03-29 ENCOUNTER — Encounter: Payer: Self-pay | Admitting: *Deleted

## 2021-03-30 ENCOUNTER — Ambulatory Visit: Payer: Medicare HMO | Admitting: Anesthesiology

## 2021-03-30 ENCOUNTER — Ambulatory Visit
Admission: RE | Admit: 2021-03-30 | Discharge: 2021-03-30 | Disposition: A | Discharge status: Home / Self Care | Payer: Medicare HMO | Attending: Gastroenterology | Admitting: Gastroenterology

## 2021-03-30 ENCOUNTER — Encounter: Payer: Self-pay | Admitting: Anesthesiology

## 2021-03-30 ENCOUNTER — Encounter
Admission: RE | Disposition: A | Discharge status: Home / Self Care | Payer: Self-pay | Source: Home / Self Care | Attending: Gastroenterology

## 2021-03-30 DIAGNOSIS — Z794 Long term (current) use of insulin: Secondary | ICD-10-CM | POA: Diagnosis not present

## 2021-03-30 DIAGNOSIS — R1310 Dysphagia, unspecified: Secondary | ICD-10-CM | POA: Diagnosis not present

## 2021-03-30 DIAGNOSIS — N183 Chronic kidney disease, stage 3 unspecified: Secondary | ICD-10-CM | POA: Diagnosis not present

## 2021-03-30 DIAGNOSIS — Z7982 Long term (current) use of aspirin: Secondary | ICD-10-CM | POA: Diagnosis not present

## 2021-03-30 DIAGNOSIS — Z7984 Long term (current) use of oral hypoglycemic drugs: Secondary | ICD-10-CM | POA: Diagnosis not present

## 2021-03-30 DIAGNOSIS — Z1211 Encounter for screening for malignant neoplasm of colon: Secondary | ICD-10-CM | POA: Diagnosis not present

## 2021-03-30 DIAGNOSIS — K5730 Diverticulosis of large intestine without perforation or abscess without bleeding: Secondary | ICD-10-CM | POA: Diagnosis not present

## 2021-03-30 DIAGNOSIS — E1122 Type 2 diabetes mellitus with diabetic chronic kidney disease: Secondary | ICD-10-CM | POA: Diagnosis not present

## 2021-03-30 DIAGNOSIS — I129 Hypertensive chronic kidney disease with stage 1 through stage 4 chronic kidney disease, or unspecified chronic kidney disease: Secondary | ICD-10-CM | POA: Diagnosis not present

## 2021-03-30 DIAGNOSIS — K222 Esophageal obstruction: Secondary | ICD-10-CM | POA: Insufficient documentation

## 2021-03-30 DIAGNOSIS — Z79899 Other long term (current) drug therapy: Secondary | ICD-10-CM | POA: Insufficient documentation

## 2021-03-30 HISTORY — PX: COLONOSCOPY WITH PROPOFOL: SHX5780

## 2021-03-30 HISTORY — PX: ESOPHAGOGASTRODUODENOSCOPY (EGD) WITH PROPOFOL: SHX5813

## 2021-03-30 HISTORY — DX: Hyperlipidemia, unspecified: E78.5

## 2021-03-30 LAB — GLUCOSE, CAPILLARY: Glucose-Capillary: 147 mg/dL — ABNORMAL HIGH (ref 70–99)

## 2021-03-30 SURGERY — COLONOSCOPY WITH PROPOFOL
Anesthesia: General

## 2021-03-30 MED ORDER — SODIUM CHLORIDE 0.9 % IV SOLN: INTRAVENOUS | Status: DC

## 2021-03-30 MED ORDER — PROPOFOL 500 MG/50ML IV EMUL
INTRAVENOUS | Status: AC
  Filled 2021-03-30: qty 50

## 2021-03-30 MED ORDER — LIDOCAINE HCL (CARDIAC) PF 100 MG/5ML IV SOSY
PREFILLED_SYRINGE | INTRAVENOUS | Status: DC | PRN
  Administered 2021-03-30: 50 mg via INTRAVENOUS

## 2021-03-30 MED ORDER — EPHEDRINE 5 MG/ML INJ
INTRAVENOUS | Status: AC
  Filled 2021-03-30: qty 10

## 2021-03-30 MED ORDER — LIDOCAINE HCL (PF) 2 % IJ SOLN
INTRAMUSCULAR | Status: AC
  Filled 2021-03-30: qty 5

## 2021-03-30 MED ORDER — PROPOFOL 500 MG/50ML IV EMUL
INTRAVENOUS | Status: DC | PRN
  Administered 2021-03-30: 120 ug/kg/min via INTRAVENOUS

## 2021-03-30 MED ORDER — PROPOFOL 10 MG/ML IV BOLUS
INTRAVENOUS | Status: AC
  Filled 2021-03-30: qty 20

## 2021-03-30 MED ORDER — EPHEDRINE SULFATE 50 MG/ML IJ SOLN
INTRAMUSCULAR | Status: DC | PRN
  Administered 2021-03-30 (×2): 10 mg via INTRAVENOUS

## 2021-03-30 NOTE — H&P (Signed)
Outpatient short stay form Pre-procedure 03/30/2021 9:14 AM Jasmine Miyamoto MD, MPH  Primary Physician: Dr. Alene Mires  Reason for visit:  Dysphagia/Dark stool/screening  History of present illness:   65 y/o lady with history of DM II here for EGD for dysphagia to occasional solids and pills. Has been going on for one year and not progressive. Had dark stools a while ago which resolved. This is her index colonoscopy for screening. No family history of GI malignancies.    Current Facility-Administered Medications:  .  0.9 %  sodium chloride infusion, , Intravenous, Continuous, [name], [name], MD, Last Rate: 20 mL/hr at 03/30/21 0909, New Bag at 03/30/21 0909  Medications Prior to Admission  Medication Sig Dispense Refill Last Dose  . aspirin (ASPIRIN 81) 81 MG EC tablet Take 81 mg by mouth daily.   Past Week at Unknown time  . carvedilol (COREG) 12.5 MG tablet Take 1 tablet (12.5 mg total) by mouth 2 (two) times daily with a meal. 180 tablet 1 03/30/2021 at Unknown time  . empagliflozin (JARDIANCE) 10 MG TABS tablet Take 10 mg by mouth daily.   03/29/2021 at Unknown time  . Ferrous Sulfate (IRON) 325 (65 Fe) MG TABS Take by mouth.   Past Week at Unknown time  . furosemide (LASIX) 20 MG tablet Take 20 mg by mouth daily as needed.   Past Week at Unknown time  . gabapentin (NEURONTIN) 300 MG capsule Take 300 mg by mouth 2 (two) times daily.    03/29/2021 at Unknown time  . insulin NPH-regular Human (70-30) 100 UNIT/ML injection Inject 5 Units into the skin 2 (two) times daily with a meal. Inject 12 u under the skin every morning and inject 10 u under the skin every night   03/29/2021 at Unknown time  . liraglutide (VICTOZA) 18 MG/3ML SOPN Inject 0.6 mg into the skin daily.   Past Week at Unknown time  . lisinopril (ZESTRIL) 20 MG tablet Take 1 tablet (20 mg total) by mouth daily. 30 tablet 0 03/30/2021 at Unknown time  . metFORMIN (GLUMETZA) 1000 MG (MOD) 24 hr tablet Take 1,000 mg by mouth 2 (two)  times daily with a meal.   Past Week at Unknown time  . atorvastatin (LIPITOR) 40 MG tablet TAKE ONE TABLET BY MOUTH EVERY DAY (Patient taking differently: Take 40 mg by mouth at bedtime.) 30 tablet 11   . Cholecalciferol (EQL VITAMIN D3) 25 MCG (1000 UT) capsule Take 1,000 Units by mouth daily.     . vitamin B-12 (CYANOCOBALAMIN) 1000 MCG tablet Take 1,000 mcg by mouth daily.        Allergies  Allergen Reactions  . Pregabalin      Past Medical History:  Diagnosis Date  . CAD (coronary artery disease)   . CKD (chronic kidney disease) stage 3, GFR 30-59 ml/min (HCC)   . Diabetes mellitus   . HTN (hypertension)   . Hyperlipidemia   . Obesity     Review of systems:  Otherwise negative.    Physical Exam  Gen: Alert, oriented. Appears stated age.  HEENT: PERRLA. Lungs: No respiratory distress CV: RRR Abd: soft, benign, no masses Ext: No edema    Planned procedures: Proceed with EGD/colonoscopy. The patient understands the nature of the planned procedure, indications, risks, alternatives and potential complications including but not limited to bleeding, infection, perforation, damage to internal organs and possible oversedation/side effects from anesthesia. The patient agrees and gives consent to proceed.  Please refer to procedure notes for  findings, recommendations and patient disposition/instructions.     Jasmine Miyamoto MD, MPH Gastroenterology 03/30/2021  9:14 AM

## 2021-03-30 NOTE — Op Note (Signed)
[facility] Patient Name: Jasmine Davidson Procedure Date: 03/30/2021 8:53 AM MRN: [id number] Account #: [id number] Date of Birth: 1956/05/11 Admit Type: Outpatient Age: 65 Room: [facility] ENDO ROOM 1 Gender: Female Note Status: Finalized Procedure:             Upper GI endoscopy Indications:           Dysphagia Providers:             Andrey Farmer MD, MD Medicines:             Monitored Anesthesia Care Complications:         No immediate complications. Estimated blood loss:                         Minimal. Procedure:             Pre-Anesthesia Assessment:                        - Prior to the procedure, a History and Physical was                         performed, and patient medications and allergies were                         reviewed. The patient is competent. The risks and                         benefits of the procedure and the sedation options and                         risks were discussed with the patient. All questions                         were answered and informed consent was obtained.                         Patient identification and proposed procedure were                         verified by the physician, the nurse, the anesthetist                         and the technician in the endoscopy suite. Mental                         Status Examination: alert and oriented. Airway                         Examination: normal oropharyngeal airway and neck                         mobility. Respiratory Examination: clear to                         auscultation. CV Examination: normal. Prophylactic                         Antibiotics: The patient does not require prophylactic  antibiotics. Prior Anticoagulants: The patient has                         taken no previous anticoagulant or antiplatelet                         agents. ASA Grade Assessment: II - A patient with mild                         systemic disease.  After reviewing the risks and                         benefits, the patient was deemed in satisfactory                         condition to undergo the procedure. The anesthesia                         plan was to use monitored anesthesia care (MAC).                         Immediately prior to administration of medications,                         the patient was re-assessed for adequacy to receive                         sedatives. The heart rate, respiratory rate, oxygen                         saturations, blood pressure, adequacy of pulmonary                         ventilation, and response to care were monitored                         throughout the procedure. The physical status of the                         patient was re-assessed after the procedure.                        After obtaining informed consent, the endoscope was                         passed under direct vision. Throughout the procedure,                         the patient's blood pressure, pulse, and oxygen                         saturations were monitored continuously. The Endoscope                         was introduced through the mouth, and advanced to the                         second part of duodenum. The upper GI endoscopy was  accomplished without difficulty. The patient tolerated                         the procedure well. Findings:      A non-obstructing Schatzki ring was found in the lower third of the       esophagus. A TTS dilator was passed through the scope. Dilation with a       15-16.5-18 mm balloon dilator was performed to 18 mm. The dilation site       was examined and showed mild mucosal disruption. Estimated blood loss       was minimal.      Biopsies were taken with a cold forceps in the entire esophagus for       histology. Estimated blood loss was minimal.      The exam of the esophagus was otherwise normal.      The entire examined stomach was normal. Biopsies  were taken with a cold       forceps for Helicobacter pylori testing. Estimated blood loss was       minimal.      The examined duodenum was normal. Impression:            - Non-obstructing Schatzki ring. Dilated.                        - Normal stomach. Biopsied.                        - Normal examined duodenum.                        - Biopsies were taken with a cold forceps for                         histology in the entire esophagus. Recommendation:        - Perform a colonoscopy today. Procedure Code(s):     --- Professional ---                        [phone], Esophagogastroduodenoscopy, flexible,                         transoral; with transendoscopic balloon dilation of                         esophagus (less than 30 mm diameter) Diagnosis Code(s):     --- Professional ---                        K22.2, Esophageal obstruction                        R13.10, Dysphagia, unspecified CPT copyright 2019 American Medical Association. All rights reserved. The codes documented in this report are preliminary and upon coder review may  be revised to meet current compliance requirements. Andrey Farmer MD, MD 03/30/2021 9:52:50 AM Number of Addenda: 0 Note Initiated On: 03/30/2021 8:53 AM Estimated Blood Loss:  Estimated blood loss was minimal.      [facility]

## 2021-03-30 NOTE — Anesthesia Preprocedure Evaluation (Signed)
Anesthesia Evaluation  Patient identified by MRN, date of birth, ID band Patient awake    Reviewed: Allergy & Precautions, H&P , NPO status , Patient's Chart, lab work & pertinent test results, reviewed documented beta blocker date and time   Airway Mallampati: III   Neck ROM: full    Dental  (+) Teeth Intact   Pulmonary neg pulmonary ROS,    Pulmonary exam normal        Cardiovascular Exercise Tolerance: Good hypertension, On Medications + CAD and + Past MI  Normal cardiovascular exam Rhythm:regular Rate:Normal     Neuro/Psych negative neurological ROS  negative psych ROS   GI/Hepatic negative GI ROS, Neg liver ROS,   Endo/Other  negative endocrine ROSdiabetes  Renal/GU Renal disease  negative genitourinary   Musculoskeletal   Abdominal   Peds  Hematology negative hematology ROS (+)   Anesthesia Other Findings Past Medical History: No date: CAD (coronary artery disease) No date: CKD (chronic kidney disease) stage 3, GFR 30-59 ml/min (HCC) No date: Diabetes mellitus No date: HTN (hypertension) No date: Hyperlipidemia No date: Obesity Past Surgical History: 1981: BLADDER REPAIR W/ CESAREAN SECTION 12/17/2014: BREAST BIOPSY; Right     Comment:  negative stereotactic No date: CARDIAC CATHETERIZATION 2011: cyst taken off finger 07/21/2020: LEFT HEART CATH AND CORONARY ANGIOGRAPHY; N/A     Comment:  Procedure: LEFT HEART CATH AND CORONARY ANGIOGRAPHY;                Surgeon: Nelva Bush, MD;  Location: [place]               CV LAB;  Service: Cardiovascular;  Laterality: N/A; 02/08/2013: TOE SURGERY BMI    Body Mass Index: 34.22 kg/m     Reproductive/Obstetrics negative OB ROS                             Anesthesia Physical Anesthesia Plan  ASA: III  Anesthesia Plan: General   Post-op Pain Management:    Induction:   PONV Risk Score and Plan:   Airway Management  Planned:   Additional Equipment:   Intra-op Plan:   Post-operative Plan:   Informed Consent: I have reviewed the patients History and Physical, chart, labs and discussed the procedure including the risks, benefits and alternatives for the proposed anesthesia with the patient or authorized representative who has indicated his/her understanding and acceptance.     Dental Advisory Given  Plan Discussed with: CRNA  Anesthesia Plan Comments:         Anesthesia Quick Evaluation

## 2021-03-30 NOTE — Transfer of Care (Signed)
Immediate Anesthesia Transfer of Care Note  Patient: Jasmine Davidson  Procedure(s) Performed: COLONOSCOPY WITH PROPOFOL (N/A ) ESOPHAGOGASTRODUODENOSCOPY (EGD) WITH PROPOFOL (N/A )  Patient Location: PACU  Anesthesia Type:General  Level of Consciousness: awake and sedated  Airway & Oxygen Therapy: Patient Spontanous Breathing and Patient connected to nasal cannula oxygen  Post-op Assessment: Report given to RN and Post -op Vital signs reviewed and stable  Post vital signs: Reviewed and stable  Last Vitals:  Vitals Value Taken Time  BP    Temp    Pulse    Resp    SpO2      Last Pain:  Vitals:   03/30/21 0854  TempSrc: Temporal  PainSc: 0-No pain         Complications: No complications documented.

## 2021-03-30 NOTE — Anesthesia Procedure Notes (Signed)
Performed by: Cook-Martin, Pamula Luther Pre-anesthesia Checklist: Patient identified, Emergency Drugs available, Suction available, Patient being monitored and Timeout performed Patient Re-evaluated:Patient Re-evaluated prior to induction Oxygen Delivery Method: Nasal cannula Preoxygenation: Pre-oxygenation with 100% oxygen Induction Type: IV induction Airway Equipment and Method: Bite block Placement Confirmation: positive ETCO2 and CO2 detector       

## 2021-03-30 NOTE — Op Note (Signed)
[facility] Gastroenterology Patient Name: Jasmine Davidson Procedure Date: 03/30/2021 8:52 AM MRN: [id number] Account #: [id number] Date of Birth: Dec 21, 1955 Admit Type: Outpatient Age: 65 Room: [facility] ENDO ROOM 1 Gender: Female Note Status: Finalized Procedure:             Colonoscopy Indications:           Screening for colorectal malignant neoplasm Providers:             Andrey Farmer MD, MD Medicines:             Monitored Anesthesia Care Complications:         No immediate complications. Procedure:             Pre-Anesthesia Assessment:                        - Prior to the procedure, a History and Physical was                         performed, and patient medications and allergies were                         reviewed. The patient is competent. The risks and                         benefits of the procedure and the sedation options and                         risks were discussed with the patient. All questions                         were answered and informed consent was obtained.                         Patient identification and proposed procedure were                         verified by the physician, the nurse, the anesthetist                         and the technician in the endoscopy suite. Mental                         Status Examination: alert and oriented. Airway                         Examination: normal oropharyngeal airway and neck                         mobility. Respiratory Examination: clear to                         auscultation. CV Examination: normal. Prophylactic                         Antibiotics: The patient does not require prophylactic                         antibiotics. Prior Anticoagulants: The patient has  taken no previous anticoagulant or antiplatelet                         agents. ASA Grade Assessment: II - A patient with mild                         systemic disease. After reviewing the risks and                          benefits, the patient was deemed in satisfactory                         condition to undergo the procedure. The anesthesia                         plan was to use monitored anesthesia care (MAC).                         Immediately prior to administration of medications,                         the patient was re-assessed for adequacy to receive                         sedatives. The heart rate, respiratory rate, oxygen                         saturations, blood pressure, adequacy of pulmonary                         ventilation, and response to care were monitored                         throughout the procedure. The physical status of the                         patient was re-assessed after the procedure.                        After obtaining informed consent, the colonoscope was                         passed under direct vision. Throughout the procedure,                         the patient's blood pressure, pulse, and oxygen                         saturations were monitored continuously. The                         Colonoscope was introduced through the anus and                         advanced to the the cecum, identified by appendiceal                         orifice and ileocecal valve. The colonoscopy was  performed without difficulty. The patient tolerated                         the procedure well. The quality of the bowel                         preparation was good. Findings:      The perianal and digital rectal examinations were normal.      A single small-mouthed diverticulum was found in the sigmoid colon.      The exam was otherwise without abnormality on direct and retroflexion       views. Impression:            - Diverticulosis in the sigmoid colon.                        - The examination was otherwise normal on direct and                         retroflexion views.                        - No specimens  collected. Recommendation:        - Discharge patient to home.                        - Resume previous diet.                        - Continue present medications.                        - Repeat colonoscopy in 10 years for screening                         purposes.                        - Return to referring physician as previously                         scheduled. Procedure Code(s):     --- Professional ---                        M5465, Colorectal cancer screening; colonoscopy on                         individual not meeting criteria for high risk Diagnosis Code(s):     --- Professional ---                        Z12.11, Encounter for screening for malignant neoplasm                         of colon                        K57.30, Diverticulosis of large intestine without                         perforation or abscess without bleeding CPT copyright 2019 American Medical Association. All rights reserved. The  codes documented in this report are preliminary and upon coder review may  be revised to meet current compliance requirements. Andrey Farmer MD, MD 03/30/2021 9:55:43 AM Number of Addenda: 0 Note Initiated On: 03/30/2021 8:52 AM Scope Withdrawal Time: 0 hours 6 minutes 31 seconds  Total Procedure Duration: 0 hours 10 minutes 20 seconds  Estimated Blood Loss:  Estimated blood loss: none.      [facility]

## 2021-03-30 NOTE — Interval H&P Note (Signed)
History and Physical Interval Note:  03/30/2021 9:17 AM  Jasmine Davidson  has presented today for surgery, with the diagnosis of DYSPHAGIA SCREENING.  The various methods of treatment have been discussed with the patient and family. After consideration of risks, benefits and other options for treatment, the patient has consented to  Procedure(s) with comments: COLONOSCOPY WITH PROPOFOL (N/A) - DM ESOPHAGOGASTRODUODENOSCOPY (EGD) WITH PROPOFOL (N/A) as a surgical intervention.  The patient's history has been reviewed, patient examined, no change in status, stable for surgery.  I have reviewed the patient's chart and labs.  Questions were answered to the patient's satisfaction.     Lesly Rubenstein  Ok to proceed with EGD/Colonoscopy

## 2021-03-31 ENCOUNTER — Encounter: Payer: Self-pay | Admitting: Gastroenterology

## 2021-03-31 LAB — SURGICAL PATHOLOGY

## 2021-03-31 NOTE — Anesthesia Postprocedure Evaluation (Signed)
Anesthesia Post Note  Patient: Jasmine Davidson  Procedure(s) Performed: COLONOSCOPY WITH PROPOFOL (N/A ) ESOPHAGOGASTRODUODENOSCOPY (EGD) WITH PROPOFOL (N/A )  Patient location during evaluation: PACU Anesthesia Type: General Level of consciousness: awake and alert Pain management: pain level controlled Vital Signs Assessment: post-procedure vital signs reviewed and stable Respiratory status: spontaneous breathing, nonlabored ventilation, respiratory function stable and patient connected to nasal cannula oxygen Cardiovascular status: blood pressure returned to baseline and stable Postop Assessment: no apparent nausea or vomiting Anesthetic complications: no   No complications documented.   Last Vitals:  Vitals:   03/30/21 0903 03/30/21 1011  BP: 91/61 133/77  Pulse: 64   Resp:    Temp: (!) 36.1 C   SpO2:      Last Pain:  Vitals:   03/31/21 0730  TempSrc:   PainSc: 0-No pain                 Molli Barrows

## 2021-06-08 ENCOUNTER — Telehealth: Payer: Self-pay | Admitting: Cardiovascular Disease

## 2021-06-08 NOTE — Telephone Encounter (Signed)
Pt c/o of Chest Pain: STAT if CP now or developed within 24 hours  1. Are you having CP right now? No, not right now - last Wednesday 06/22  2. Are you experiencing any other symptoms (ex. SOB, nausea, vomiting, sweating)? Head hurts, sometimes dizzy   3. How long have you been experiencing CP? Noticed it the worst on Wednesday    4. Is your CP continuous or coming and going? Comes and goes   5. Have you taken Nitroglycerin? no ? Patient calling, has noticed some chest pain and just not feeling well.  Wants to come in, next available 07/13.  Please call to discuss.

## 2021-06-08 NOTE — Telephone Encounter (Signed)
Was able to return pt's phone call regarding her not feeling well and her dizzy spell last Wed. She reports she was eating breakfast last Wed, bacon and OJ when she got dizzy, headache, and nausea. Since then she has not felt good, continues headache with head pressure, occasional chest pressure, weakness, and reports her BP has been low.   Friday 83/61 Monday 99/74 Today 100/65 Random 89/68 Reports do not remember the HR.   Advised her BP are low, encourage to hydrate, pt reports drinks water all the time, advised that she keep her upcoming appt with Christell Faith, PA-C on 7/13 and also suggested to call her PCP regarding her symptoms, especially the continuous headache/pressure even after doses of extra strength tylenol.   Also encourage if BP still low, don't take her dose of lisinopril in the am or the coreg as this can make BP lower. Pt verbalized understanding. If still low and still feel weak, not well, headaches, then seek the ED for further evaluation. Pt verbalized understanding.  Pt question if another echo is needed, last echo was 07/2020 and results good, advised this could be discuss at her f/u appt as things can change in a year and hx of COIVD in the past, definitely something to consider.   Otherwise all questions or concerns were address and no additional concerns at this time. Agreeable to plan, will call back for anything further.

## 2021-06-18 NOTE — Progress Notes (Deleted)
Cardiology Office Note    Date:  06/18/2021   ID:  Jasmine Davidson, DOB 08-22-1956, MRN [id number]  PCP:  Theotis Burrow, MD  Cardiologist:  Ida Rogue, MD  Electrophysiologist:  None   Chief Complaint: Follow up  History of Present Illness:   Jasmine Davidson is a 65 y.o. female with history of nonobstructive CAD as outlined below, presyncope in the setting of orthostasis versus vagal episode, pSVT, NSVT, CKD stage III, COVID-19 in 11/2019, IDDM with diabetic retinopathy and neuropathy, HTN, HLD, PVD followed by [facility], and venous insufficiency who presents for ***.   She previously underwent cardiac cath at [facility] in 2015 revealed 50% mid LAD stenosis with an FFR of 0.88, that was not hemodynamically significant, and LVEF of 60%.  She was also noted to have nonobstructive renal and SFA disease with an LVEDP of 11 mmHg.  She was diagnosed with COVID-19 in 11/2019.   She was admitted to [facility] in 07/2020 with dizziness and presyncope as well as a several week history of left-sided chest discomfort that was not occurring with her dizziness and occurred mostly with exertion.  Overall, she had generally been feeling fatigued leading up to her admission and was having to stop and take breaks to do routine housework.  HS-Tn peaked at 70.  Orthostatic vitals were positive.  Echo demonstrated an EF of 65 to 70%, no regional wall motion abnormalities, grade 1 diastolic dysfunction, normal RV systolic function and ventricular cavity size, moderately dilated left atrium, and no significant valvular disease.  She underwent diagnostic LHC on 07/21/2020 which showed moderate 1-vessel CAD with 40 to 50% mid LAD stenosis, minimal luminal irregularities involving the ramus intermedius and RCA were also noted.  Mildly elevated LVEDP at 15 to 20 mmHg.  Medical therapy was recommended.  There were no significant arrhythmias or prolonged pauses noted on telemetry.  With acute on CKD, her HCTZ was held.  Outpatient  cardiac monitoring demonstrated a predominant rhythm of sinus with an average heart rate of 74 bpm (range 57 to 110 bpm in sinus), rare PACs and PVCs, 5 short runs of PSVT were noted with the longest interval lasting 7 beats with a maximum rate of 119 bpm.  Single 5 beat run of NSVT was noted with a maximum rate of 156 bpm, no sustained arrhythmias or prolonged pauses were noted.  Lower extremity arterial ultrasound/ABI at [facility] in 04/2020 showed essentially unchanged ABIs bilaterally when compared to prior exam in 2016 along with very possible mild right PTA obstruction as well as possible obstruction involving the left SFA and/or popliteal artery.  Pletal was discontinued with noted possible melanotic stools.  She was last seen in the office in 11/2020 noting worsening lower extremity swelling while being off Lasix and HCTZ for several months.  Her weight was down 10 pounds through dietary changes over the preceding year.  She was started on Lasix 20 mg as needed.  She contacted our office on 06/08/2021 noting an episode of dizziness, headache, and nausea after eating breakfast.  Note indicates that since that time she "has not felt good."  She noted continued headache, weakness, and occasional chest pressure.  BP was soft ranging from 83-100/61-74.   ***   Labs independently reviewed: 03/2021 - potassium 5.0, BUN 26, serum creatinine 1.06, A1c 5.6, Hgb 13.2, PLT 123 07/2020 - albumin 3.3, TC 108, TG 88, HDL 28, LDL 62, magnesium 2.1, TSH normal, A1c 5.8, AST/ALT normal  Past Medical History:  Diagnosis  Date   CAD (coronary artery disease)    CKD (chronic kidney disease) stage 3, GFR 30-59 ml/min (HCC)    Diabetes mellitus    HTN (hypertension)    Hyperlipidemia    Obesity     Past Surgical History:  Procedure Laterality Date   BLADDER REPAIR W/ CESAREAN SECTION  1981   BREAST BIOPSY Right 12/17/2014   negative stereotactic   CARDIAC CATHETERIZATION     COLONOSCOPY WITH PROPOFOL N/A  03/30/2021   Procedure: COLONOSCOPY WITH PROPOFOL;  Surgeon: Lesly Rubenstein, MD;  Location: [facility] ENDOSCOPY;  Service: Endoscopy;  Laterality: N/A;  DM   cyst taken off finger  2011   ESOPHAGOGASTRODUODENOSCOPY (EGD) WITH PROPOFOL N/A 03/30/2021   Procedure: ESOPHAGOGASTRODUODENOSCOPY (EGD) WITH PROPOFOL;  Surgeon: Lesly Rubenstein, MD;  Location: [facility] ENDOSCOPY;  Service: Endoscopy;  Laterality: N/A;   LEFT HEART CATH AND CORONARY ANGIOGRAPHY N/A 07/21/2020   Procedure: LEFT HEART CATH AND CORONARY ANGIOGRAPHY;  Surgeon: Nelva Bush, MD;  Location: [facility];  Service: Cardiovascular;  Laterality: N/A;   TOE SURGERY  02/08/2013    Current Medications: No outpatient medications have been marked as taking for the 06/23/21 encounter (Appointment) with Rise Mu, PA-C.    Allergies:   Pregabalin   Social History   Socioeconomic History   Marital status: Married    Spouse name: Not on file   Number of children: Not on file   Years of education: Not on file   Highest education level: Not on file  Occupational History   Not on file  Tobacco Use   Smoking status: Never   Smokeless tobacco: Never   Tobacco comments:    tobacco use- no   Vaping Use   Vaping Use: Never used  Substance and Sexual Activity   Alcohol use: No   Drug use: No   Sexual activity: Not on file  Other Topics Concern   Not on file  Social History Narrative   Full time. Does not regularly exercise.    Social Determinants of Health   Financial Resource Strain: Not on file  Food Insecurity: Not on file  Transportation Needs: Not on file  Physical Activity: Not on file  Stress: Not on file  Social Connections: Not on file     Family History:  The patient's family history includes COPD in her mother; Diabetes in her father; Heart failure in her father; Hypertension in her father; Lupus in her father.  ROS:   ROS   EKGs/Labs/Other Studies Reviewed:    Studies reviewed were  summarized above. The additional studies were reviewed today:  Zio patch 07/2020: The patient was monitored for 14 days. The predominant rhythm was sinus with an average rate of 74 bpm (range 57-110 bpm in sinus). There were rare PAC's and PVC's. Five atrial runs lasting up to 7 beats occurred with a maximum rate of 119 bpm. A single 5 beat run of nonsustained ventricular tachycardia also occurred with a maximum rate of 156 bpm. No sustained arrhythmia or prolonged pause was observed. There were no patient triggered events.   Predominantly sinus rhythm with rare PAC' and PVC's.  Rare episodes of brief PSVT and NSVT also noted. __________  LHC 07/2020: Conclusions: Moderate, nonobstructive single-vessel coronary artery disease with 40-50% mid LAD stenosis.  Mild luminal irregularities involving ramus intermedius and right coronary artery also noted. Mildly elevated left ventricular filling pressure (LVEDP 15-20 mmHg).   Recommendations: Continue medical therapy and risk factor modification to prevent  progression of coronary artery disease. __________  2D echo 07/2020: 1. Left ventricular ejection fraction, by estimation, is 65 to 70%. The  left ventricle has normal function. The left ventricle has no regional  wall motion abnormalities. There is mild concentric left ventricular  hypertrophy. Left ventricular diastolic  parameters are consistent with Grade I diastolic dysfunction (impaired  relaxation).   2. Right ventricular systolic function is normal. The right ventricular  size is normal.   3. Left atrial size was moderately dilated.   4. The mitral valve is normal in structure. No evidence of mitral valve  regurgitation. No evidence of mitral stenosis.   5. The aortic valve was not well visualized. Aortic valve regurgitation  is not visualized. No aortic stenosis is present.   EKG:  EKG is ordered today.  The EKG ordered today demonstrates ***  Recent Labs: 07/18/2020: ALT  13 07/19/2020: TSH 1.275 07/20/2020: Magnesium 2.1 07/21/2020: Hemoglobin 12.3; Platelets 151 08/27/2020: BUN 25; Creatinine, Ser 1.04; Potassium 4.9; Sodium 141  Recent Lipid Panel    Component Value Date/Time   CHOL 108 07/20/2020 0434   TRIG 88 07/20/2020 0434   TRIG 49 11/16/2009 0000   HDL 28 (L) 07/20/2020 0434   CHOLHDL 3.9 07/20/2020 0434   VLDL 18 07/20/2020 0434   LDLCALC 62 07/20/2020 0434   LDLCALC 58 11/16/2009 0000    PHYSICAL EXAM:    VS:  LMP 09/12/1999   BMI: There is no height or weight on file to calculate BMI.  Physical Exam  Wt Readings from Last 3 Encounters:  03/30/21 212 lb (96.2 kg)  12/07/20 226 lb (102.5 kg)  08/27/20 231 lb 6.4 oz (105 kg)     ASSESSMENT & PLAN:   CAD involving the native coronary arteries with***angina:  Paroxysmal SVT/NSVT:  HTN with orthostatic hypotension: Blood pressure ***  HLD: ***  PAD:  CKD stage III:  Disposition: F/u with Dr. Rockey Situ or an APP in ***.   Medication Adjustments/Labs and Tests Ordered: Current medicines are reviewed at length with the patient today.  Concerns regarding medicines are outlined above. Medication changes, Labs and Tests ordered today are summarized above and listed in the Patient Instructions accessible in Encounters.   Signed, Christell Faith, PA-C 06/18/2021 1:48 PM     [address] [place], [place] [address] [phone]

## 2021-06-23 ENCOUNTER — Ambulatory Visit: Payer: Medicare HMO | Admitting: Physician Assistant

## 2021-06-29 ENCOUNTER — Encounter: Payer: Self-pay | Admitting: Cardiovascular Disease

## 2021-06-29 ENCOUNTER — Ambulatory Visit: Payer: Medicare HMO | Admitting: Cardiovascular Disease

## 2021-06-29 ENCOUNTER — Other Ambulatory Visit: Payer: Self-pay

## 2021-06-29 VITALS — BP 128/68 | HR 71 | Ht 66.0 in | Wt 212.0 lb

## 2021-06-29 DIAGNOSIS — E1159 Type 2 diabetes mellitus with other circulatory complications: Secondary | ICD-10-CM

## 2021-06-29 DIAGNOSIS — I739 Peripheral vascular disease, unspecified: Secondary | ICD-10-CM

## 2021-06-29 DIAGNOSIS — R55 Syncope and collapse: Secondary | ICD-10-CM | POA: Diagnosis not present

## 2021-06-29 DIAGNOSIS — I25118 Atherosclerotic heart disease of native coronary artery with other forms of angina pectoris: Secondary | ICD-10-CM

## 2021-06-29 DIAGNOSIS — I214 Non-ST elevation (NSTEMI) myocardial infarction: Secondary | ICD-10-CM | POA: Diagnosis not present

## 2021-06-29 DIAGNOSIS — I152 Hypertension secondary to endocrine disorders: Secondary | ICD-10-CM

## 2021-06-29 MED ORDER — ATORVASTATIN CALCIUM 40 MG PO TABS: ORAL_TABLET | ORAL | 11 refills | Status: AC

## 2021-06-29 MED ORDER — CARVEDILOL 12.5 MG PO TABS: 12.5000 mg | ORAL_TABLET | Freq: Two times a day (BID) | ORAL | 3 refills | Status: DC

## 2021-06-29 MED ORDER — EMPAGLIFLOZIN 10 MG PO TABS: 10.0000 mg | ORAL_TABLET | Freq: Every day | ORAL | 11 refills | Status: DC

## 2021-06-29 MED ORDER — LISINOPRIL 10 MG PO TABS: 10.0000 mg | ORAL_TABLET | Freq: Every day | ORAL | 3 refills | Status: DC

## 2021-06-29 MED ORDER — FUROSEMIDE 20 MG PO TABS: 20.0000 mg | ORAL_TABLET | Freq: Every day | ORAL | 11 refills | Status: DC | PRN

## 2021-06-29 NOTE — Patient Instructions (Addendum)
Medication Instructions:  Please take lasix as needed for shortness of breath, ankle swelling  REDUCE Lisinopril 10 mg daily  If you need a refill on your cardiac medications before your next appointment, please call your pharmacy.   Lab work: No new labs needed  Testing/Procedures: Lower extremity ultrasound (claudication, PAD)  Follow-Up: At [facility], you and your health needs are our priority.  As part of our continuing mission to provide you with exceptional heart care, we have created designated Provider Care Teams.  These Care Teams include your primary Cardiologist (physician) and Advanced Practice Providers (APPs -  Physician Assistants and Nurse Practitioners) who all work together to provide you with the care you need, when you need it.  You will need a follow up appointment in 6 months  Providers on your designated Care Team:   Murray Hodgkins, NP Christell Faith, PA-C Marrianne Mood, PA-C Cadence Chireno, [place]  COVID-19 Vaccine Information can be found at: [email or web address] For questions related to vaccine distribution or appointments, please email [email or web address] or call [phone].

## 2021-06-29 NOTE — Progress Notes (Signed)
Cardiology Office Note  Date:  06/29/2021   ID:  Jasmine Davidson, DOB 1956/10/03, MRN [id number]  PCP:  Theotis Burrow, MD   Chief Complaint  Patient presents with   Other    Patient c.o stabbing chest pain -- Not lasting long. Meds reviewed verbally with patient.     HPI:  Jasmine Davidson is a 65 year old woman with past medical history of obesity,  syncope though secondary to vasovagal etiology managed with b-blockers,  mild to moderate CAD,  diabetes,  HTN,  Hyperlipidemia pSVT, NSVT, CKD stage III, COVID-19 in 11/2019,  IDDM with diabetic retinopathy and neuropathy, HTN, HLD,  PVD followed by [facility],  Who presents for follow-up of her coronary disease  Last office visit December 2021 Cath 07/2020 Moderate, nonobstructive single-vessel coronary artery disease with 40-50% mid LAD stenosis.  Mild luminal irregularities involving ramus intermedius and right coronary artery also noted. Mildly elevated left ventricular filling pressure (LVEDP 15-20 mmHg).   Had weight loss, >20 pounds, no orthostatic Jun22nd , BP low, 89/60 Felt orthostatic  Drinks water all the time Takes lasix 2x a week  Outside in the summer this year  " Lots of dizziness"  Neuropathy in legs Wear compression hose  Labs reviewed A1C 5.6 to 6 CR 1.06  Carotid u/s 2021 Bilateral carotid atherosclerosis. No hemodynamically significant ICA stenosis. Degree of narrowing less than 50% bilaterally by ultrasound criteria.  EKG personally reviewed by myself on todays visit NSR rate 71 bpm old anterior MI LAd  extensive records reviewed: cardiac cath at [facility] in 2015 revealed 50% mid LAD stenosis with an FFR of 0.88, that was not hemodynamically significant, and LVEF of 60%.   nonobstructive renal and SFA disease with an LVEDP of 11 mmHg.    COVID-19 in 11/2019. Had vaccine since then   admitted to [facility] on 07/18/20 with severe dizziness and presyncope left-sided chest discomfort hypertensive  upon arrival.    Orthostatic vitals were positive upon standing.    Echo demonstrated an EF of 65 to 70%, no regional wall motion abnormalities, grade 1 diastolic dysfunction, normal RV systolic function and ventricular cavity size, moderately dilated left atrium, and no significant valvular disease.   LHC on 07/21/2020 which showed moderate 1-vessel CAD with 40 to 50% mid LAD stenosis, minimal luminal irregularities involving the ramus intermedius and RCA   Mildly elevated LVEDP at 15 to 20 mmHg.  Medical therapy was recommended.  acute on CKD, her HCTZ was held. presyncope was represented by orthostasis versus vasovagal episode.    Outpatient cardiac monitoring demonstrated a predominant rhythm of sinus with an average heart rate of 74 bpm (range 57 to 110 bpm in sinus), rare PACs and PVCs, 5 short runs of PSVT were noted with the longest interval lasting 7 beats with a maximum rate of 119 bpm.  Single 5 beat run of NSVT was noted with a maximum rate of 156 bpm, no sustained arrhythmias or prolonged pauses were noted.  Carotid ultrasound and lower extremity arterial Dopplers reviewed with her in detail  History of retinal vein occlusion. numerous laser treatments.   Lab Results  Component Value Date   CHOL 108 07/20/2020   HDL 28 (L) 07/20/2020   LDLCALC 62 07/20/2020   TRIG 88 07/20/2020    PMH:   has a past medical history of CAD (coronary artery disease), CKD (chronic kidney disease) stage 3, GFR 30-59 ml/min (HCC), Diabetes mellitus, HTN (hypertension), Hyperlipidemia, and Obesity.  PSH:    Past Surgical  History:  Procedure Laterality Date   BLADDER REPAIR W/ CESAREAN SECTION  1981   BREAST BIOPSY Right 12/17/2014   negative stereotactic   CARDIAC CATHETERIZATION     COLONOSCOPY WITH PROPOFOL N/A 03/30/2021   Procedure: COLONOSCOPY WITH PROPOFOL;  Surgeon: Lesly Rubenstein, MD;  Location: [facility] ENDOSCOPY;  Service: Endoscopy;  Laterality: N/A;  DM   cyst taken off finger  2011    ESOPHAGOGASTRODUODENOSCOPY (EGD) WITH PROPOFOL N/A 03/30/2021   Procedure: ESOPHAGOGASTRODUODENOSCOPY (EGD) WITH PROPOFOL;  Surgeon: Lesly Rubenstein, MD;  Location: [facility] ENDOSCOPY;  Service: Endoscopy;  Laterality: N/A;   LEFT HEART CATH AND CORONARY ANGIOGRAPHY N/A 07/21/2020   Procedure: LEFT HEART CATH AND CORONARY ANGIOGRAPHY;  Surgeon: Nelva Bush, MD;  Location: [place] [facility];  Service: Cardiovascular;  Laterality: N/A;   TOE SURGERY  02/08/2013    Current Outpatient Medications  Medication Sig Dispense Refill   aspirin (ASPIRIN 81) 81 MG EC tablet Take 81 mg by mouth daily.     atorvastatin (LIPITOR) 40 MG tablet TAKE ONE TABLET BY MOUTH EVERY DAY 30 tablet 11   carvedilol (COREG) 12.5 MG tablet Take 1 tablet (12.5 mg total) by mouth 2 (two) times daily with a meal. 180 tablet 1   cetirizine (ZYRTEC) 10 MG tablet Take 10 mg by mouth daily as needed.     Cholecalciferol (EQL VITAMIN D3) 25 MCG (1000 UT) capsule Take 1,000 Units by mouth daily.     empagliflozin (JARDIANCE) 10 MG TABS tablet Take 10 mg by mouth daily.     Ferrous Sulfate (IRON) 325 (65 Fe) MG TABS Take by mouth.     fluticasone (FLONASE) 50 MCG/ACT nasal spray SMARTSIG:1-2 Spray(s) Both Nares Daily     furosemide (LASIX) 20 MG tablet Take 20 mg by mouth daily as needed.     gabapentin (NEURONTIN) 300 MG capsule Take 300 mg by mouth 2 (two) times daily.      insulin NPH-regular Human (70-30) 100 UNIT/ML injection Inject 5 Units into the skin 2 (two) times daily with a meal. Inject 12 u under the skin every morning and inject 10 u under the skin every night     liraglutide (VICTOZA) 18 MG/3ML SOPN Inject 0.6 mg into the skin daily.     lisinopril (ZESTRIL) 20 MG tablet Take 1 tablet (20 mg total) by mouth daily. 30 tablet 0   metFORMIN (GLUMETZA) 1000 MG (MOD) 24 hr tablet Take 1,000 mg by mouth 2 (two) times daily with a meal.     vitamin B-12 (CYANOCOBALAMIN) 1000 MCG tablet Take 1,000 mcg by mouth daily.      No current facility-administered medications for this visit.    Allergies:   Pregabalin   Social History:  The patient  reports that she has never smoked. She has never used smokeless tobacco. She reports that she does not drink alcohol and does not use drugs.   Family History:   family history includes COPD in her mother; Diabetes in her father; Heart failure in her father; Hypertension in her father; Lupus in her father.   Review of Systems: Review of Systems  Constitutional: Negative.   HENT: Negative.    Respiratory: Negative.    Cardiovascular: Negative.   Gastrointestinal: Negative.   Musculoskeletal: Negative.   Neurological: Negative.   Psychiatric/Behavioral: Negative.    All other systems reviewed and are negative.  PHYSICAL EXAM: VS:  BP 128/68 (BP Location: Left Arm, Patient Position: Sitting, Cuff Size: Normal)   Pulse 71  Ht '5\' 6"'$  (1.676 m)   Wt 212 lb (96.2 kg)   LMP 09/12/1999   SpO2 98%   BMI 34.22 kg/m  , BMI Body mass index is 34.22 kg/m. Constitutional:  oriented to person, place, and time. No distress.  HENT:  Head: Grossly normal Eyes:  no discharge. No scleral icterus.  Neck: No JVD, no carotid bruits  Cardiovascular: Regular rate and rhythm, no murmurs appreciated Pulmonary/Chest: Clear to auscultation bilaterally, no wheezes or rails Abdominal: Soft.  no distension.  no tenderness.  Musculoskeletal: Normal range of motion Neurological:  normal muscle tone. Coordination normal. No atrophy Skin: Skin warm and dry Psychiatric: normal affect, pleasant  Recent Labs: 07/18/2020: ALT 13 07/19/2020: TSH 1.275 07/20/2020: Magnesium 2.1 07/21/2020: Hemoglobin 12.3; Platelets 151 08/27/2020: BUN 25; Creatinine, Ser 1.04; Potassium 4.9; Sodium 141    Lipid Panel Lab Results  Component Value Date   CHOL 108 07/20/2020   HDL 28 (L) 07/20/2020   LDLCALC 62 07/20/2020   TRIG 88 07/20/2020      Wt Readings from Last 3 Encounters:  06/29/21 212 lb  (96.2 kg)  03/30/21 212 lb (96.2 kg)  12/07/20 226 lb (102.5 kg)      ASSESSMENT AND PLAN:  Problem List Items Addressed This Visit       Cardiology Problems   Hypertension associated with diabetes ([place])   Near syncope   Non-ST elevation (NSTEMI) myocardial infarction ([place]) - Primary   Relevant Orders   EKG 12-Lead   CAD, NATIVE VESSEL   Relevant Orders   EKG 12-Lead  CAD  Currently with no symptoms of angina. No further workup at this time. Continue current medication regimen.  Leg edema Mild, stable,  Compression hose Better with weight loss Stop lasix, having orthostasis  Orthostasis Lasix as needed not regularly scheduled Lisinopril down to 10 mg daily  Hyperlipidemia Cholesterol is at goal on the current lipid regimen. No changes to the medications were made.  PAD: Nonobstructive carotid disease Cholesterol at goal,  Discussed LE arterial study,  Will order repeat LE arterial study    Total encounter time more than 25 minutes  Greater than 50% was spent in counseling and coordination of care with the patient    Signed, Esmond Plants, M.D., Ph.D. [place], [place]

## 2021-07-20 ENCOUNTER — Other Ambulatory Visit: Payer: Self-pay | Admitting: Cardiovascular Disease

## 2021-08-12 ENCOUNTER — Other Ambulatory Visit: Payer: Self-pay | Admitting: Cardiovascular Disease

## 2021-08-12 DIAGNOSIS — I739 Peripheral vascular disease, unspecified: Secondary | ICD-10-CM

## 2021-08-18 ENCOUNTER — Other Ambulatory Visit: Payer: Self-pay

## 2021-08-18 ENCOUNTER — Ambulatory Visit (INDEPENDENT_AMBULATORY_CARE_PROVIDER_SITE_OTHER): Payer: Medicare HMO

## 2021-08-18 DIAGNOSIS — I739 Peripheral vascular disease, unspecified: Secondary | ICD-10-CM | POA: Diagnosis not present

## 2021-08-19 ENCOUNTER — Encounter: Payer: Self-pay | Admitting: Cardiovascular Disease

## 2021-08-19 ENCOUNTER — Ambulatory Visit: Payer: Medicare HMO | Admitting: Cardiovascular Disease

## 2021-08-19 VITALS — BP 120/60 | HR 74 | Ht 66.0 in | Wt 221.4 lb

## 2021-08-19 DIAGNOSIS — I10 Essential (primary) hypertension: Secondary | ICD-10-CM

## 2021-08-19 DIAGNOSIS — I2510 Atherosclerotic heart disease of native coronary artery without angina pectoris: Secondary | ICD-10-CM

## 2021-08-19 DIAGNOSIS — I739 Peripheral vascular disease, unspecified: Secondary | ICD-10-CM

## 2021-08-19 DIAGNOSIS — E785 Hyperlipidemia, unspecified: Secondary | ICD-10-CM

## 2021-08-19 NOTE — Progress Notes (Signed)
Cardiology Office Note   Date:  08/19/2021   ID:  BERDA CORONEL, DOB 1956-01-20, MRN [id number]  PCP:  Theotis Burrow, MD  Cardiologist: Dr. Rockey Situ  Chief Complaint  Patient presents with   Other    Discuss positive arterial. Meds reviewed verbally with pt.      History of Present Illness: Jasmine Davidson is a 65 y.o. female who was referred by Dr. Rockey Situ for evaluation and management of peripheral arterial disease. She has history of mild to moderate nonobstructive coronary artery disease, diabetes mellitus, essential hypertension, hyperlipidemia, stage III chronic kidney disease and peripheral arterial disease.  She has followed in the past at [facility] vascular surgery clinic for chronic venous status status post right GSV stripping and left GSV venous ablation.  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  She currently has no lower extremity ulceration or rest pain.  She describes bilateral leg weakness with walking worse on the left than the right with some Discomfort.  However, she is able to walk for a long distance without having to stop.  She underwent noninvasive vascular studies yesterday which showed an ABI of 0.97 on the right and 0.52 on the left.  Duplex showed no significant disease on the right side.  On the left side, there was suspected significant disease versus short occlusion in the distal SFA/proximal popliteal artery.  Past Medical History:  Diagnosis Date   CAD (coronary artery disease)    CKD (chronic kidney disease) stage 3, GFR 30-59 ml/min (HCC)    Diabetes mellitus    HTN (hypertension)    Hyperlipidemia    Obesity     Past Surgical History:  Procedure Laterality Date   BLADDER REPAIR W/ CESAREAN SECTION  1981   BREAST BIOPSY Right 12/17/2014   negative stereotactic   CARDIAC CATHETERIZATION     COLONOSCOPY WITH PROPOFOL N/A 03/30/2021   Procedure: COLONOSCOPY WITH  PROPOFOL;  Surgeon: Lesly Rubenstein, MD;  Location: [facility] ENDOSCOPY;  Service: Endoscopy;  Laterality: N/A;  DM   cyst taken off finger  2011   ESOPHAGOGASTRODUODENOSCOPY (EGD) WITH PROPOFOL N/A 03/30/2021   Procedure: ESOPHAGOGASTRODUODENOSCOPY (EGD) WITH PROPOFOL;  Surgeon: Lesly Rubenstein, MD;  Location: [facility] END[facility];  Service: Endoscopy;  Laterality: N/A;   LEFT HEART CATH AND CORONARY ANGIOGRAPHY N/A 07/21/2020   Procedure: LEFT HEART CATH AND CORONARY ANGIOGRAPHY;  Surgeon: Nelva Bush, MD;  Location: [place] [facility];  Service: Cardiovascular;  Laterality: N/A;   TOE SURGERY  02/08/2013     Current Outpatient Medications  Medication Sig Dispense Refill   aspirin (ASPIRIN 81) 81 MG EC tablet Take 81 mg by mouth daily.     atorvastatin (LIPITOR) 40 MG tablet TAKE ONE TABLET BY MOUTH EVERY DAY 30 tablet 11   carvedilol (COREG) 12.5 MG tablet TAKE 1 TABLET BY MOUTH 2 TIMES A DAY WITH A MEAL 180 tablet 2   cetirizine (ZYRTEC) 10 MG tablet Take 10 mg by mouth daily as needed.     Cholecalciferol (EQL VITAMIN D3) 25 MCG (1000 UT) capsule Take 1,000 Units by mouth daily.     empagliflozin (JARDIANCE) 10 MG TABS tablet Take 1 tablet (10 mg total) by mouth daily. 30 tablet 11   Ferrous Sulfate (IRON) 325 (65 Fe) MG TABS Take by mouth.     fluticasone (FLONASE) 50 MCG/ACT nasal spray SMARTSIG:1-2 Spray(s) Both Nares Daily     furosemide (  LASIX) 20 MG tablet Take 1 tablet (20 mg total) by mouth daily as needed. 30 tablet 11   gabapentin (NEURONTIN) 300 MG capsule Take 300 mg by mouth 2 (two) times daily.      insulin NPH-regular Human (70-30) 100 UNIT/ML injection Inject 5 Units into the skin 2 (two) times daily with a meal. Inject 12 u under the skin every morning and inject 10 u under the skin every night     liraglutide (VICTOZA) 18 MG/3ML SOPN Inject 0.6 mg into the skin daily.     lisinopril (ZESTRIL) 10 MG tablet Take 1 tablet (10 mg total) by mouth daily. 90 tablet 3    metFORMIN (GLUMETZA) 1000 MG (MOD) 24 hr tablet Take 1,000 mg by mouth 2 (two) times daily with a meal.     vitamin B-12 (CYANOCOBALAMIN) 1000 MCG tablet Take 1,000 mcg by mouth daily.     No current facility-administered medications for this visit.    Allergies:   Pregabalin    Social History:  The patient  reports that she has never smoked. She has never used smokeless tobacco. She reports that she does not drink alcohol and does not use drugs.   Family History:  The patient's family history includes COPD in her mother; Diabetes in her father; Heart failure in her father; Hypertension in her father; Lupus in her father.    ROS:  Please see the history of present illness.   Otherwise, review of systems are positive for none.   All other systems are reviewed and negative.    PHYSICAL EXAM: VS:  BP 120/60 (BP Location: Left Arm, Patient Position: Sitting, Cuff Size: Large)   Pulse 74   Ht '5\' 6"'$  (1.676 m)   Wt 221 lb 6 oz (100.4 kg)   LMP 09/12/1999   SpO2 98%   BMI 35.73 kg/m  , BMI Body mass index is 35.73 kg/m. GEN: Well nourished, well developed, in no acute distress  HEENT: normal  Neck: no JVD, carotid bruits, or masses Cardiac: RRR; no murmurs, rubs, or gallops,no edema  Respiratory:  clear to auscultation bilaterally, normal work of breathing GI: soft, nontender, nondistended, + BS MS: no deformity or atrophy  Skin: warm and dry, no rash Neuro:  Strength and sensation are intact Psych: euthymic mood, full affect Vascular: Femoral pulses normal bilaterally.  Distal pulses are palpable on the right side but not the left side.   EKG:  EKG is ordered today. The ekg ordered today demonstrates normal sinus rhythm with left axis deviation with poor R wave progression in the anterior leads.   Recent Labs: 08/27/2020: BUN 25; Creatinine, Ser 1.04; Potassium 4.9; Sodium 141    Lipid Panel    Component Value Date/Time   CHOL 108 07/20/2020 0434   TRIG 88 07/20/2020 0434    TRIG 49 11/16/2009 0000   HDL 28 (L) 07/20/2020 0434   CHOLHDL 3.9 07/20/2020 0434   VLDL 18 07/20/2020 0434   LDLCALC 62 07/20/2020 0434   LDLCALC 58 11/16/2009 0000      Wt Readings from Last 3 Encounters:  08/19/21 221 lb 6 oz (100.4 kg)  06/29/21 212 lb (96.2 kg)  03/30/21 212 lb (96.2 kg)       No flowsheet data found.    ASSESSMENT AND PLAN:  1.  Peripheral arterial disease: The patient has mild left leg claudication that does not seem to be lifestyle limiting at this point.  She has no evidence of critical limb ischemia.  I discussed with him the natural history and management of claudication.  No revascularization is advised at this stage but we will monitor her symptoms closely.  I discussed with her the importance of proper foot hygiene and avoiding injuries.  2.  Coronary artery disease involving native coronary arteries without angina: She reports no anginal symptoms.  Continue medical therapy.  3.  Hyperlipidemia: Given her cardiovascular history, I recommend a target LDL of less than 70.  I reviewed most recent lipid profile done in August of last year which showed an LDL of 62.  Thus, recommend continuing atorvastatin 40 mg daily.  4.  Essential hypertension: Blood pressure is well controlled on current medications.    Disposition:   FU with me in 1 year  Signed,  Kathlyn Sacramento, MD  08/19/2021 11:24 AM    [place]

## 2021-08-19 NOTE — Patient Instructions (Signed)
Medication Instructions:  Your physician recommends that you continue on your current medications as directed. Please refer to the Current Medication list given to you today.  *If you need a refill on your cardiac medications before your next appointment, please call your pharmacy*   Lab Work: None ordered  If you have labs (blood work) drawn today and your tests are completely normal, you will receive your results only by: [place] (if you have MyChart) OR A paper copy in the mail If you have any lab test that is abnormal or we need to change your treatment, we will call you to review the results.   Testing/Procedures: None ordered    Follow-Up: At [facility], you and your health needs are our priority.  As part of our continuing mission to provide you with exceptional heart care, we have created designated Provider Care Teams.  These Care Teams include your primary Cardiologist (physician) and Advanced Practice Providers (APPs -  Physician Assistants and Nurse Practitioners) who all work together to provide you with the care you need, when you need it.  We recommend signing up for the patient portal called "MyChart".  Sign up information is provided on this After Visit Summary.  MyChart is used to connect with patients for Virtual Visits (Telemedicine).  Patients are able to view lab/test results, encounter notes, upcoming appointments, etc.  Non-urgent messages can be sent to your provider as well.   To learn more about what you can do with MyChart, go to [email or web address].    Your next appointment:   Your physician wants you to follow-up in: 1 year You will receive a reminder letter in the mail two months in advance. If you don't receive a letter, please call our office to schedule the follow-up appointment.   The format for your next appointment:   In Person  Provider:   Kathlyn Sacramento, MD   Other Instructions N/A

## 2021-08-27 ENCOUNTER — Telehealth: Payer: Self-pay

## 2021-08-27 NOTE — Telephone Encounter (Signed)
Able to reach pt regarding her recent Vas LE doppler Dr. Rockey Situ had a chance to review her results and advised   "Lower extremity arterial Doppler  Moderate blockages seen on the left lower extremity  It has been 3 years since she has seen vascular surgery at [facility]  Which she like to establish with someone through our office?  If so would place consultation with Dr. Fletcher Anon "  [name] very thankful for the phone call of her results, she reports she has already seen Dr. Fletcher Anon regarding for her vascular disease after her ECHO on 08/19/2021. He was able to review her exam with her. F/u in 1 year with Dr. April Holding and f/u 6 months with Dr. Rockey Situ. Otherwise all questions and concerns were address with nothing further at this time.

## 2021-10-22 ENCOUNTER — Other Ambulatory Visit: Payer: Self-pay | Admitting: Cardiovascular Disease

## 2021-10-22 DIAGNOSIS — I739 Peripheral vascular disease, unspecified: Secondary | ICD-10-CM

## 2021-11-22 ENCOUNTER — Other Ambulatory Visit: Payer: Self-pay | Admitting: Family Medicine

## 2021-11-22 DIAGNOSIS — Z1231 Encounter for screening mammogram for malignant neoplasm of breast: Secondary | ICD-10-CM

## 2021-12-08 ENCOUNTER — Ambulatory Visit
Admission: RE | Admit: 2021-12-08 | Discharge: 2021-12-08 | Disposition: A | Discharge status: Home / Self Care | Payer: Medicare HMO | Source: Ambulatory Visit | Attending: Family Medicine | Admitting: Family Medicine

## 2021-12-08 ENCOUNTER — Other Ambulatory Visit: Payer: Self-pay

## 2021-12-08 DIAGNOSIS — Z1231 Encounter for screening mammogram for malignant neoplasm of breast: Secondary | ICD-10-CM | POA: Diagnosis present

## 2021-12-26 NOTE — Progress Notes (Signed)
Cardiology Office Note  Date:  12/27/2021   ID:  Jasmine Davidson, DOB 1956/01/14, MRN [id number]  PCP:  Theotis Burrow, MD   Chief Complaint  Patient presents with   6 month follow up     Patient c/o twinges in chest at times. Medications reviewed by the patient verbally.     HPI:  Jasmine Davidson is a 66 year old woman with past medical history of obesity,  syncope though secondary to vasovagal etiology managed with b-blockers,  Cath 2021: Moderate, nonobstructive single-vessel coronary artery disease with 40-50% mid LAD stenosis. diabetes,  HTN,  Hyperlipidemia pSVT, NSVT, CKD stage III, COVID-19 in 11/2019,  IDDM with diabetic retinopathy and neuropathy,  HTN, HLD,  PVD Who presents for follow-up of her coronary disease  Last office visit July 2022 On that visit had weight loss, low blood pressure  Seen by Dr. Fletcher Anon for PAD September 2022 Mild left leg claudication, Cramps at night Left ABI 0.52  Recommended to follow-up in 1 year  Periodic low pressure at home Takes lasix 2x a week  Echo 2021: normal EF 60%  Labs reviewed A1C 5.6  CR 1.06 No recent lipid panel available  EKG personally reviewed by myself on todays visit NSR rate 71 bpm LAD, no ST or T wave changes  PMH rviewed Neuropathy in legs Wear compression hose  Prior procedures reviewed Cath 07/2020 Moderate, nonobstructive single-vessel coronary artery disease with 40-50% mid LAD stenosis.  Mild luminal irregularities involving ramus intermedius and right coronary artery also noted. Mildly elevated left ventricular filling pressure (LVEDP 15-20 mmHg).    Carotid u/s 2021 Bilateral carotid atherosclerosis. No hemodynamically significant ICA stenosis. Degree of narrowing less than 50% bilaterally by ultrasound criteria.  extensive records reviewed: cardiac cath at [facility] in 2015 revealed 50% mid LAD stenosis with an FFR of 0.88, that was not hemodynamically significant, and LVEF of 60%.    nonobstructive renal and SFA disease with an LVEDP of 11 mmHg.    COVID-19 in 11/2019. Had vaccine since then   admitted to [facility] on 07/18/20 with severe dizziness and presyncope left-sided chest discomfort hypertensive upon arrival.    Orthostatic vitals were positive upon standing.    Echo demonstrated an EF of 65 to 70%, no regional wall motion abnormalities, grade 1 diastolic dysfunction, normal RV systolic function and ventricular cavity size, moderately dilated left atrium, and no significant valvular disease.   LHC on 07/21/2020 which showed moderate 1-vessel CAD with 40 to 50% mid LAD stenosis, minimal luminal irregularities involving the ramus intermedius and RCA   Mildly elevated LVEDP at 15 to 20 mmHg.  Medical therapy was recommended.  acute on CKD, her HCTZ was held. presyncope was represented by orthostasis versus vasovagal episode.    Outpatient cardiac monitoring demonstrated a predominant rhythm of sinus with an average heart rate of 74 bpm (range 57 to 110 bpm in sinus), rare PACs and PVCs, 5 short runs of PSVT were noted with the longest interval lasting 7 beats with a maximum rate of 119 bpm.  Single 5 beat run of NSVT was noted with a maximum rate of 156 bpm, no sustained arrhythmias or prolonged pauses were noted.  Carotid ultrasound and lower extremity arterial Dopplers reviewed with her in detail  History of retinal vein occlusion. numerous laser treatments.   Lab Results  Component Value Date   CHOL 108 07/20/2020   HDL 28 (L) 07/20/2020   LDLCALC 62 07/20/2020   TRIG 88 07/20/2020    PMH:  has a past medical history of CAD (coronary artery disease), CKD (chronic kidney disease) stage 3, GFR 30-59 ml/min (HCC), Diabetes mellitus, HTN (hypertension), Hyperlipidemia, and Obesity.  PSH:    Past Surgical History:  Procedure Laterality Date   BLADDER REPAIR W/ CESAREAN SECTION  1981   BREAST BIOPSY Right 12/17/2014   negative stereotactic   CARDIAC  CATHETERIZATION     COLONOSCOPY WITH PROPOFOL N/A 03/30/2021   Procedure: COLONOSCOPY WITH PROPOFOL;  Surgeon: Lesly Rubenstein, MD;  Location: [facility] ENDOSCOPY;  Service: Endoscopy;  Laterality: N/A;  DM   cyst taken off finger  2011   ESOPHAGOGASTRODUODENOSCOPY (EGD) WITH PROPOFOL N/A 03/30/2021   Procedure: ESOPHAGOGASTRODUODENOSCOPY (EGD) WITH PROPOFOL;  Surgeon: Lesly Rubenstein, MD;  Location: [facility] ENDOSCOPY;  Service: Endoscopy;  Laterality: N/A;   LEFT HEART CATH AND CORONARY ANGIOGRAPHY N/A 07/21/2020   Procedure: LEFT HEART CATH AND CORONARY ANGIOGRAPHY;  Surgeon: Nelva Bush, MD;  Location: [facility];  Service: Cardiovascular;  Laterality: N/A;   TOE SURGERY  02/08/2013    Current Outpatient Medications  Medication Sig Dispense Refill   aspirin (ASPIRIN 81) 81 MG EC tablet Take 81 mg by mouth daily.     atorvastatin (LIPITOR) 40 MG tablet TAKE ONE TABLET BY MOUTH EVERY DAY 30 tablet 11   carvedilol (COREG) 12.5 MG tablet TAKE 1 TABLET BY MOUTH 2 TIMES A DAY WITH A MEAL 180 tablet 2   cetirizine (ZYRTEC) 10 MG tablet Take 10 mg by mouth daily as needed.     Cholecalciferol (EQL VITAMIN D3) 25 MCG (1000 UT) capsule Take 1,000 Units by mouth daily.     empagliflozin (JARDIANCE) 10 MG TABS tablet Take 1 tablet (10 mg total) by mouth daily. 30 tablet 11   Ferrous Sulfate (IRON) 325 (65 Fe) MG TABS Take by mouth.     fluticasone (FLONASE) 50 MCG/ACT nasal spray SMARTSIG:1-2 Spray(s) Both Nares Daily     furosemide (LASIX) 20 MG tablet Take 1 tablet (20 mg total) by mouth daily as needed. 30 tablet 11   gabapentin (NEURONTIN) 300 MG capsule Take 300 mg by mouth 2 (two) times daily.      insulin NPH-regular Human (70-30) 100 UNIT/ML injection Inject 5 Units into the skin 2 (two) times daily with a meal. Inject 12 u under the skin every morning and inject 10 u under the skin every night     liraglutide (VICTOZA) 18 MG/3ML SOPN Inject 0.6 mg into the skin daily.      metFORMIN (GLUMETZA) 1000 MG (MOD) 24 hr tablet Take 1,000 mg by mouth 2 (two) times daily with a meal.     vitamin B-12 (CYANOCOBALAMIN) 1000 MCG tablet Take 1,000 mcg by mouth daily.     lisinopril (ZESTRIL) 10 MG tablet Take 1 tablet (10 mg total) by mouth daily. 90 tablet 3   No current facility-administered medications for this visit.    Allergies:   Pregabalin   Social History:  The patient  reports that she has never smoked. She has never used smokeless tobacco. She reports that she does not drink alcohol and does not use drugs.   Family History:   family history includes COPD in her mother; Diabetes in her father; Heart failure in her father; Hypertension in her father; Lupus in her father.   Review of Systems: Review of Systems  Constitutional: Negative.   HENT: Negative.    Respiratory: Negative.    Cardiovascular: Negative.   Gastrointestinal: Negative.   Musculoskeletal: Negative.  Neurological: Negative.   Psychiatric/Behavioral: Negative.    All other systems reviewed and are negative.  PHYSICAL EXAM: VS:  BP 130/62 (BP Location: Left Arm, Patient Position: Sitting, Cuff Size: Large)    Pulse 71    Ht 5\' 6"  (1.676 m)    Wt 220 lb (99.8 kg)    LMP 09/12/1999    SpO2 98%    BMI 35.51 kg/m  , BMI Body mass index is 35.51 kg/m. Constitutional:  oriented to person, place, and time. No distress.  HENT:  Head: Grossly normal Eyes:  no discharge. No scleral icterus.  Neck: No JVD, no carotid bruits  Cardiovascular: Regular rate and rhythm, no murmurs appreciated Pulmonary/Chest: Clear to auscultation bilaterally, no wheezes or rails Abdominal: Soft.  no distension.  no tenderness.  Musculoskeletal: Normal range of motion Neurological:  normal muscle tone. Coordination normal. No atrophy Skin: Skin warm and dry Psychiatric: normal affect, pleasant   Recent Labs: No results found for requested labs within last 8760 hours.    Lipid Panel Lab Results  Component  Value Date   CHOL 108 07/20/2020   HDL 28 (L) 07/20/2020   LDLCALC 62 07/20/2020   TRIG 88 07/20/2020      Wt Readings from Last 3 Encounters:  12/27/21 220 lb (99.8 kg)  08/19/21 221 lb 6 oz (100.4 kg)  06/29/21 212 lb (96.2 kg)     ASSESSMENT AND PLAN:  Problem List Items Addressed This Visit     Insulin dependent type 2 diabetes mellitus (HCC)   CKD (chronic kidney disease) stage 3, GFR 30-59 ml/min (HCC)   Other Visit Diagnoses     PAD (peripheral artery disease) ([place])    -  Primary   Relevant Orders   EKG 12-Lead   Coronary artery disease involving native coronary artery of native heart without angina pectoris       Relevant Orders   EKG 12-Lead   Hyperlipidemia, unspecified hyperlipidemia type       Essential hypertension         CAD  Currently with no symptoms of angina. No further workup at this time. Continue current medication regimen.  Leg edema Minimal, stable  Orthostasis Suggested she take Lasix as needed not regularly scheduled,  discussed again today (still taking it 2x a week) Blood pressure running low at times Lisinopril 10 mg daily  Hyperlipidemia Cholesterol is at goal on the current lipid regimen. No changes to the medications were made.  PAD: Nonobstructive carotid disease in 2021, <50% Cholesterol at goal,  Discussed LE arterial study  and carotid u/s     Total encounter time more than 25 minutes  Greater than 50% was spent in counseling and coordination of care with the patient    Signed, Esmond Plants, M.D., Ph.D. [place], [place]

## 2021-12-27 ENCOUNTER — Other Ambulatory Visit: Payer: Self-pay

## 2021-12-27 ENCOUNTER — Encounter: Payer: Self-pay | Admitting: Cardiovascular Disease

## 2021-12-27 ENCOUNTER — Ambulatory Visit (INDEPENDENT_AMBULATORY_CARE_PROVIDER_SITE_OTHER): Payer: Medicare HMO | Admitting: Cardiovascular Disease

## 2021-12-27 VITALS — BP 130/62 | HR 71 | Ht 66.0 in | Wt 220.0 lb

## 2021-12-27 DIAGNOSIS — N1831 Chronic kidney disease, stage 3a: Secondary | ICD-10-CM

## 2021-12-27 DIAGNOSIS — I2510 Atherosclerotic heart disease of native coronary artery without angina pectoris: Secondary | ICD-10-CM

## 2021-12-27 DIAGNOSIS — I10 Essential (primary) hypertension: Secondary | ICD-10-CM | POA: Diagnosis not present

## 2021-12-27 DIAGNOSIS — E785 Hyperlipidemia, unspecified: Secondary | ICD-10-CM | POA: Diagnosis not present

## 2021-12-27 DIAGNOSIS — Z794 Long term (current) use of insulin: Secondary | ICD-10-CM

## 2021-12-27 DIAGNOSIS — I739 Peripheral vascular disease, unspecified: Secondary | ICD-10-CM | POA: Diagnosis not present

## 2021-12-27 DIAGNOSIS — E119 Type 2 diabetes mellitus without complications: Secondary | ICD-10-CM

## 2021-12-27 NOTE — Patient Instructions (Signed)
Medication Instructions:  No changes  If you need a refill on your cardiac medications before your next appointment, please call your pharmacy.   Lab work: No new labs needed  Testing/Procedures: No new testing needed  Follow-Up: At [facility], you and your health needs are our priority.  As part of our continuing mission to provide you with exceptional heart care, we have created designated Provider Care Teams.  These Care Teams include your primary Cardiologist (physician) and Advanced Practice Providers (APPs -  Physician Assistants and Nurse Practitioners) who all work together to provide you with the care you need, when you need it.  You will need a follow up appointment in 12 months  Providers on your designated Care Team:   Christopher Berge, NP Ryan Dunn, PA-C Cadence Furth, PA-C  COVID-19 Vaccine Information can be found at: [email or web address] For questions related to vaccine distribution or appointments, please email [email or web address] or call [phone].   

## 2022-04-08 ENCOUNTER — Other Ambulatory Visit: Payer: Self-pay | Admitting: Unknown Physician Specialty

## 2022-04-08 DIAGNOSIS — E041 Nontoxic single thyroid nodule: Secondary | ICD-10-CM

## 2022-04-12 ENCOUNTER — Ambulatory Visit
Admission: RE | Admit: 2022-04-12 | Discharge: 2022-04-12 | Disposition: A | Discharge status: Home / Self Care | Payer: Medicare HMO | Source: Ambulatory Visit | Attending: Unknown Physician Specialty | Admitting: Unknown Physician Specialty

## 2022-04-12 DIAGNOSIS — E041 Nontoxic single thyroid nodule: Secondary | ICD-10-CM

## 2022-04-20 ENCOUNTER — Other Ambulatory Visit: Payer: Self-pay | Admitting: Cardiovascular Disease

## 2022-04-20 NOTE — Telephone Encounter (Signed)
Please schedule 6 month F/U appointment with Dr. Gollan. Thank you! 

## 2022-04-20 NOTE — Telephone Encounter (Signed)
Scheduled with arida due [place] in January .  ?

## 2022-05-04 ENCOUNTER — Other Ambulatory Visit: Payer: Self-pay | Admitting: Unknown Physician Specialty

## 2022-05-04 DIAGNOSIS — E041 Nontoxic single thyroid nodule: Secondary | ICD-10-CM

## 2022-05-07 IMAGING — MG MM DIGITAL SCREENING BILAT W/ TOMO AND CAD
8 series · 8 of 24 positions shown · non-contrast
Comparison: Previous exam(s).

CLINICAL DATA: Screening.

EXAM:
DIGITAL SCREENING BILATERAL MAMMOGRAM WITH TOMOSYNTHESIS AND CAD
TECHNIQUE: Bilateral screening digital craniocaudal and mediolateral oblique
mammograms were obtained. Bilateral screening digital breast
tomosynthesis was performed. The images were evaluated with
computer-aided detection.

[R MLO synth-2D]
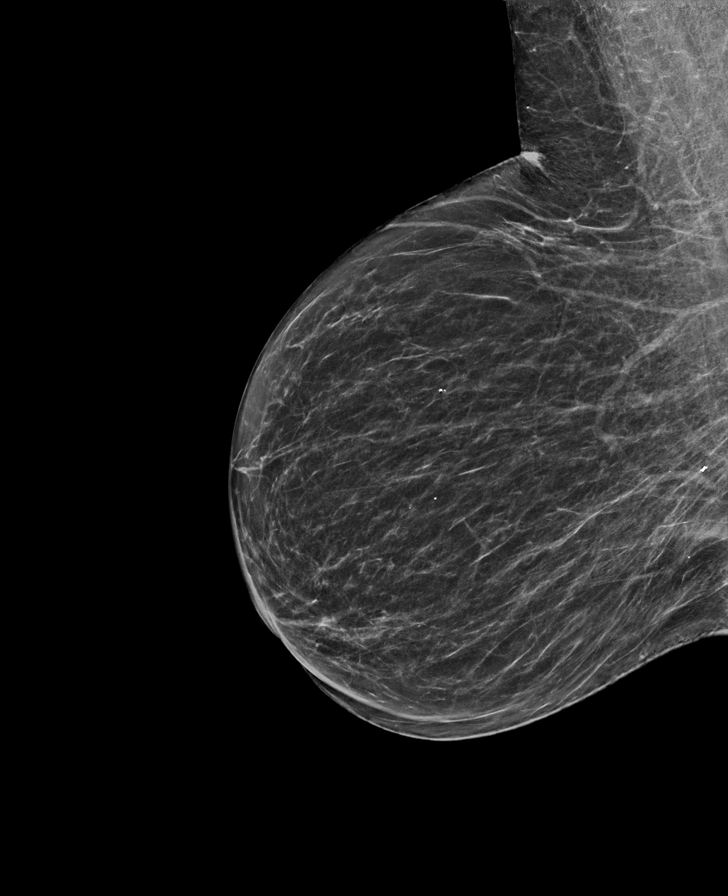

[L MLO synth-2D]
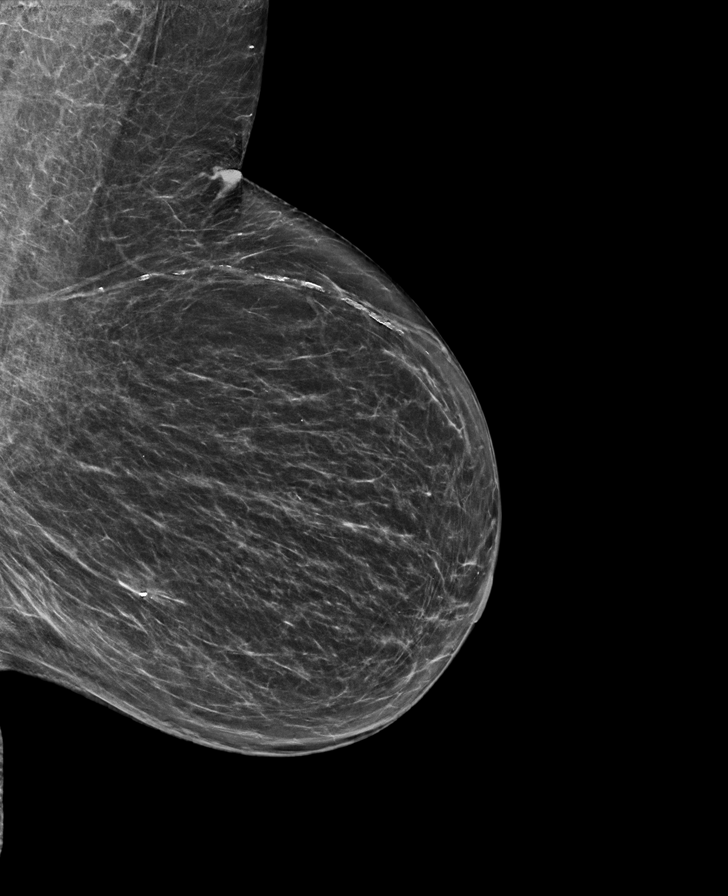

[R CC synth-2D]
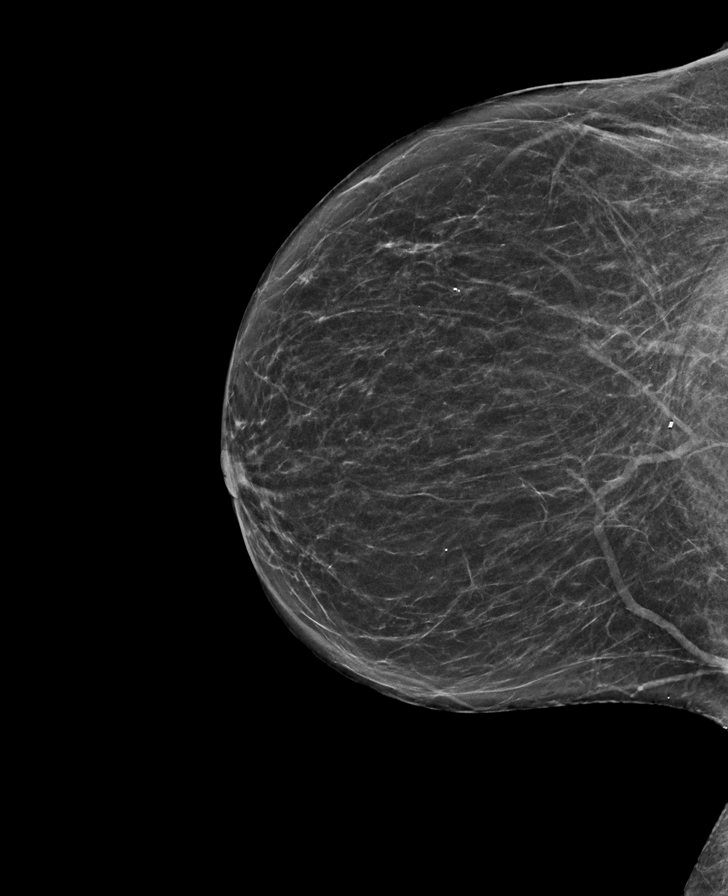

[L CC synth-2D]
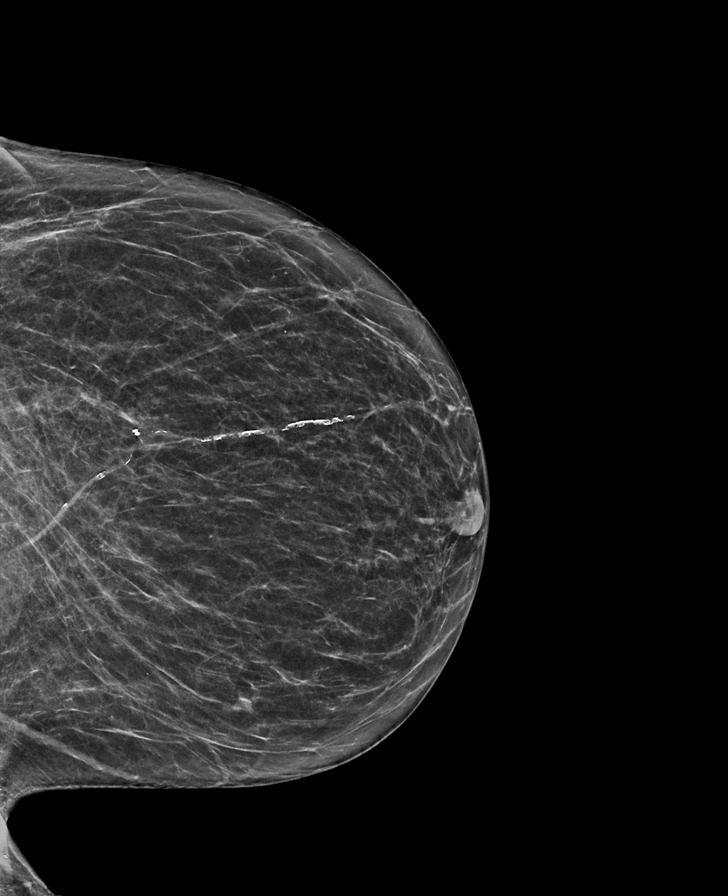

[R MLO tomo · tomo slice 30/59.0]
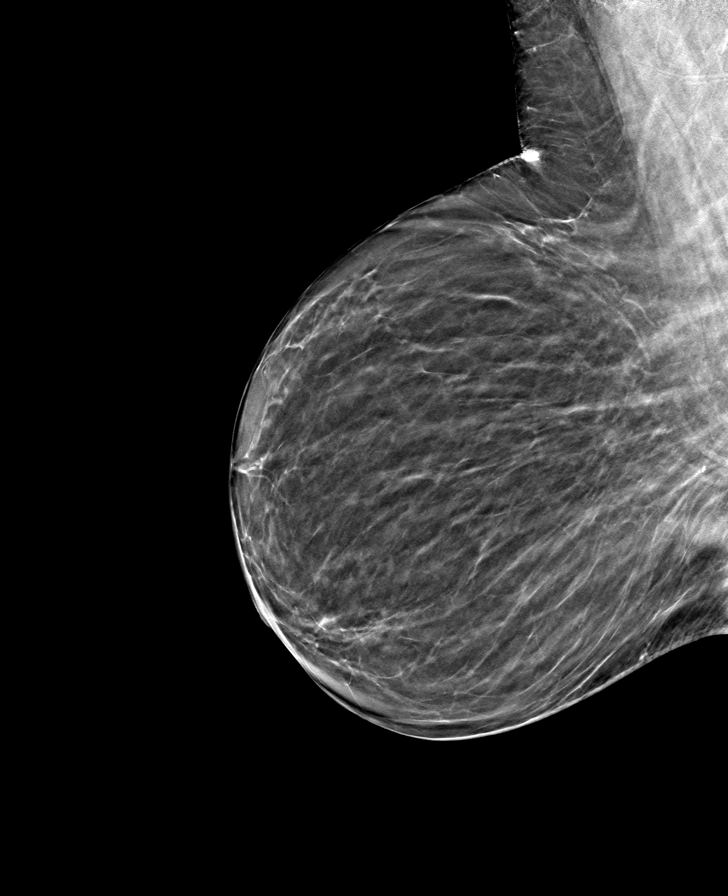

[R CC tomo · tomo slice 28/55.0]
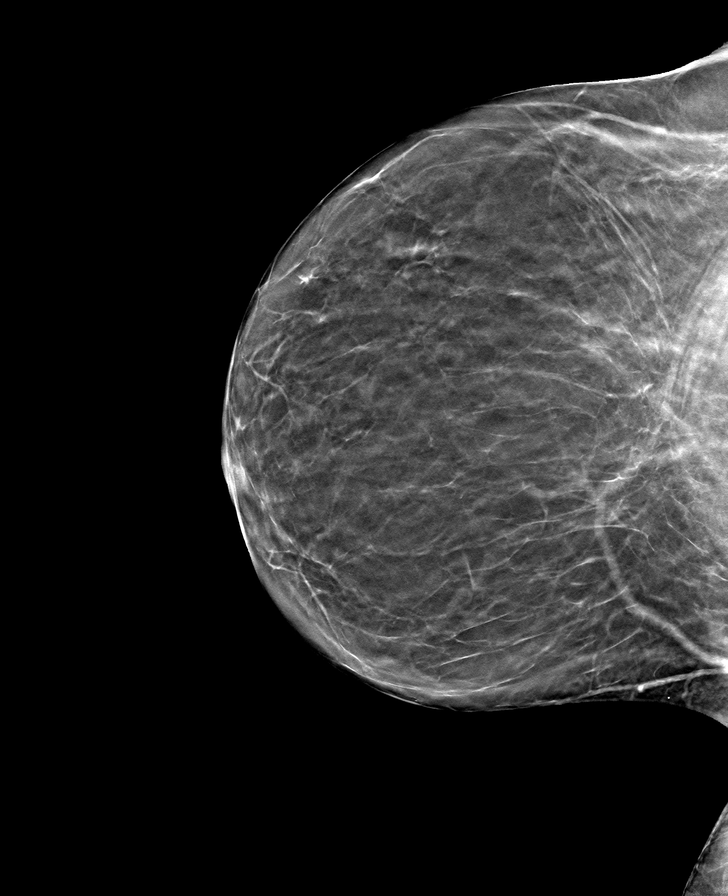

[L CC tomo · tomo slice 31/60.0]
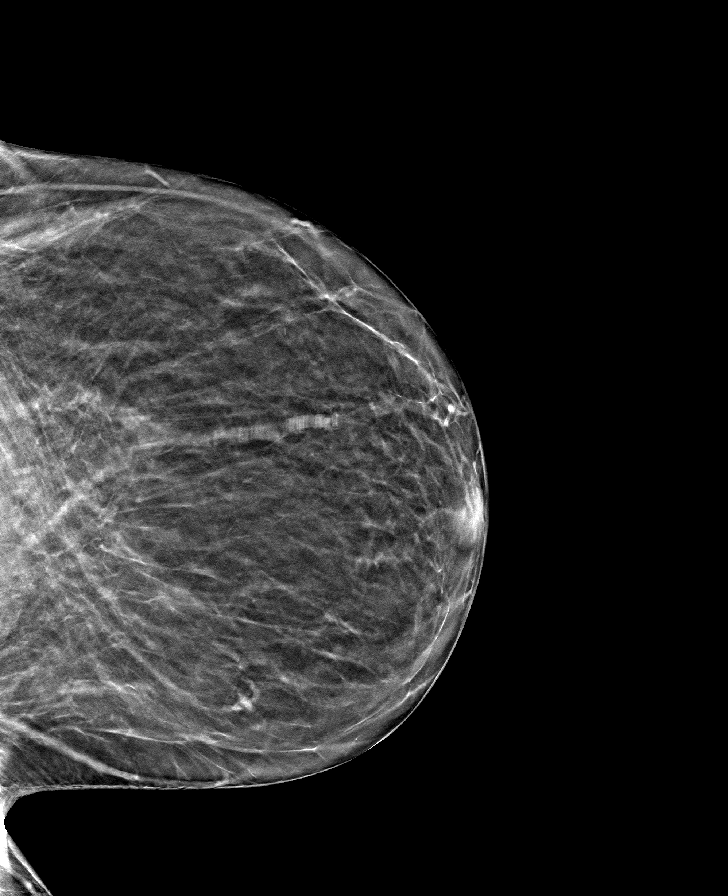

[L MLO tomo · tomo slice 35/70.0]
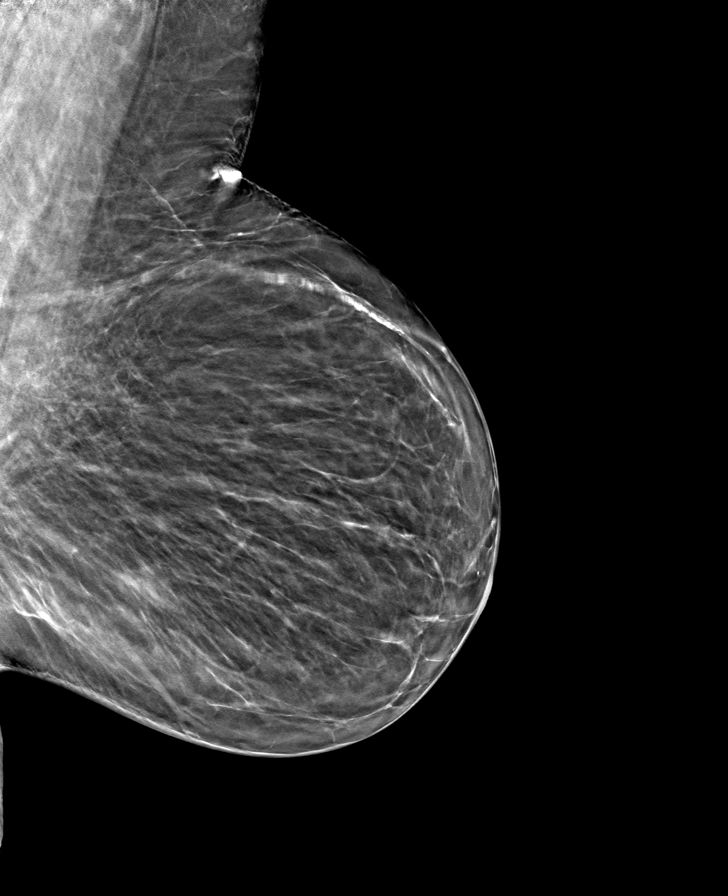

[8 of 24 positions shown; findings below may reference images not displayed]

ACR Breast Density Category b: There are scattered areas of
fibroglandular density.
FINDINGS: There are no findings suspicious for malignancy.
IMPRESSION: No mammographic evidence of malignancy. A result letter of this
screening mammogram will be mailed directly to the patient.

RECOMMENDATION:
Screening mammogram in one year. (Code:51-O-LD2)

BI-RADS CATEGORY  1: Negative.

## 2022-05-24 ENCOUNTER — Encounter: Payer: Self-pay | Admitting: Cardiovascular Disease

## 2022-07-26 ENCOUNTER — Other Ambulatory Visit: Payer: Self-pay | Admitting: Family Medicine

## 2022-07-26 DIAGNOSIS — R42 Dizziness and giddiness: Secondary | ICD-10-CM

## 2022-07-27 ENCOUNTER — Encounter: Payer: Self-pay | Admitting: Emergency Medicine

## 2022-07-27 ENCOUNTER — Other Ambulatory Visit: Payer: Self-pay

## 2022-07-27 DIAGNOSIS — I2510 Atherosclerotic heart disease of native coronary artery without angina pectoris: Secondary | ICD-10-CM | POA: Diagnosis not present

## 2022-07-27 DIAGNOSIS — R55 Syncope and collapse: Secondary | ICD-10-CM | POA: Insufficient documentation

## 2022-07-27 DIAGNOSIS — R42 Dizziness and giddiness: Secondary | ICD-10-CM | POA: Insufficient documentation

## 2022-07-27 DIAGNOSIS — I129 Hypertensive chronic kidney disease with stage 1 through stage 4 chronic kidney disease, or unspecified chronic kidney disease: Secondary | ICD-10-CM | POA: Insufficient documentation

## 2022-07-27 DIAGNOSIS — R519 Headache, unspecified: Secondary | ICD-10-CM | POA: Insufficient documentation

## 2022-07-27 DIAGNOSIS — X30XXXA Exposure to excessive natural heat, initial encounter: Secondary | ICD-10-CM | POA: Insufficient documentation

## 2022-07-27 DIAGNOSIS — N189 Chronic kidney disease, unspecified: Secondary | ICD-10-CM | POA: Insufficient documentation

## 2022-07-27 DIAGNOSIS — E1122 Type 2 diabetes mellitus with diabetic chronic kidney disease: Secondary | ICD-10-CM | POA: Insufficient documentation

## 2022-07-27 LAB — CBC
HCT: 42.9 % (ref 36.0–46.0)
Hemoglobin: 14.1 g/dL (ref 12.0–15.0)
MCH: 31.3 pg (ref 26.0–34.0)
MCHC: 32.9 g/dL (ref 30.0–36.0)
MCV: 95.3 fL (ref 80.0–100.0)
Platelets: 123 10*3/uL — ABNORMAL LOW (ref 150–400)
RBC: 4.5 MIL/uL (ref 3.87–5.11)
RDW: 12.7 % (ref 11.5–15.5)
WBC: 5.4 10*3/uL (ref 4.0–10.5)
nRBC: 0 % (ref 0.0–0.2)

## 2022-07-27 LAB — BASIC METABOLIC PANEL
Anion gap: 9 (ref 5–15)
BUN: 22 mg/dL (ref 8–23)
CO2: 25 mmol/L (ref 22–32)
Calcium: 9.2 mg/dL (ref 8.9–10.3)
Chloride: 104 mmol/L (ref 98–111)
Creatinine, Ser: 1.21 mg/dL — ABNORMAL HIGH (ref 0.44–1.00)
GFR, Estimated: 50 mL/min — ABNORMAL LOW (ref >60)
Glucose, Bld: 141 mg/dL — ABNORMAL HIGH (ref 70–99)
Potassium: 4 mmol/L (ref 3.5–5.1)
Sodium: 138 mmol/L (ref 135–145)

## 2022-07-27 LAB — TROPONIN I (HIGH SENSITIVITY)
Troponin I (High Sensitivity): 9 ng/L (ref <18)
Troponin I (High Sensitivity): 15 ng/L (ref <18)

## 2022-07-27 NOTE — ED Triage Notes (Signed)
Pt presents via POV with c/o headache, dizziness and near syncope episodes that have been occurring over the past 2 weeks. CBG 132. Pt reports drinking her normal water intake. Pt reports today she was in hot gym and almost passed out, but episodes have been occurring outside of being too hot. Pt reports headaches and near syncope episodes started occurring after she had covid-19. Pt endorses nausea with episodes. Pt alert and oriented x4.

## 2022-07-27 NOTE — ED Triage Notes (Signed)
First Nurse Note:  Pt via EMS from home. Pt c/o dizziness, headache and lethargy that started today but has had these episode previously. Pt A&Ox4 and NAD.   158/62 132 CBG 81 HR 100% on RA

## 2022-07-28 ENCOUNTER — Emergency Department: Payer: Medicare HMO

## 2022-07-28 ENCOUNTER — Emergency Department
Admission: EM | Admit: 2022-07-28 | Discharge: 2022-07-28 | Disposition: A | Discharge status: Home / Self Care | Payer: Medicare HMO | Attending: Emergency Medicine | Admitting: Emergency Medicine

## 2022-07-28 DIAGNOSIS — R42 Dizziness and giddiness: Secondary | ICD-10-CM

## 2022-07-28 DIAGNOSIS — G8929 Other chronic pain: Secondary | ICD-10-CM

## 2022-07-28 DIAGNOSIS — T679XXA Effect of heat and light, unspecified, initial encounter: Secondary | ICD-10-CM

## 2022-07-28 MED ORDER — BUTALBITAL-APAP-CAFFEINE 50-325-40 MG PO TABS
2.0000 | ORAL_TABLET | Freq: Once | ORAL | Status: AC
  Administered 2022-07-28: 2 via ORAL
  Filled 2022-07-28: qty 2

## 2022-07-28 MED ORDER — BUTALBITAL-APAP-CAFFEINE 50-325-40 MG PO TABS: 2.0000 | ORAL_TABLET | Freq: Four times a day (QID) | ORAL | 0 refills | Status: AC | PRN

## 2022-07-28 NOTE — Discharge Instructions (Signed)
As we discussed, please try taking Fioricet medications for your headache.  This medication has Tylenol within it, so do not take your extra strength Tylenol alongside it  Please follow-up with your PCP and discuss if a neurology referral would be helpful considering the chronic and poorly controlled nature of your headaches.  Return to the ED with any worsening symptoms

## 2022-07-28 NOTE — ED Notes (Signed)
Pt reports head pressure and dizziness for 2 weeks.  Pt states sx got worse today while at school.  No n/v   no chest pain or sob.   Pt alert  speech clear  family with pt

## 2022-07-28 NOTE — ED Provider Notes (Signed)
[facility] Provider Note    Event Date/Time   First MD Initiated Contact with Patient 07/28/22 0017     (approximate)   History   Headache and Near Syncope   HPI  Jasmine Davidson is a 66 y.o. female who presents to the ED for evaluation of Headache and Near Syncope   I reviewed cardiology clinic visit from January.  Obese patient with history of CAD, HTN, HLD, CKD, DM, PAD.  2021 carotid ultrasound with less than 50% stenosis bilaterally.  Patient presents to the ED for evaluation of chronic intermittent headaches and dizziness.  She reports intermittent global pressure headaches since 2020 when she had COVID-19.  Reports having a typical global pressure headache today.  Reports this evening that she was at a indoor volleyball game in a large gym that did not have air conditioning.  She reports it was quite hot and while seated watching a volleyball game she was feeling more flushed and presyncopal dizzy.  A bystander called 911 to evaluate the patient and she agreed to come to the ED for evaluation because of this dizziness spell.    She reports feeling better now and only has a mild global aching headache while seated here in the ED.   Physical Exam   Triage Vital Signs: ED Triage Vitals  Enc Vitals Group     BP 07/27/22 1932 (!) 174/85     Pulse Rate 07/27/22 1932 80     Resp 07/27/22 1932 18     Temp 07/27/22 2254 97.8 F (36.6 C)     Temp Source 07/27/22 2254 Oral     SpO2 07/27/22 1932 99 %     Weight --      Height --      Head Circumference --      Peak Flow --      Pain Score 07/27/22 1937 4     Pain Loc --      Pain Edu? --      Excl. in [place]? --     Most recent vital signs: Vitals:   07/27/22 2254 07/28/22 0030  BP: (!) 158/75 (!) 169/76  Pulse: 81 71  Resp: 15   Temp: 97.8 F (36.6 C)   SpO2: 98% 99%    General: Awake, no distress. Well-appearing and conversational in full sentences.  CV:  Good peripheral perfusion.   Resp:  Normal effort.  Abd:  No distention. Soft and benign throughout.  MSK:  No deformity noted.  Neuro:  No focal deficits appreciated. Cranial nerves II through XII intact 5/5 strength and sensation in all 4 extremities Other:     ED Results / Procedures / Treatments   Labs (all labs ordered are listed, but only abnormal results are displayed) Labs Reviewed  BASIC METABOLIC PANEL - Abnormal; Notable for the following components:      Result Value   Glucose, Bld 141 (*)    Creatinine, Ser 1.21 (*)    GFR, Estimated 50 (*)    All other components within normal limits  CBC - Abnormal; Notable for the following components:   Platelets 123 (*)    All other components within normal limits  URINALYSIS, ROUTINE W REFLEX MICROSCOPIC  TROPONIN I (HIGH SENSITIVITY)  TROPONIN I (HIGH SENSITIVITY)    EKG Sinus rhythm with a rate of 81 bpm.  Leftward axis and normal intervals.  Incomplete bundle.  No clear signs of acute ischemia.  RADIOLOGY CT head interpreted by me  without evidence of acute intracranial pathology  Official radiology report(s): CT HEAD WO CONTRAST (5MM)  Result Date: 07/28/2022 CLINICAL DATA:  Headache and hypertension mixed CT head 09/05/2008 EXAM: CT HEAD WITHOUT CONTRAST TECHNIQUE: Contiguous axial images were obtained from the base of the skull through the vertex without intravenous contrast. RADIATION DOSE REDUCTION: This exam was performed according to the departmental dose-optimization program which includes automated exposure control, adjustment of the mA and/or kV according to patient size and/or use of iterative reconstruction technique. COMPARISON:  None Available. FINDINGS: Brain: No intracranial hemorrhage, mass effect, or evidence of acute infarct. No hydrocephalus. No extra-axial fluid collection. Vascular: No hyperdense vessel or unexpected calcification. Skull: No fracture or focal lesion. Sinuses/Orbits: No acute finding. Paranasal sinuses and mastoid  air cells are well aerated. Other: None. IMPRESSION: No acute intracranial abnormality. Electronically Signed   By: Placido Sou M.D.   On: 07/28/2022 00:47    PROCEDURES and INTERVENTIONS:  .1-3 Lead EKG Interpretation  Performed by: Vladimir Crofts, MD Authorized by: Vladimir Crofts, MD     Interpretation: normal     ECG rate:  80   ECG rate assessment: normal     Rhythm: sinus rhythm     Ectopy: none     Conduction: normal     Medications  butalbital-acetaminophen-caffeine (FIORICET) 50-325-40 MG per tablet 2 tablet (2 tablets Oral Given 07/28/22 0053)     IMPRESSION / MDM / ASSESSMENT AND PLAN / ED COURSE  I reviewed the triage vital signs and the nursing notes.  Differential diagnosis includes, but is not limited to, heat exposure, vasovagal episode, stroke, TIA, ICH, cardiac dysrhythmia  {Patient presents with symptoms of an acute illness or injury that is potentially life-threatening.  66 year old woman presents to the ED with chronic intermittent headaches and dizziness, without evidence of significant acute pathology and suitable for outpatient management.  Look systemically well without signs of neurologic or vascular deficits.  No signs of trauma.  Work-up is benign with CKD around baseline, normal CBC and 2 negative troponins.  CT head without evidence of ICH or intracranial pathology.  Doubt stroke or TIA.  Resolution of symptoms after Fioricet and after shared decision making we plan on outpatient management.  I considered observation admission for this patient, but ultimately she is well connected as an outpatient I think this is reasonable.  Discussed return precautions.  Clinical Course as of 07/28/22 0141  Thu Jul 28, 2022  0138 Reassessed.  Patient ports feeling better after the Fioricet.  We discussed reassuring CT scan, reassuring work-up overall as well as return precautions.  Discussed PCP follow-up and possible referral to neurology.  Answered questions. [DS]     Clinical Course User Index [DS] Vladimir Crofts, MD     FINAL CLINICAL IMPRESSION(S) / ED DIAGNOSES   Final diagnoses:  Chronic nonintractable headache, unspecified headache type  Dizziness  Heat exposure, initial encounter     Rx / DC Orders   ED Discharge Orders          Ordered    butalbital-acetaminophen-caffeine (FIORICET) 50-325-40 MG tablet  Every 6 hours PRN        07/28/22 0140             Note:  This document was prepared using Dragon voice recognition software and may include unintentional dictation errors.   Vladimir Crofts, MD 07/28/22 [phone]

## 2022-08-19 ENCOUNTER — Ambulatory Visit: Payer: Medicare HMO | Attending: Cardiovascular Disease

## 2022-08-19 DIAGNOSIS — I739 Peripheral vascular disease, unspecified: Secondary | ICD-10-CM | POA: Diagnosis not present

## 2022-08-22 NOTE — Progress Notes (Signed)
Requested PCP notes.  

## 2022-08-25 ENCOUNTER — Ambulatory Visit: Payer: Medicare HMO | Admitting: Cardiovascular Disease

## 2022-08-26 ENCOUNTER — Encounter: Payer: Self-pay | Admitting: Cardiovascular Disease

## 2022-08-26 ENCOUNTER — Ambulatory Visit: Payer: Medicare HMO | Attending: Cardiovascular Disease | Admitting: Cardiovascular Disease

## 2022-08-26 VITALS — BP 128/60 | HR 74 | Ht 66.0 in | Wt 210.4 lb

## 2022-08-26 DIAGNOSIS — I2510 Atherosclerotic heart disease of native coronary artery without angina pectoris: Secondary | ICD-10-CM

## 2022-08-26 DIAGNOSIS — I739 Peripheral vascular disease, unspecified: Secondary | ICD-10-CM | POA: Diagnosis not present

## 2022-08-26 DIAGNOSIS — E785 Hyperlipidemia, unspecified: Secondary | ICD-10-CM

## 2022-08-26 DIAGNOSIS — R0989 Other specified symptoms and signs involving the circulatory and respiratory systems: Secondary | ICD-10-CM | POA: Diagnosis not present

## 2022-08-26 NOTE — Progress Notes (Signed)
Cardiology Office Note   Date:  08/26/2022   ID:  CHIRSTINE Davidson, DOB [birth year], MRN [id number]  PCP:  Theotis Burrow, MD  Cardiologist: Dr. Rockey Situ  Chief Complaint  Patient presents with   other    F/u LE [place] no complaints today. Meds reviewed verbally with pt.      History of Present Illness: Jasmine Davidson is a 66 y.o. female who is here today for follow-up visit regarding peripheral arterial disease.   She has history of mild to moderate nonobstructive coronary artery disease, diabetes mellitus, essential hypertension, hyperlipidemia, stage III chronic kidney disease and peripheral arterial disease.  She has followed in the past at [facility] vascular surgery clinic for chronic venous insufficiency status post right GSV stripping and left GSV venous ablation.  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  She is followed for left calf claudication.  Vascular studies in September 2022 showed an ABI of 0.97 on the right and 0.52 on the left.  Duplex showed no significant disease on the right side.  On the left side, there was suspected significant disease versus short occlusion in the distal SFA/proximal popliteal artery.  Repeat Doppler this month showed stable ABI of 0.59 on the left and normal on the right.  She reports worsening left calf claudication which is currently happening after walking about 50 feet.  In addition, she noticed worsening pain in the left foot most noticeable at night when she is trying to sleep.  She frequently wakes up and has to dangle her foot down on the side of the bed to relieve the pain.  No lower extremity ulceration.  She had an emergency room visit in August for headache, vertigo and elevated blood pressure in the setting of heat exposure.  She is scheduled to see neurology but feels better overall.  Past Medical History:  Diagnosis Date   CAD (coronary artery disease)     CKD (chronic kidney disease) stage 3, GFR 30-59 ml/min (HCC)    Diabetes mellitus    HTN (hypertension)    Hyperlipidemia    Obesity     Past Surgical History:  Procedure Laterality Date   BLADDER REPAIR W/ CESAREAN SECTION  1981   BREAST BIOPSY Right 12/17/2014   negative stereotactic   CARDIAC CATHETERIZATION     COLONOSCOPY WITH PROPOFOL N/A 03/30/2021   Procedure: COLONOSCOPY WITH PROPOFOL;  Surgeon: Lesly Rubenstein, MD;  Location: [facility] ENDOSCOPY;  Service: Endoscopy;  Laterality: N/A;  DM   cyst taken off finger  2011   ESOPHAGOGASTRODUODENOSCOPY (EGD) WITH PROPOFOL N/A 03/30/2021   Procedure: ESOPHAGOGASTRODUODENOSCOPY (EGD) WITH PROPOFOL;  Surgeon: Lesly Rubenstein, MD;  Location: [facility] ENDOSCOPY;  Service: Endoscopy;  Laterality: N/A;   LEFT HEART CATH AND CORONARY ANGIOGRAPHY N/A 07/21/2020   Procedure: LEFT HEART CATH AND CORONARY ANGIOGRAPHY;  Surgeon: Nelva Bush, MD;  Location: [facility];  Service: Cardiovascular;  Laterality: N/A;   TOE SURGERY  02/08/2013     Current Outpatient Medications  Medication Sig Dispense Refill   aspirin (ASPIRIN 81) 81 MG EC tablet Take 81 mg by mouth daily.     atorvastatin (LIPITOR) 40 MG tablet TAKE ONE TABLET BY MOUTH EVERY DAY 30 tablet 11   butalbital-acetaminophen-caffeine (FIORICET) 50-325-40 MG tablet Take 2 tablets by mouth every 6 (six) hours as needed for headache or migraine. 30 tablet 0   carvedilol (COREG) 12.5 MG tablet  TAKE 1 TABLET BY MOUTH 2 TIMES A DAY WITH A MEAL 180 tablet 1   cetirizine (ZYRTEC) 10 MG tablet Take 10 mg by mouth daily as needed.     Cholecalciferol (EQL VITAMIN D3) 25 MCG (1000 UT) capsule Take 1,000 Units by mouth daily.     empagliflozin (JARDIANCE) 10 MG TABS tablet Take 1 tablet (10 mg total) by mouth daily. 30 tablet 11   Ferrous Sulfate (IRON) 325 (65 Fe) MG TABS Take by mouth.     fluticasone (FLONASE) 50 MCG/ACT nasal spray SMARTSIG:1-2 Spray(s) Both Nares Daily     furosemide  (LASIX) 20 MG tablet Take 1 tablet (20 mg total) by mouth daily as needed. 30 tablet 11   gabapentin (NEURONTIN) 300 MG capsule Take 300 mg by mouth 2 (two) times daily.      liraglutide (VICTOZA) 18 MG/3ML SOPN Inject 0.6 mg into the skin daily.     lisinopril (ZESTRIL) 10 MG tablet TAKE 1 TABLET BY MOUTH ONCE DAILY. 90 tablet 1   metFORMIN (GLUMETZA) 1000 MG (MOD) 24 hr tablet Take 1,000 mg by mouth 2 (two) times daily with a meal.     vitamin B-12 (CYANOCOBALAMIN) 1000 MCG tablet Take 1,000 mcg by mouth daily.     No current facility-administered medications for this visit.    Allergies:   Pregabalin    Social History:  The patient  reports that she has never smoked. She has never used smokeless tobacco. She reports that she does not drink alcohol and does not use drugs.   Family History:  The patient's family history includes COPD in her mother; Diabetes in her father; Heart failure in her father; Hypertension in her father; Lupus in her father.    ROS:  Please see the history of present illness.   Otherwise, review of systems are positive for none.   All other systems are reviewed and negative.    PHYSICAL EXAM: VS:  BP 128/60 (BP Location: Left Arm, Patient Position: Sitting, Cuff Size: Normal)   Pulse 74   Ht 5\' 6"  (1.676 m)   Wt 210 lb 6 oz (95.4 kg)   LMP 09/12/1999   SpO2 98%   BMI 33.96 kg/m  , BMI Body mass index is 33.96 kg/m. GEN: Well nourished, well developed, in no acute distress  HEENT: normal  Neck: no JVD  or masses.  Left carotid bruit Cardiac: RRR; no murmurs, rubs, or gallops,no edema  Respiratory:  clear to auscultation bilaterally, normal work of breathing GI: soft, nontender, nondistended, + BS MS: no deformity or atrophy  Skin: warm and dry, no rash Neuro:  Strength and sensation are intact Psych: euthymic mood, full affect Vascular: Femoral pulses normal bilaterally.  Distal pulses are palpable on the right side but not the left side.   EKG:   EKG is ordered today. The ekg ordered today demonstrates normal sinus rhythm with left axis deviation with poor R wave progression in the anterior leads.   Recent Labs: 07/27/2022: BUN 22; Creatinine, Ser 1.21; Hemoglobin 14.1; Platelets 123; Potassium 4.0; Sodium 138    Lipid Panel    Component Value Date/Time   CHOL 108 07/20/2020 0434   TRIG 88 07/20/2020 0434   TRIG 49 11/16/2009 0000   HDL 28 (L) 07/20/2020 0434   CHOLHDL 3.9 07/20/2020 0434   VLDL 18 07/20/2020 0434   LDLCALC 62 07/20/2020 0434   LDLCALC 58 11/16/2009 0000      Wt Readings from Last 3 Encounters:  08/26/22  210 lb 6 oz (95.4 kg)  12/27/21 220 lb (99.8 kg)  08/19/21 221 lb 6 oz (100.4 kg)           No data to display            ASSESSMENT AND PLAN:  1.  Peripheral arterial disease: The patient's left calf claudication has worsened significantly over the last year and seems to be lifestyle limiting at this point.  In addition, she seems to be having left foot rest pain mostly at night.  Due to that, I recommend proceeding with abdominal aortogram with left lower extremity angiography and possible endovascular intervention.  She does have underlying chronic kidney disease but most recent creatinine was 1.2.  We will try to minimize contrast.  I discussed the procedure in details as well as risk and benefits.  2.  Coronary artery disease involving native coronary arteries without angina: She reports no anginal symptoms.  Continue medical therapy.  3.  Hyperlipidemia: Continue atorvastatin 40 mg once daily with a target LDL of less than 70.  4.  Essential hypertension: Blood pressure is well controlled on current medications.  5.  Left carotid bruit: Previous carotid Doppler in 2021 showed mild nonobstructive coronary artery disease.  I requested a follow-up carotid Doppler.    Disposition:   Proceed with an angiogram and follow-up after.  Signed,  Kathlyn Sacramento, MD  08/26/2022 8:48 AM     [place]

## 2022-08-26 NOTE — H&P (View-Only) (Signed)
Cardiology Office Note   Date:  08/26/2022   ID:  Jasmine Davidson, DOB 07-07-1956, MRN [id number]  PCP:  Theotis Burrow, MD  Cardiologist: Dr. Rockey Situ  Chief Complaint  Patient presents with   other    F/u LE [place] no complaints today. Meds reviewed verbally with pt.      History of Present Illness: Jasmine Davidson is a 66 y.o. female who is here today for follow-up visit regarding peripheral arterial disease.   She has history of mild to moderate nonobstructive coronary artery disease, diabetes mellitus, essential hypertension, hyperlipidemia, stage III chronic kidney disease and peripheral arterial disease.  She has followed in the past at [facility] vascular surgery clinic for chronic venous insufficiency status post right GSV stripping and left GSV venous ablation.  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  She is followed for left calf claudication.  Vascular studies in September 2022 showed an ABI of 0.97 on the right and 0.52 on the left.  Duplex showed no significant disease on the right side.  On the left side, there was suspected significant disease versus short occlusion in the distal SFA/proximal popliteal artery.  Repeat Doppler this month showed stable ABI of 0.59 on the left and normal on the right.  She reports worsening left calf claudication which is currently happening after walking about 50 feet.  In addition, she noticed worsening pain in the left foot most noticeable at night when she is trying to sleep.  She frequently wakes up and has to dangle her foot down on the side of the bed to relieve the pain.  No lower extremity ulceration.  She had an emergency room visit in August for headache, vertigo and elevated blood pressure in the setting of heat exposure.  She is scheduled to see neurology but feels better overall.  Past Medical History:  Diagnosis Date   CAD (coronary artery disease)     CKD (chronic kidney disease) stage 3, GFR 30-59 ml/min (HCC)    Diabetes mellitus    HTN (hypertension)    Hyperlipidemia    Obesity     Past Surgical History:  Procedure Laterality Date   BLADDER REPAIR W/ CESAREAN SECTION  1981   BREAST BIOPSY Right 12/17/2014   negative stereotactic   CARDIAC CATHETERIZATION     COLONOSCOPY WITH PROPOFOL N/A 03/30/2021   Procedure: COLONOSCOPY WITH PROPOFOL;  Surgeon: Jasmine Rubenstein, MD;  Location: [facility] ENDOSCOPY;  Service: Endoscopy;  Laterality: N/A;  DM   cyst taken off finger  2011   ESOPHAGOGASTRODUODENOSCOPY (EGD) WITH PROPOFOL N/A 03/30/2021   Procedure: ESOPHAGOGASTRODUODENOSCOPY (EGD) WITH PROPOFOL;  Surgeon: Jasmine Rubenstein, MD;  Location: [facility] ENDOS[facility];  Service: Endoscopy;  Laterality: N/A;   LEFT HEART CATH AND CORONARY ANGIOGRAPHY N/A 07/21/2020   Procedure: LEFT HEART CATH AND CORONARY ANGIOGRAPHY;  Surgeon: Jasmine Bush, MD;  Location: [facility];  Service: Cardiovascular;  Laterality: N/A;   TOE SURGERY  02/08/2013     Current Outpatient Medications  Medication Sig Dispense Refill   aspirin (ASPIRIN 81) 81 MG EC tablet Take 81 mg by mouth daily.     atorvastatin (LIPITOR) 40 MG tablet TAKE ONE TABLET BY MOUTH EVERY DAY 30 tablet 11   butalbital-acetaminophen-caffeine (FIORICET) 50-325-40 MG tablet Take 2 tablets by mouth every 6 (six) hours as needed for headache or migraine. 30 tablet 0   carvedilol (COREG) 12.5 MG tablet  TAKE 1 TABLET BY MOUTH 2 TIMES A DAY WITH A MEAL 180 tablet 1   cetirizine (ZYRTEC) 10 MG tablet Take 10 mg by mouth daily as needed.     Cholecalciferol (EQL VITAMIN D3) 25 MCG (1000 UT) capsule Take 1,000 Units by mouth daily.     empagliflozin (JARDIANCE) 10 MG TABS tablet Take 1 tablet (10 mg total) by mouth daily. 30 tablet 11   Ferrous Sulfate (IRON) 325 (65 Fe) MG TABS Take by mouth.     fluticasone (FLONASE) 50 MCG/ACT nasal spray SMARTSIG:1-2 Spray(s) Both Nares Daily     furosemide  (LASIX) 20 MG tablet Take 1 tablet (20 mg total) by mouth daily as needed. 30 tablet 11   gabapentin (NEURONTIN) 300 MG capsule Take 300 mg by mouth 2 (two) times daily.      liraglutide (VICTOZA) 18 MG/3ML SOPN Inject 0.6 mg into the skin daily.     lisinopril (ZESTRIL) 10 MG tablet TAKE 1 TABLET BY MOUTH ONCE DAILY. 90 tablet 1   metFORMIN (GLUMETZA) 1000 MG (MOD) 24 hr tablet Take 1,000 mg by mouth 2 (two) times daily with a meal.     vitamin B-12 (CYANOCOBALAMIN) 1000 MCG tablet Take 1,000 mcg by mouth daily.     No current facility-administered medications for this visit.    Allergies:   Pregabalin    Social History:  The patient  reports that she has never smoked. She has never used smokeless tobacco. She reports that she does not drink alcohol and does not use drugs.   Family History:  The patient's family history includes COPD in her mother; Diabetes in her father; Heart failure in her father; Hypertension in her father; Lupus in her father.    ROS:  Please see the history of present illness.   Otherwise, review of systems are positive for none.   All other systems are reviewed and negative.    PHYSICAL EXAM: VS:  BP 128/60 (BP Location: Left Arm, Patient Position: Sitting, Cuff Size: Normal)   Pulse 74   Ht 5\' 6"  (1.676 m)   Wt 210 lb 6 oz (95.4 kg)   LMP 09/12/1999   SpO2 98%   BMI 33.96 kg/m  , BMI Body mass index is 33.96 kg/m. GEN: Well nourished, well developed, in no acute distress  HEENT: normal  Neck: no JVD  or masses.  Left carotid bruit Cardiac: RRR; no murmurs, rubs, or gallops,no edema  Respiratory:  clear to auscultation bilaterally, normal work of breathing GI: soft, nontender, nondistended, + BS MS: no deformity or atrophy  Skin: warm and dry, no rash Neuro:  Strength and sensation are intact Psych: euthymic mood, full affect Vascular: Femoral pulses normal bilaterally.  Distal pulses are palpable on the right side but not the left side.   EKG:   EKG is ordered today. The ekg ordered today demonstrates normal sinus rhythm with left axis deviation with poor R wave progression in the anterior leads.   Recent Labs: 07/27/2022: BUN 22; Creatinine, Ser 1.21; Hemoglobin 14.1; Platelets 123; Potassium 4.0; Sodium 138    Lipid Panel    Component Value Date/Time   CHOL 108 07/20/2020 0434   TRIG 88 07/20/2020 0434   TRIG 49 11/16/2009 0000   HDL 28 (L) 07/20/2020 0434   CHOLHDL 3.9 07/20/2020 0434   VLDL 18 07/20/2020 0434   LDLCALC 62 07/20/2020 0434   LDLCALC 58 11/16/2009 0000      Wt Readings from Last 3 Encounters:  08/26/22  210 lb 6 oz (95.4 kg)  12/27/21 220 lb (99.8 kg)  08/19/21 221 lb 6 oz (100.4 kg)           No data to display            ASSESSMENT AND PLAN:  1.  Peripheral arterial disease: The patient's left calf claudication has worsened significantly over the last year and seems to be lifestyle limiting at this point.  In addition, she seems to be having left foot rest pain mostly at night.  Due to that, I recommend proceeding with abdominal aortogram with left lower extremity angiography and possible endovascular intervention.  She does have underlying chronic kidney disease but most recent creatinine was 1.2.  We will try to minimize contrast.  I discussed the procedure in details as well as risk and benefits.  2.  Coronary artery disease involving native coronary arteries without angina: She reports no anginal symptoms.  Continue medical therapy.  3.  Hyperlipidemia: Continue atorvastatin 40 mg once daily with a target LDL of less than 70.  4.  Essential hypertension: Blood pressure is well controlled on current medications.  5.  Left carotid bruit: Previous carotid Doppler in 2021 showed mild nonobstructive coronary artery disease.  I requested a follow-up carotid Doppler.    Disposition:   Proceed with an angiogram and follow-up after.  Signed,  Kathlyn Sacramento, MD  08/26/2022 8:48 AM     [place]

## 2022-08-26 NOTE — Patient Instructions (Addendum)
Medication Instructions:  Your physician recommends that you continue on your current medications as directed. Please refer to the Current Medication list given to you today.  *If you need a refill on your cardiac medications before your next appointment, please call your pharmacy*   Lab Work: Your physician recommends that you return for lab work (bmp, cbc) : See instructions below   If you have labs (blood work) drawn today and your tests are completely normal, you will receive your results only by: MyChart Message (if you have MyChart) OR A paper copy in the mail If you have any lab test that is abnormal or we need to change your treatment, we will call you to review the results.   Testing/Procedures: Dr. Fletcher Anon has recommended that you have an abdominal aortogram   Your physician has requested that you have a carotid duplex. This test is an ultrasound of the carotid arteries in your neck. It looks at blood flow through these arteries that supply the brain with blood. Allow one hour for this exam. There are no restrictions or special instructions.    Follow-Up: At [facility], you and your health needs are our priority.  As part of our continuing mission to provide you with exceptional heart care, we have created designated Provider Care Teams.  These Care Teams include your primary Cardiologist (physician) and Advanced Practice Providers (APPs -  Physician Assistants and Nurse Practitioners) who all work together to provide you with the care you need, when you need it.  We recommend signing up for the patient portal called "MyChart".  Sign up information is provided on this After Visit Summary.  MyChart is used to connect with patients for Virtual Visits (Telemedicine).  Patients are able to view lab/test results, encounter notes, upcoming appointments, etc.  Non-urgent messages can be sent to your provider as well.   To learn more about what you can do with MyChart, go to  [email or web address].    Your next appointment:   5 week(s)  The format for your next appointment:   In Person  Provider:   You may see Kathlyn Sacramento, MD or one of the following Advanced Practice Providers on your designated Care Team:   Murray Hodgkins, NP Christell Faith, PA-C Cadence Kathlen Mody, PA-C Gerrie Nordmann, NP   Other Instructions  [place] A DEPT OF [place] A DEPT OF [place]. [facility] [place], [place] [facility] [id number] Dept: [place]: [place]  08/26/2022  You are scheduled for a Peripheral Angiogram on Wednesday, October 4 with Dr. Kathlyn Sacramento.  1. Please arrive at the [facility] (Main Entrance A) at [facility]: [address] [place], [place] [address] at 8:30 AM (This time is two hours before your procedure to ensure your preparation). Free valet parking service is available.   Special note: Every effort is made to have your procedure done on time. Please understand that emergencies sometimes delay scheduled procedures.  2. Diet: Do not eat solid foods after midnight.  The patient may have clear liquids until 5am upon the day of the procedure.  3. Labs: You will need to have blood drawn on Monday, October 2 at [facility], Go to 1st desk on your right to register.  Address: [place], [place] [address]  Open: 8am - 5pm  Phone: [phone]. You do not need to be fasting.  4. Medication instructions in preparation for your procedure:   Contrast Allergy: No       Stop taking, Lasix (Furosemide)  Wednesday, October 4,  Do Not take Jardiance the morning of the procedure  Do Not take Victoza   Do not take Diabetes Med Glucophage (Metformin) on the day of the procedure and HOLD 48 HOURS AFTER THE PROCEDURE.  On the morning of your procedure, take your Aspirin and any morning medicines NOT  listed above.  You may use sips of water.  5. Plan for one night stay--bring personal belongings. 6. Bring a current list of your medications and current insurance cards. 7. You MUST have a responsible person to drive you home. 8. Someone MUST be with you the first 24 hours after you arrive home or your discharge will be delayed. 9. Please wear clothes that are easy to get on and off and wear slip-on shoes.  Thank you for allowing [place] to care for you!   -- [facility]

## 2022-08-26 NOTE — Addendum Note (Signed)
Addended by: Britt Bottom on: 08/26/2022 02:12 PM   Modules accepted: Orders

## 2022-09-09 IMAGING — US US THYROID
1 series · 12 of 25 positions shown · non-contrast
Comparison: None Available.

CLINICAL DATA: 66-year-old female with history of thyroid nodule.

EXAM:
THYROID ULTRASOUND
TECHNIQUE: Ultrasound examination of the thyroid gland and adjacent soft
tissues was performed.

[Series 1: us thyroid · 0.05mm/px · 12 of 96 slices shown]
[im 4/96]
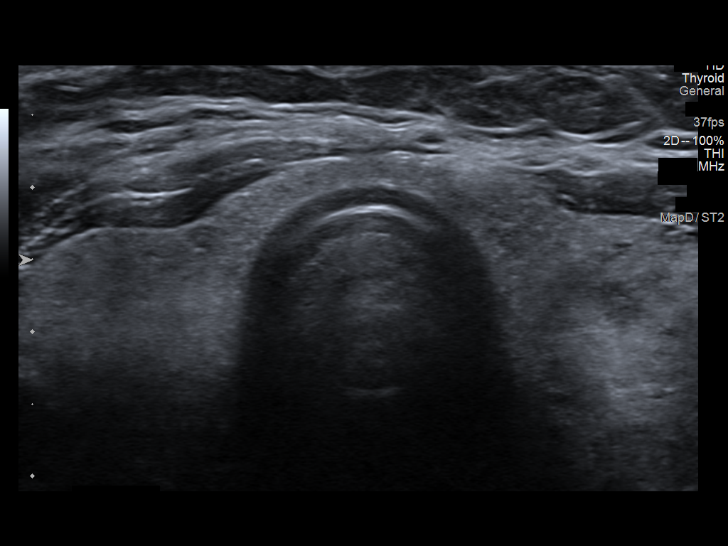
[im 12/96]
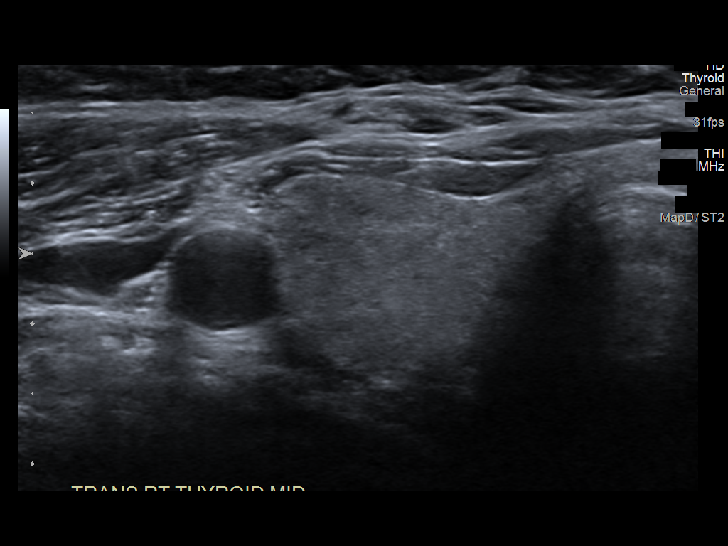
[im 20/96]
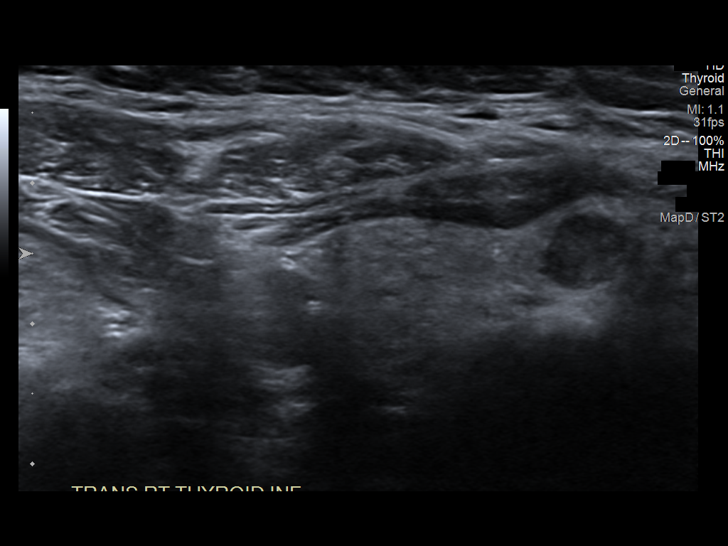
[im 28/96]
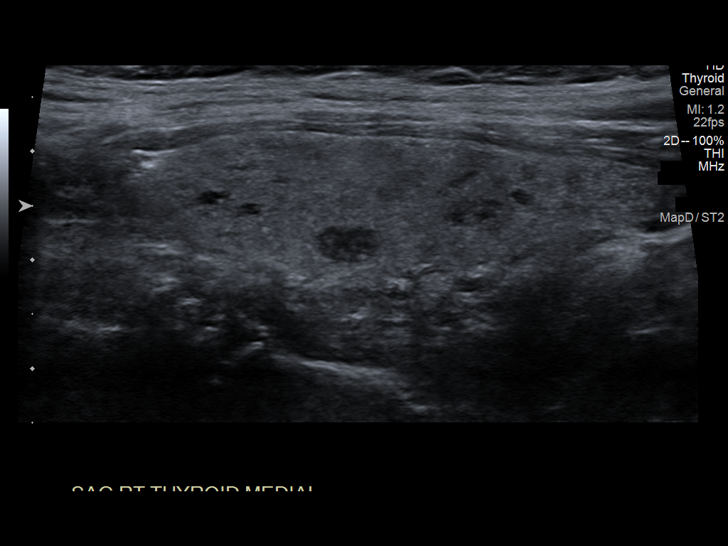
[im 36/96]
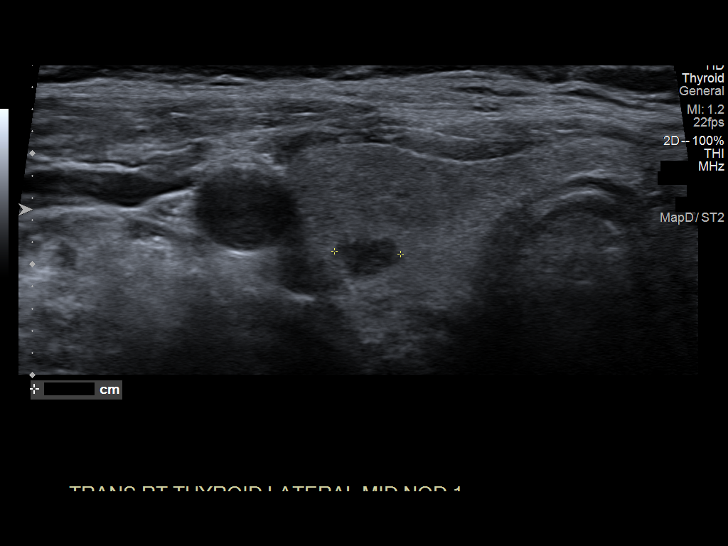
[im 44/96]
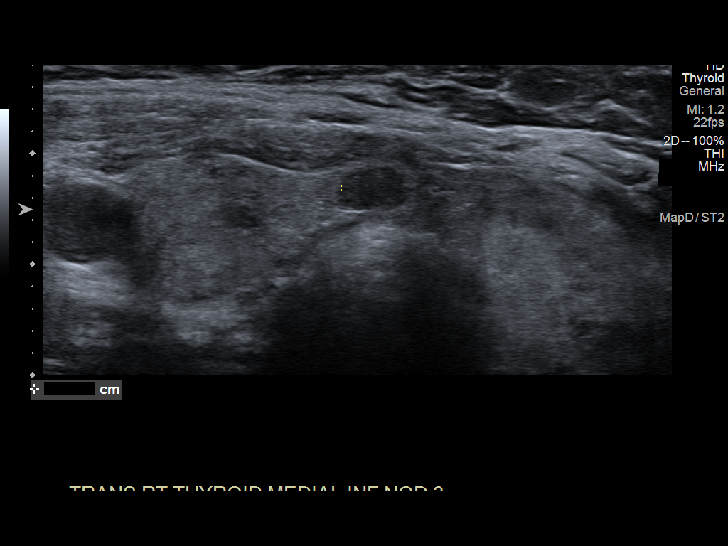
[im 52/96]
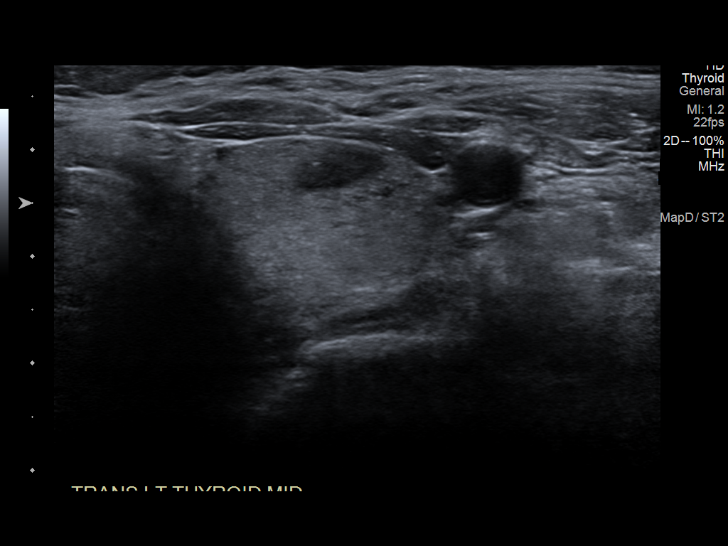
[im 60/96]
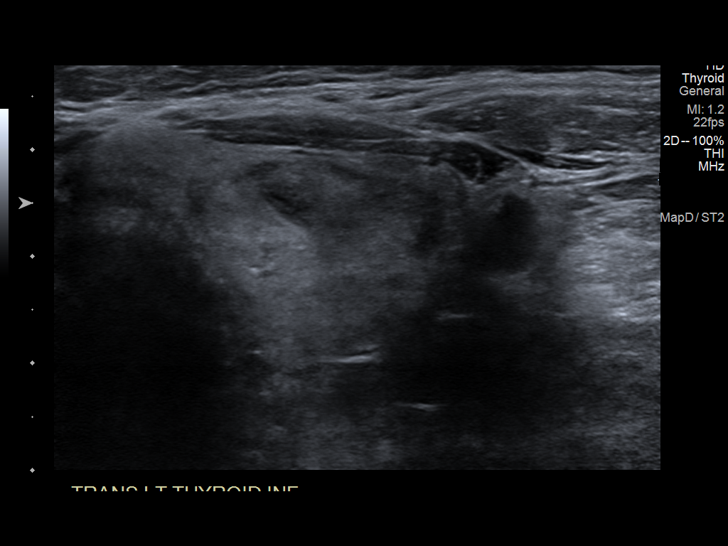
[im 68/96]
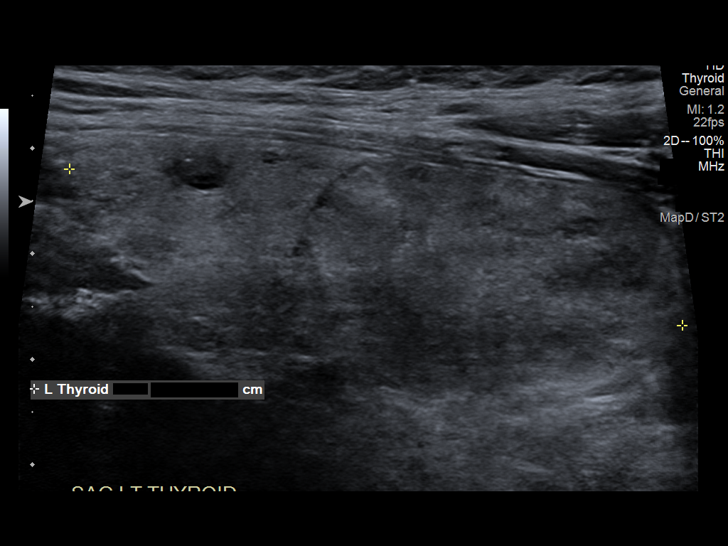
[im 76/96]
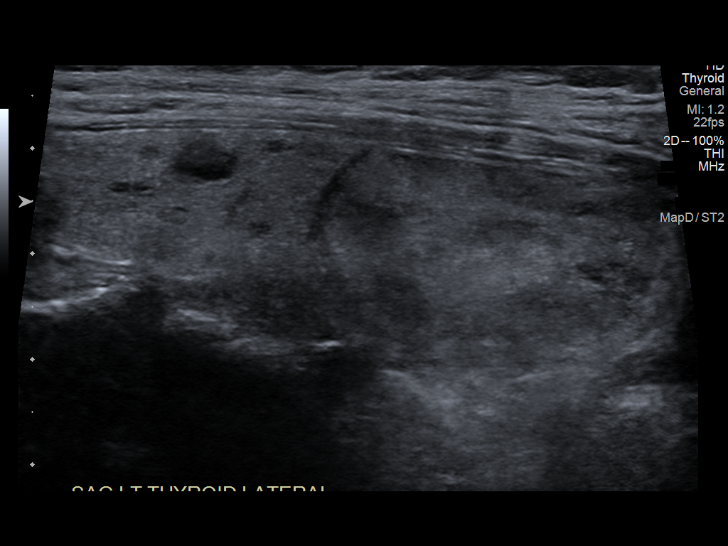
[im 84/96]
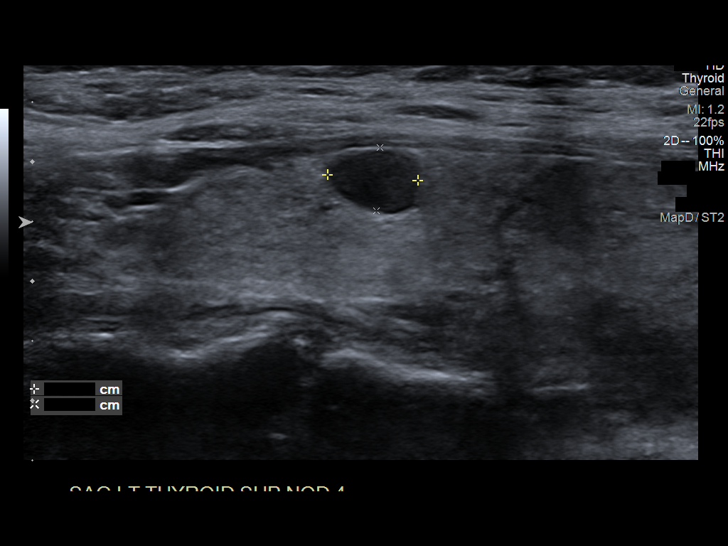
[im 92/96]
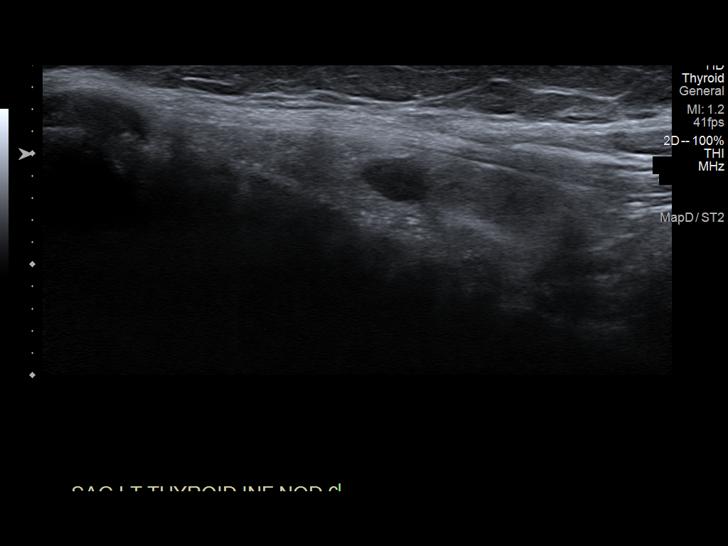

[12 of 25 positions shown; findings below may reference images not displayed]

FINDINGS: Parenchymal Echotexture: Mildly heterogeneous

Isthmus: 0.2 cm

Right lobe: 4.9 x 1.6 x 1.8 cm

Left lobe: 6.0 x 2.6 x 2.0 cm

________________________________________________________

Estimated total number of nodules >/= 1 cm: 3

Number of spongiform nodules >/=  2 cm not described below (TR1): 0

Number of mixed cystic and solid nodules >/= 1.5 cm not described
below (TR2): 0

_________________________________________________________

Nodule # 1:

Location: Right; Superior

Maximum size: 0.6 cm; Other 2 dimensions: 0.6 x 0.3 cm

Composition: solid/almost completely solid (2)

Echogenicity: hypoechoic (2)

Shape: not taller-than-wide (0)

Margins: smooth (0)

Echogenic foci: none (0)

ACR TI-RADS total points: 4.

ACR TI-RADS risk category: TR4 (4-6 points).

ACR TI-RADS recommendations:

Given size (<0.9 cm) and appearance, this nodule does NOT meet
TI-RADS criteria for biopsy or dedicated follow-up.

_________________________________________________________

Nodule # 2:

Location: Right; Mid

Maximum size: 1.1 cm; Other 2 dimensions: 0.8 x 0.7 cm

Composition: solid/almost completely solid (2)

Echogenicity: isoechoic (1)

Shape: not taller-than-wide (0)

Margins: smooth (0)

Echogenic foci: none (0)

ACR TI-RADS total points: 3.

ACR TI-RADS risk category: TR3 (3 points).

ACR TI-RADS recommendations:

Given size (<1.4 cm) and appearance, this nodule does NOT meet
TI-RADS criteria for biopsy or dedicated follow-up.

_________________________________________________________

Nodule # 3:

Location: Right; Inferior

Maximum size: 0.7 cm; Other 2 dimensions: 0.6 x 0.4 cm

Composition: solid/almost completely solid (2)

Echogenicity: hypoechoic (2)

Shape: not taller-than-wide (0)

Margins: smooth (0)

Echogenic foci: none (0)

ACR TI-RADS total points: 4.

ACR TI-RADS risk category: TR4 (4-6 points).

ACR TI-RADS recommendations:

Given size (<0.9 cm) and appearance, this nodule does NOT meet
TI-RADS criteria for biopsy or dedicated follow-up.

_________________________________________________________

Nodule # 4:

Location: Left; Superior

Maximum size: 1.0 cm; Other 2 dimensions: 0.8 x 0.5 cm

Composition: solid/almost completely solid (2)

Echogenicity: very hypoechoic (3)

Shape: not taller-than-wide (0)

Margins: smooth (0)

Echogenic foci: none (0)

ACR TI-RADS total points: 5.

ACR TI-RADS risk category: TR4 (4-6 points).

ACR TI-RADS recommendations:

*Given size (>/= 1 - 1.4 cm) and appearance, a follow-up ultrasound
in 1 year should be considered based on TI-RADS criteria.

_________________________________________________________

Nodule # 5:

Location: Left; Inferior

Maximum size: 3.4 cm; Other 2 dimensions: 1.9 x 2.9 cm

Composition: solid/almost completely solid (2)

Echogenicity: isoechoic (1)

Shape: not taller-than-wide (0)

Margins: smooth (0)

Echogenic foci: none (0)

ACR TI-RADS total points: 3.

ACR TI-RADS risk category: TR3 (3 points).

ACR TI-RADS recommendations:

**Given size (>/= 2.5 cm) and appearance, fine needle aspiration of
this mildly suspicious nodule should be considered based on TI-RADS
criteria.

_________________________________________________________

Nodule # 6:

Location: Left; Mid

Maximum size: 0.6 cm; Other 2 dimensions: 0.4 x 0.4 cm

Composition: solid/almost completely solid (2)

Echogenicity: very hypoechoic (3)

Shape: not taller-than-wide (0)

Margins: smooth (0)

Echogenic foci: none (0)

ACR TI-RADS total points: 5.

ACR TI-RADS risk category: TR4 (4-6 points).

ACR TI-RADS recommendations:

Given size (<0.9 cm) and appearance, this nodule does NOT meet
TI-RADS criteria for biopsy or dedicated follow-up.

_________________________________________________________

No cervical lymphadenopathy.
IMPRESSION: 1. Multinodular goiter.
2. Solid nodule in the left inferior thyroid (labeled 5, 3.4 cm)
meets criteria (TI-RADS category 3) for tissue sampling. Recommend
ultrasound-guided fine-needle aspiration if not already performed.
3. Solid nodule in the left superior thyroid (labeled 4, 1.0 cm)
meets criteria (TI-RADS category 4) for 1 year ultrasound
surveillance.
4. The remaining visualized thyroid nodules appear benign and do not
warrant additional follow-up.

The above is in keeping with the ACR TI-RADS recommendations - [HOSPITAL] 5097;[DATE].

## 2022-09-13 ENCOUNTER — Ambulatory Visit: Payer: Medicare HMO | Attending: Cardiovascular Disease

## 2022-09-13 ENCOUNTER — Other Ambulatory Visit
Admission: RE | Admit: 2022-09-13 | Discharge: 2022-09-13 | Disposition: A | Discharge status: Home / Self Care | Payer: Medicare HMO | Source: Ambulatory Visit | Attending: Cardiovascular Disease | Admitting: Cardiovascular Disease

## 2022-09-13 ENCOUNTER — Telehealth: Payer: Self-pay | Admitting: *Deleted

## 2022-09-13 DIAGNOSIS — I739 Peripheral vascular disease, unspecified: Secondary | ICD-10-CM | POA: Diagnosis present

## 2022-09-13 DIAGNOSIS — I70229 Atherosclerosis of native arteries of extremities with rest pain, unspecified extremity: Secondary | ICD-10-CM | POA: Diagnosis not present

## 2022-09-13 DIAGNOSIS — R0989 Other specified symptoms and signs involving the circulatory and respiratory systems: Secondary | ICD-10-CM | POA: Diagnosis not present

## 2022-09-13 LAB — CBC WITH DIFFERENTIAL/PLATELET
Abs Immature Granulocytes: 0 10*3/uL (ref 0.00–0.07)
Basophils Absolute: 0 10*3/uL (ref 0.0–0.1)
Basophils Relative: 1 %
Eosinophils Absolute: 0.1 10*3/uL (ref 0.0–0.5)
Eosinophils Relative: 2 %
HCT: 38.2 % (ref 36.0–46.0)
Hemoglobin: 12.3 g/dL (ref 12.0–15.0)
Immature Granulocytes: 0 %
Lymphocytes Relative: 23 %
Lymphs Abs: 1 10*3/uL (ref 0.7–4.0)
MCH: 31.3 pg (ref 26.0–34.0)
MCHC: 32.2 g/dL (ref 30.0–36.0)
MCV: 97.2 fL (ref 80.0–100.0)
Monocytes Absolute: 0.2 10*3/uL (ref 0.1–1.0)
Monocytes Relative: 6 %
Neutro Abs: 2.9 10*3/uL (ref 1.7–7.7)
Neutrophils Relative %: 68 %
Platelets: 112 10*3/uL — ABNORMAL LOW (ref 150–400)
RBC: 3.93 MIL/uL (ref 3.87–5.11)
RDW: 12.7 % (ref 11.5–15.5)
WBC: 4.2 10*3/uL (ref 4.0–10.5)
nRBC: 0 % (ref 0.0–0.2)

## 2022-09-13 LAB — BASIC METABOLIC PANEL
Anion gap: 7 (ref 5–15)
BUN: 27 mg/dL — ABNORMAL HIGH (ref 8–23)
CO2: 25 mmol/L (ref 22–32)
Calcium: 8.9 mg/dL (ref 8.9–10.3)
Chloride: 105 mmol/L (ref 98–111)
Creatinine, Ser: 1.17 mg/dL — ABNORMAL HIGH (ref 0.44–1.00)
GFR, Estimated: 52 mL/min — ABNORMAL LOW (ref >60)
Glucose, Bld: 255 mg/dL — ABNORMAL HIGH (ref 70–99)
Potassium: 4.3 mmol/L (ref 3.5–5.1)
Sodium: 137 mmol/L (ref 135–145)

## 2022-09-13 NOTE — Telephone Encounter (Addendum)
Abdominal aortogram  scheduled at [facility] for: Wednesday September 14, 2022 10:30 AM Arrival time and place: [place] Entrance A at: 8:30 AM  Nothing to eat after midnight prior to procedure, clear liquids until 5 AM day of procedure.  Medication instructions: -Hold: Jardiance/Victoza-AM of procedure  Metformin-day of procedure and 48 hours post procedure  Lasix-AM of procedure   Lisinopril-PM prior to procedure-per protocol GFR 52  -Except hold medications usual morning medications can be taken with sips of water including aspirin 81 mg.  Confirmed patient has responsible adult to drive home post procedure and be with patient first 24 hours after arriving home.  Patient reports no new symptoms concerning for COVID-19 in the past 10 days.  Reviewed procedure instructions with patient.

## 2022-09-14 ENCOUNTER — Other Ambulatory Visit: Payer: Self-pay

## 2022-09-14 ENCOUNTER — Ambulatory Visit (HOSPITAL_COMMUNITY)
Admission: RE | Admit: 2022-09-14 | Discharge: 2022-09-14 | Disposition: A | Discharge status: Home / Self Care | Payer: Medicare HMO | Attending: Cardiovascular Disease | Admitting: Cardiovascular Disease

## 2022-09-14 ENCOUNTER — Encounter (HOSPITAL_COMMUNITY)
Admission: RE | Disposition: A | Discharge status: Home / Self Care | Payer: Self-pay | Source: Home / Self Care | Attending: Cardiovascular Disease

## 2022-09-14 ENCOUNTER — Telehealth: Payer: Self-pay

## 2022-09-14 DIAGNOSIS — I129 Hypertensive chronic kidney disease with stage 1 through stage 4 chronic kidney disease, or unspecified chronic kidney disease: Secondary | ICD-10-CM | POA: Insufficient documentation

## 2022-09-14 DIAGNOSIS — I70212 Atherosclerosis of native arteries of extremities with intermittent claudication, left leg: Secondary | ICD-10-CM | POA: Diagnosis not present

## 2022-09-14 DIAGNOSIS — E785 Hyperlipidemia, unspecified: Secondary | ICD-10-CM | POA: Diagnosis not present

## 2022-09-14 DIAGNOSIS — I700 Atherosclerosis of aorta: Secondary | ICD-10-CM | POA: Diagnosis not present

## 2022-09-14 DIAGNOSIS — Z79899 Other long term (current) drug therapy: Secondary | ICD-10-CM | POA: Insufficient documentation

## 2022-09-14 DIAGNOSIS — E1151 Type 2 diabetes mellitus with diabetic peripheral angiopathy without gangrene: Secondary | ICD-10-CM | POA: Diagnosis not present

## 2022-09-14 DIAGNOSIS — Z7985 Long-term (current) use of injectable non-insulin antidiabetic drugs: Secondary | ICD-10-CM | POA: Diagnosis not present

## 2022-09-14 DIAGNOSIS — N183 Chronic kidney disease, stage 3 unspecified: Secondary | ICD-10-CM | POA: Insufficient documentation

## 2022-09-14 DIAGNOSIS — E1122 Type 2 diabetes mellitus with diabetic chronic kidney disease: Secondary | ICD-10-CM | POA: Insufficient documentation

## 2022-09-14 DIAGNOSIS — I2510 Atherosclerotic heart disease of native coronary artery without angina pectoris: Secondary | ICD-10-CM | POA: Diagnosis not present

## 2022-09-14 DIAGNOSIS — I739 Peripheral vascular disease, unspecified: Secondary | ICD-10-CM

## 2022-09-14 DIAGNOSIS — Z7984 Long term (current) use of oral hypoglycemic drugs: Secondary | ICD-10-CM | POA: Insufficient documentation

## 2022-09-14 HISTORY — PX: PERIPHERAL VASCULAR BALLOON ANGIOPLASTY: CATH118281

## 2022-09-14 HISTORY — PX: ABDOMINAL AORTOGRAM W/LOWER EXTREMITY: CATH118223

## 2022-09-14 HISTORY — PX: PERIPHERAL VASCULAR ATHERECTOMY: CATH118256

## 2022-09-14 LAB — GLUCOSE, CAPILLARY: Glucose-Capillary: 146 mg/dL — ABNORMAL HIGH (ref 70–99)

## 2022-09-14 LAB — POCT ACTIVATED CLOTTING TIME: Activated Clotting Time: 239 seconds

## 2022-09-14 SURGERY — ABDOMINAL AORTOGRAM W/LOWER EXTREMITY
Anesthesia: LOCAL

## 2022-09-14 MED ORDER — ACETAMINOPHEN 325 MG PO TABS: 650.0000 mg | ORAL_TABLET | ORAL | Status: DC | PRN

## 2022-09-14 MED ORDER — SODIUM CHLORIDE 0.9 % IV SOLN: INTRAVENOUS | Status: AC

## 2022-09-14 MED ORDER — MIDAZOLAM HCL 2 MG/2ML IJ SOLN
INTRAMUSCULAR | Status: DC | PRN
  Administered 2022-09-14 (×2): 1 mg via INTRAVENOUS

## 2022-09-14 MED ORDER — HEPARIN (PORCINE) IN NACL 1000-0.9 UT/500ML-% IV SOLN
INTRAVENOUS | Status: AC
  Filled 2022-09-14: qty 1000

## 2022-09-14 MED ORDER — FENTANYL CITRATE (PF) 100 MCG/2ML IJ SOLN
INTRAMUSCULAR | Status: AC
  Filled 2022-09-14: qty 2

## 2022-09-14 MED ORDER — SODIUM CHLORIDE 0.9% FLUSH: 3.0000 mL | INTRAVENOUS | Status: DC | PRN

## 2022-09-14 MED ORDER — FENTANYL CITRATE (PF) 100 MCG/2ML IJ SOLN
INTRAMUSCULAR | Status: DC | PRN
  Administered 2022-09-14 (×2): 25 ug via INTRAVENOUS

## 2022-09-14 MED ORDER — LIDOCAINE HCL (PF) 1 % IJ SOLN
INTRAMUSCULAR | Status: DC | PRN
  Administered 2022-09-14: 15 mL via INTRADERMAL

## 2022-09-14 MED ORDER — HYDRALAZINE HCL 20 MG/ML IJ SOLN: 5.0000 mg | INTRAMUSCULAR | Status: DC | PRN

## 2022-09-14 MED ORDER — HEPARIN SODIUM (PORCINE) 1000 UNIT/ML IJ SOLN
INTRAMUSCULAR | Status: DC | PRN
  Administered 2022-09-14: 2000 [IU] via INTRAVENOUS
  Administered 2022-09-14: 8000 [IU] via INTRAVENOUS

## 2022-09-14 MED ORDER — ASPIRIN 81 MG PO CHEW: 81.0000 mg | CHEWABLE_TABLET | ORAL | Status: DC

## 2022-09-14 MED ORDER — SODIUM CHLORIDE 0.9% FLUSH: 3.0000 mL | Freq: Two times a day (BID) | INTRAVENOUS | Status: DC

## 2022-09-14 MED ORDER — SODIUM CHLORIDE 0.9 % WEIGHT BASED INFUSION
3.0000 mL/kg/h | INTRAVENOUS | Status: AC
  Administered 2022-09-14: 3 mL/kg/h via INTRAVENOUS

## 2022-09-14 MED ORDER — LIDOCAINE HCL (PF) 1 % IJ SOLN
INTRAMUSCULAR | Status: AC
  Filled 2022-09-14: qty 30

## 2022-09-14 MED ORDER — CLOPIDOGREL BISULFATE 300 MG PO TABS
ORAL_TABLET | ORAL | Status: AC
  Filled 2022-09-14: qty 1

## 2022-09-14 MED ORDER — HEPARIN (PORCINE) IN NACL 1000-0.9 UT/500ML-% IV SOLN
INTRAVENOUS | Status: DC | PRN
  Administered 2022-09-14 (×2): 500 mL

## 2022-09-14 MED ORDER — CLOPIDOGREL BISULFATE 75 MG PO TABS: 75.0000 mg | ORAL_TABLET | Freq: Every day | ORAL | 6 refills | Status: DC

## 2022-09-14 MED ORDER — SODIUM CHLORIDE 0.9 % WEIGHT BASED INFUSION: 1.0000 mL/kg/h | INTRAVENOUS | Status: DC

## 2022-09-14 MED ORDER — HEPARIN SODIUM (PORCINE) 1000 UNIT/ML IJ SOLN
INTRAMUSCULAR | Status: AC
  Filled 2022-09-14: qty 10

## 2022-09-14 MED ORDER — ONDANSETRON HCL 4 MG/2ML IJ SOLN: 4.0000 mg | Freq: Four times a day (QID) | INTRAMUSCULAR | Status: DC | PRN

## 2022-09-14 MED ORDER — MIDAZOLAM HCL 2 MG/2ML IJ SOLN
INTRAMUSCULAR | Status: AC
  Filled 2022-09-14: qty 2

## 2022-09-14 MED ORDER — IODIXANOL 320 MG/ML IV SOLN
INTRAVENOUS | Status: DC | PRN
  Administered 2022-09-14: 110 mL via INTRA_ARTERIAL

## 2022-09-14 MED ORDER — SODIUM CHLORIDE 0.9 % IV SOLN: 250.0000 mL | INTRAVENOUS | Status: DC | PRN

## 2022-09-14 MED ORDER — CLOPIDOGREL BISULFATE 300 MG PO TABS
ORAL_TABLET | ORAL | Status: DC | PRN
  Administered 2022-09-14: 300 mg via ORAL

## 2022-09-14 SURGICAL SUPPLY — 25 items
BALLN IN.PACT DCB 5X80 (BALLOONS) ×2
CATH ANGIO 5F PIGTAIL 65CM (CATHETERS) IMPLANT
CATH CROSS OVER TEMPO 5F (CATHETERS) IMPLANT
CATH HAWKONE LS STANDARD TIP (CATHETERS) ×2
CATH HAWKONE LS STD TIP (CATHETERS) IMPLANT
CATH SOFT-VU 4F 65 STRAIGHT (CATHETERS) IMPLANT
CATH SOFT-VU STRAIGHT 4F 65CM (CATHETERS) ×2
CATH STRAIGHT 5FR 65CM (CATHETERS) IMPLANT
DCB IN.PACT 5X80 (BALLOONS) IMPLANT
DEVICE CLOSURE MYNXGRIP 6/7F (Vascular Products) IMPLANT
DEVICE SPIDERFX EMB PROT 6MM (WIRE) IMPLANT
KIT ENCORE 26 ADVANTAGE (KITS) IMPLANT
KIT MICROPUNCTURE NIT STIFF (SHEATH) IMPLANT
KIT PV (KITS) ×2 IMPLANT
SHEATH PINNACLE 5F 10CM (SHEATH) IMPLANT
SHEATH PINNACLE 7F 10CM (SHEATH) IMPLANT
SHEATH PINNACLE MP 7F 45CM (SHEATH) IMPLANT
SHEATH PROBE COVER 6X72 (BAG) IMPLANT
STOPCOCK MORSE 400PSI 3WAY (MISCELLANEOUS) IMPLANT
SYR MEDRAD MARK 7 150ML (SYRINGE) ×2 IMPLANT
TAPE SHOOT N SEE (TAPE) IMPLANT
TRANSDUCER W/STOPCOCK (MISCELLANEOUS) ×2 IMPLANT
TRAY PV CATH (CUSTOM PROCEDURE TRAY) ×2 IMPLANT
TUBING CIL FLEX 10 FLL-RA (TUBING) IMPLANT
WIRE HITORQ VERSACORE ST 145CM (WIRE) IMPLANT

## 2022-09-14 NOTE — Telephone Encounter (Signed)
Orders placed for post PV procedure f/u testing. Msg fwd to scheduling to call the pt to schedule.

## 2022-09-14 NOTE — Telephone Encounter (Signed)
-----   Message from Wellington Hampshire, MD sent at 09/14/2022 12:54 PM EDT ----- Status post left SFA atherectomy.  Please schedule an ABI and LEA within 2 weeks.

## 2022-09-15 ENCOUNTER — Encounter (HOSPITAL_COMMUNITY): Payer: Self-pay | Admitting: Cardiovascular Disease

## 2022-09-15 NOTE — Interval H&P Note (Signed)
History and Physical Interval Note:  09/14/2022 10:01 AM  Jasmine Davidson  has presented today for surgery, with the diagnosis of pad.  The various methods of treatment have been discussed with the patient and family. After consideration of risks, benefits and other options for treatment, the patient has consented to  Procedure(s) with comments: ABDOMINAL AORTOGRAM W/LOWER EXTREMITY (N/A) PERIPHERAL VASCULAR ATHERECTOMY (Left) - SFA PERIPHERAL VASCULAR BALLOON ANGIOPLASTY (Left) - SFA as a surgical intervention.  The patient's history has been reviewed, patient examined, no change in status, stable for surgery.  I have reviewed the patient's chart and labs.  Questions were answered to the patient's satisfaction.     Kathlyn Sacramento

## 2022-09-29 NOTE — Telephone Encounter (Signed)
These studies have not been completed and are due.   Please assist with scheduling ASAP.

## 2022-10-05 NOTE — Telephone Encounter (Signed)
Reviewed the patient's chart. Her lower extremity arterial duplex is scheduled for 10/18/22.

## 2022-10-11 ENCOUNTER — Ambulatory Visit: Payer: Medicare HMO | Admitting: Physician Assistant

## 2022-10-18 ENCOUNTER — Ambulatory Visit: Payer: Medicare HMO | Attending: Cardiovascular Disease

## 2022-10-18 DIAGNOSIS — I739 Peripheral vascular disease, unspecified: Secondary | ICD-10-CM

## 2022-10-21 ENCOUNTER — Ambulatory Visit: Payer: Medicare HMO | Attending: Physician Assistant | Admitting: Nurse Practitioner

## 2022-10-21 ENCOUNTER — Encounter: Payer: Self-pay | Admitting: Nurse Practitioner

## 2022-10-21 VITALS — BP 130/64 | HR 72 | Ht 66.0 in | Wt 203.2 lb

## 2022-10-21 DIAGNOSIS — E785 Hyperlipidemia, unspecified: Secondary | ICD-10-CM | POA: Diagnosis not present

## 2022-10-21 DIAGNOSIS — I739 Peripheral vascular disease, unspecified: Secondary | ICD-10-CM | POA: Diagnosis not present

## 2022-10-21 DIAGNOSIS — I10 Essential (primary) hypertension: Secondary | ICD-10-CM

## 2022-10-21 DIAGNOSIS — I2510 Atherosclerotic heart disease of native coronary artery without angina pectoris: Secondary | ICD-10-CM

## 2022-10-21 DIAGNOSIS — N1831 Chronic kidney disease, stage 3a: Secondary | ICD-10-CM

## 2022-10-21 NOTE — Progress Notes (Signed)
Office Visit    Patient Name: Jasmine Davidson Date of Encounter: 10/21/2022  Primary Care Provider:  Theotis Burrow, MD Primary Cardiologist:  Ida Rogue, MD/PV: Jerilynn Mages. Fletcher Anon, MD   Chief Complaint    66 year old female with a history of nonobstructive CAD, hypertension, hyperlipidemia, diabetes, vasovagal syncope, PSVT, nonsustained VT, diabetic retinopathy and neuropathy and stage III chronic kidney disease, and peripheral arterial disease, who presents for follow-up after recent left lower extremity PTA.  Past Medical History    Past Medical History:  Diagnosis Date   CKD (chronic kidney disease) stage 3, GFR 30-59 ml/min (HCC)    Diabetes mellitus    Diastolic dysfunction    a. 07/2020 Echo: EF 65-70%, no rwma, GrI DD, nl RV fxn, mod dil LA.   HTN (hypertension)    Hyperlipidemia    Non-obstructive CAD (coronary artery disease)    a. 07/2020 Cath: LM nl, LAD 32m, RI small-mild diff dzs, LCX nl, RCA min irregs.   Obesity    PAD (peripheral artery disease) ([place])    a. 09/2022 LE Angio/PTA: Heavily Ca2+ Abd Ao and iliacs. LCIA 30, LSFA 70p (atherectomy/DBA), L Pop 100 above-knee w/ reconstitution via collats above knee joint and 3V runoff; b. 10/2022 ABI/Duplex: R 1.11, L 0.64. Patent L SFA site. Chronically occluded prox L pop.   Past Surgical History:  Procedure Laterality Date   ABDOMINAL AORTOGRAM W/LOWER EXTREMITY N/A 09/14/2022   Procedure: ABDOMINAL AORTOGRAM W/LOWER EXTREMITY;  Surgeon: Wellington Hampshire, MD;  Location: [place] [facility];  Service: Cardiovascular;  Laterality: N/A;   BLADDER REPAIR W/ CESAREAN SECTION  1981   BREAST BIOPSY Right 12/17/2014   negative stereotactic   CARDIAC CATHETERIZATION     COLONOSCOPY WITH PROPOFOL N/A 03/30/2021   Procedure: COLONOSCOPY WITH PROPOFOL;  Surgeon: Lesly Rubenstein, MD;  Location: [facility];  Service: Endoscopy;  Laterality: N/A;  DM   cyst taken off finger  2011   ESOPHAGOGASTRODUODENOSCOPY (EGD)  WITH PROPOFOL N/A 03/30/2021   Procedure: ESOPHAGOGASTRODUODENOSCOPY (EGD) WITH PROPOFOL;  Surgeon: Lesly Rubenstein, MD;  Location: [facility];  Service: Endoscopy;  Laterality: N/A;   LEFT HEART CATH AND CORONARY ANGIOGRAPHY N/A 07/21/2020   Procedure: LEFT HEART CATH AND CORONARY ANGIOGRAPHY;  Surgeon: Nelva Bush, MD;  Location: [place] [facility];  Service: Cardiovascular;  Laterality: N/A;   PERIPHERAL VASCULAR ATHERECTOMY Left 09/14/2022   Procedure: PERIPHERAL VASCULAR ATHERECTOMY;  Surgeon: Wellington Hampshire, MD;  Location: [place] [facility];  Service: Cardiovascular;  Laterality: Left;  SFA   PERIPHERAL VASCULAR BALLOON ANGIOPLASTY Left 09/14/2022   Procedure: PERIPHERAL VASCULAR BALLOON ANGIOPLASTY;  Surgeon: Wellington Hampshire, MD;  Location: [place] [facility];  Service: Cardiovascular;  Laterality: Left;  SFA   TOE SURGERY  02/08/2013    Allergies  Allergies  Allergen Reactions   Pregabalin Other (See Comments)    Could not function    History of Present Illness    66 year old female with above past medical history including nonobstructive CAD, hypertension, hyperlipidemia, diabetes, stage III chronic kidney disease, vasovagal syncope, PSVT, nonsustained VT, diabetic retinopathy and neuropathy, and peripheral arterial disease.  She was previously followed by vascular surgery at [facility] for chronic venous insufficiency and is status post GSV stripping and left GSV venous ablation.  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  She underwent diagnostic catheterization in August 2021, in the setting of syncope, chest pain, and  mild troponin elevation.  This showed mild, nonobstructive mid LAD disease, and she was medically managed.  Echo at that time showed an EF of 65 to 70% with grade 1 diastolic dysfunction.    In September 2022, she was evaluated for left calf claudication with an ABI of 0.52 on the left.   Vascular ultrasound suggested significant disease versus short occlusion in the distal SFA/proximal popliteal artery.  Repeat ABI in September of this year, was stable at 0.59 on the left and normal on the right.  In the setting of progressive claudication, she underwent lower extremity arterial angiography in October, revealing heavily calcified abdominal aortic and iliac disease with 70% stenosis in the left SFA and an occluded left popliteal with reconstitution via collaterals above the knee joint and three-vessel runoff.  The left SFA was successfully treated with drug-coated balloon angioplasty.  Follow-up ABIs earlier this month were relatively unchanged with some improvement in left TBI while duplex showed patency of the left SFA angioplasty site.  Since her PTA, she has done exceptionally well.  She has noted significantly improved activity tolerance without claudication.  She denies chest pain, dyspnea, palpitations, PND, orthopnea, dizziness, syncope, edema, or early satiety.  She feels that her right groin is healed up well.  Home Medications    Current Outpatient Medications  Medication Sig Dispense Refill   aspirin (ASPIRIN 81) 81 MG EC tablet Take 81 mg by mouth daily.     atorvastatin (LIPITOR) 40 MG tablet TAKE ONE TABLET BY MOUTH EVERY DAY 30 tablet 11   butalbital-acetaminophen-caffeine (FIORICET) 50-325-40 MG tablet Take 2 tablets by mouth every 6 (six) hours as needed for headache or migraine. 30 tablet 0   carvedilol (COREG) 12.5 MG tablet TAKE 1 TABLET BY MOUTH 2 TIMES A DAY WITH A MEAL 180 tablet 1   cetirizine (ZYRTEC) 10 MG tablet Take 10 mg by mouth daily.     Cholecalciferol (EQL VITAMIN D3) 25 MCG (1000 UT) capsule Take 1,000 Units by mouth daily.     clopidogrel (PLAVIX) 75 MG tablet Take 1 tablet (75 mg total) by mouth daily. 30 tablet 6   empagliflozin (JARDIANCE) 10 MG TABS tablet Take 1 tablet (10 mg total) by mouth daily. 30 tablet 11   Ferrous Sulfate (IRON) 325 (65  Fe) MG TABS Take 325 mg by mouth daily.     fluticasone (FLONASE) 50 MCG/ACT nasal spray Place 1 spray into both nostrils daily as needed for rhinitis.     furosemide (LASIX) 20 MG tablet Take 1 tablet (20 mg total) by mouth daily as needed. (Patient taking differently: Take 20 mg by mouth daily as needed for fluid or edema.) 30 tablet 11   gabapentin (NEURONTIN) 300 MG capsule Take 300 mg by mouth 2 (two) times daily.      Lifitegrast (XIIDRA) 5 % SOLN Place 1 drop into both eyes 2 (two) times daily.     liraglutide (VICTOZA) 18 MG/3ML SOPN Inject 1.2 mg into the skin daily.     lisinopril (ZESTRIL) 10 MG tablet TAKE 1 TABLET BY MOUTH ONCE DAILY. 90 tablet 1   metFORMIN (GLUCOPHAGE) 1000 MG tablet Take 1,000 mg by mouth 2 (two) times daily.     venlafaxine (EFFEXOR) 37.5 MG tablet Take 37.5 mg by mouth daily.     vitamin B-12 (CYANOCOBALAMIN) 1000 MCG tablet Take 1,000 mcg by mouth daily.     No current facility-administered medications for this visit.     Review of Systems  She denies claudication, chest pain, palpitations, dyspnea, pnd, orthopnea, n, v, dizziness, syncope, edema, weight gain, or early satiety.  All other systems reviewed and are otherwise negative except as noted above.    Physical Exam    VS:  BP 130/64 (BP Location: Left Arm, Patient Position: Sitting, Cuff Size: Large)   Pulse 72   Ht 5\' 6"  (1.676 m)   Wt 203 lb 3.2 oz (92.2 kg)   LMP 09/12/1999   SpO2 99%   BMI 32.80 kg/m  , BMI Body mass index is 32.8 kg/m.     GEN: Well nourished, well developed, in no acute distress. HEENT: normal. Neck: Supple, no JVD or masses.  Left carotid bruit. Cardiac: RRR, no murmurs, rubs, or gallops. No clubbing, cyanosis, edema.  Radials/PT 2+ on the right.  Unable to palpate on the left.  Right groin site without bleeding, bruit, or hematoma. Respiratory:  Respirations regular and unlabored, clear to auscultation bilaterally. GI: Soft, nontender, nondistended, BS + x  4. MS: no deformity or atrophy. Skin: warm and dry, no rash. Neuro:  Strength and sensation are intact. Psych: Normal affect.  Accessory Clinical Findings    Lab Results  Component Value Date   WBC 4.2 09/13/2022   HGB 12.3 09/13/2022   HCT 38.2 09/13/2022   MCV 97.2 09/13/2022   PLT 112 (L) 09/13/2022   Lab Results  Component Value Date   CREATININE 1.17 (H) 09/13/2022   BUN 27 (H) 09/13/2022   NA 137 09/13/2022   K 4.3 09/13/2022   CL 105 09/13/2022   CO2 25 09/13/2022   Lab Results  Component Value Date   ALT 13 07/18/2020   AST 15 07/18/2020   ALKPHOS 72 07/18/2020   BILITOT 1.0 07/18/2020   Lab Results  Component Value Date   CHOL 108 07/20/2020   HDL 28 (L) 07/20/2020   LDLCALC 62 07/20/2020   TRIG 88 07/20/2020   CHOLHDL 3.9 07/20/2020    Lab Results  Component Value Date   HGBA1C 5.8 (H) 07/18/2020    Assessment & Plan    1.  Peripheral arterial disease/left lower extremity complication: Status post recent atherectomy and drug-coated balloon angioplasty of the left SFA with note of chronic occlusion of the left popliteal with good collaterals and three-vessel lower leg runoff.  Since then, she has had resolution of claudication and significant improvement in activity tolerance.  Recent ABI/TBI's were overall stable with slight improvement in TBI on the left.  Groin site has healed well.  She will remain on dual antiplatelet therapy for a total of 2 months and then subsequently remain on aspirin only.  Continue statin therapy.  2.  Nonobstructive CAD: Status post non-STEMI in August 2021 with catheterization at that time showing a 45% mid LAD stenosis and otherwise minor irregularities.  She has not been having any chest pain or dyspnea.  She remains on aspirin, statin, beta-blocker, and ACE inhibitor therapy.  3.  Essential hypertension: Stable on beta-blocker and ACE inhibitor therapy.  4.  Hyperlipidemia: LDL was 62 in August 2021.  Do not see any repeat  lipids since then.  She is not fasting today.  Perhaps we can arrange for this at follow-up if not performed by primary care first.  5.  Type 2 diabetes mellitus: A1c 6.0 in February.  She is managed by primary care and is on metformin, Victoza and Jardiance.  6.  Carotid arterial disease/left carotid bruit: 1 to 39% bilateral internal carotid artery stenoses by  ultrasound in October.  She remains on aspirin and statin therapy.  No plan for follow-up imaging.  7.  Stage III chronic kidney disease: Stable lab work in October.  8.  Disposition: Follow-up ABI/duplex in 6 months with PV follow-up shortly thereafter.   Murray Hodgkins, NP 10/21/2022, 11:57 AM

## 2022-10-21 NOTE — Patient Instructions (Signed)
Medication Instructions:   Your physician recommends that you continue on your current medications as directed. Please refer to the Current Medication list given to you today.  *If you need a refill on your cardiac medications before your next appointment, please call your pharmacy*   Lab Work:  None Ordered  If you have labs (blood work) drawn today and your tests are completely normal, you will receive your results only by: [place] (if you have MyChart) OR A paper copy in the mail If you have any lab test that is abnormal or we need to change your treatment, we will call you to review the results.   Testing/Procedures:  Your physician has requested that you have a lower extremity arterial  duplex. During this test, ultrasound are used to evaluate arterial blood flow in the legs. Allow one hour for this exam. There are no restrictions or special instructions.  Your physician has requested that you have an ankle brachial index (ABI). During this test an ultrasound and blood pressure cuff are used to evaluate the arteries that supply the arms and legs with blood. Allow thirty minutes for this exam. There are no restrictions or special instructions.    Follow-Up: At [facility], you and your health needs are our priority.  As part of our continuing mission to provide you with exceptional heart care, we have created designated Provider Care Teams.  These Care Teams include your primary Cardiologist (physician) and Advanced Practice Providers (APPs -  Physician Assistants and Nurse Practitioners) who all work together to provide you with the care you need, when you need it.  We recommend signing up for the patient portal called "MyChart".  Sign up information is provided on this After Visit Summary.  MyChart is used to connect with patients for Virtual Visits (Telemedicine).  Patients are able to view lab/test results, encounter notes, upcoming appointments, etc.  Non-urgent  messages can be sent to your provider as well.   To learn more about what you can do with MyChart, go to [email or web address].    Your next appointment:   6 month(s)  The format for your next appointment:   In Person  Provider:   Kathlyn Sacramento, MD

## 2022-11-29 ENCOUNTER — Other Ambulatory Visit: Payer: Self-pay | Admitting: Family Medicine

## 2022-11-29 DIAGNOSIS — Z1231 Encounter for screening mammogram for malignant neoplasm of breast: Secondary | ICD-10-CM

## 2022-12-09 ENCOUNTER — Ambulatory Visit
Admission: RE | Admit: 2022-12-09 | Discharge: 2022-12-09 | Disposition: A | Discharge status: Home / Self Care | Payer: Medicare HMO | Source: Ambulatory Visit | Attending: Family Medicine | Admitting: Family Medicine

## 2022-12-09 DIAGNOSIS — Z1231 Encounter for screening mammogram for malignant neoplasm of breast: Secondary | ICD-10-CM | POA: Insufficient documentation

## 2022-12-16 ENCOUNTER — Other Ambulatory Visit: Payer: Self-pay

## 2022-12-16 DIAGNOSIS — K8020 Calculus of gallbladder without cholecystitis without obstruction: Secondary | ICD-10-CM | POA: Diagnosis not present

## 2022-12-16 DIAGNOSIS — R112 Nausea with vomiting, unspecified: Secondary | ICD-10-CM | POA: Insufficient documentation

## 2022-12-16 DIAGNOSIS — R1084 Generalized abdominal pain: Secondary | ICD-10-CM | POA: Insufficient documentation

## 2022-12-16 DIAGNOSIS — N189 Chronic kidney disease, unspecified: Secondary | ICD-10-CM | POA: Diagnosis not present

## 2022-12-16 DIAGNOSIS — I5030 Unspecified diastolic (congestive) heart failure: Secondary | ICD-10-CM | POA: Diagnosis not present

## 2022-12-16 DIAGNOSIS — Z7901 Long term (current) use of anticoagulants: Secondary | ICD-10-CM | POA: Insufficient documentation

## 2022-12-16 DIAGNOSIS — I130 Hypertensive heart and chronic kidney disease with heart failure and stage 1 through stage 4 chronic kidney disease, or unspecified chronic kidney disease: Secondary | ICD-10-CM | POA: Insufficient documentation

## 2022-12-16 DIAGNOSIS — E1122 Type 2 diabetes mellitus with diabetic chronic kidney disease: Secondary | ICD-10-CM | POA: Diagnosis not present

## 2022-12-16 DIAGNOSIS — Z7984 Long term (current) use of oral hypoglycemic drugs: Secondary | ICD-10-CM | POA: Diagnosis not present

## 2022-12-16 DIAGNOSIS — Z79899 Other long term (current) drug therapy: Secondary | ICD-10-CM | POA: Insufficient documentation

## 2022-12-16 DIAGNOSIS — Z7982 Long term (current) use of aspirin: Secondary | ICD-10-CM | POA: Diagnosis not present

## 2022-12-16 DIAGNOSIS — Z8679 Personal history of other diseases of the circulatory system: Secondary | ICD-10-CM | POA: Diagnosis not present

## 2022-12-16 LAB — CBC
HCT: 44.1 % (ref 36.0–46.0)
Hemoglobin: 14.1 g/dL (ref 12.0–15.0)
MCH: 31.3 pg (ref 26.0–34.0)
MCHC: 32 g/dL (ref 30.0–36.0)
MCV: 97.8 fL (ref 80.0–100.0)
Platelets: 142 10*3/uL — ABNORMAL LOW (ref 150–400)
RBC: 4.51 MIL/uL (ref 3.87–5.11)
RDW: 12.6 % (ref 11.5–15.5)
WBC: 5.3 10*3/uL (ref 4.0–10.5)
nRBC: 0 % (ref 0.0–0.2)

## 2022-12-16 LAB — COMPREHENSIVE METABOLIC PANEL
ALT: 15 U/L (ref 0–44)
AST: 19 U/L (ref 15–41)
Albumin: 3.8 g/dL (ref 3.5–5.0)
Alkaline Phosphatase: 66 U/L (ref 38–126)
Anion gap: 11 (ref 5–15)
BUN: 22 mg/dL (ref 8–23)
CO2: 23 mmol/L (ref 22–32)
Calcium: 9 mg/dL (ref 8.9–10.3)
Chloride: 105 mmol/L (ref 98–111)
Creatinine, Ser: 1.09 mg/dL — ABNORMAL HIGH (ref 0.44–1.00)
GFR, Estimated: 56 mL/min — ABNORMAL LOW (ref >60)
Glucose, Bld: 130 mg/dL — ABNORMAL HIGH (ref 70–99)
Potassium: 4.3 mmol/L (ref 3.5–5.1)
Sodium: 139 mmol/L (ref 135–145)
Total Bilirubin: 1.6 mg/dL — ABNORMAL HIGH (ref 0.3–1.2)
Total Protein: 6.6 g/dL (ref 6.5–8.1)

## 2022-12-16 LAB — LIPASE, BLOOD: Lipase: 37 U/L (ref 11–51)

## 2022-12-16 NOTE — ED Triage Notes (Signed)
Pt comes from home via POV c/o lower abd pain and vomiting. Pt reports this started Wednesday and has not been able to keep anything down since/ pt denies fevers, CP, SOB.

## 2022-12-17 ENCOUNTER — Emergency Department
Admission: EM | Admit: 2022-12-17 | Discharge: 2022-12-17 | Disposition: A | Discharge status: Home / Self Care | Payer: Medicare HMO | Attending: Emergency Medicine | Admitting: Emergency Medicine

## 2022-12-17 ENCOUNTER — Emergency Department: Payer: Medicare HMO

## 2022-12-17 DIAGNOSIS — K8020 Calculus of gallbladder without cholecystitis without obstruction: Secondary | ICD-10-CM

## 2022-12-17 DIAGNOSIS — R1084 Generalized abdominal pain: Secondary | ICD-10-CM

## 2022-12-17 DIAGNOSIS — R112 Nausea with vomiting, unspecified: Secondary | ICD-10-CM

## 2022-12-17 LAB — URINALYSIS, ROUTINE W REFLEX MICROSCOPIC
Bilirubin Urine: NEGATIVE
Glucose, UA: >=500 mg/dL — AB
Hgb urine dipstick: NEGATIVE
Ketones, ur: 20 mg/dL — AB
Leukocytes,Ua: NEGATIVE
Nitrite: NEGATIVE
Protein, ur: NEGATIVE mg/dL
Specific Gravity, Urine: 1.04 — ABNORMAL HIGH (ref 1.005–1.030)
pH: 5 (ref 5.0–8.0)

## 2022-12-17 MED ORDER — FENTANYL CITRATE PF 50 MCG/ML IJ SOSY
50.0000 ug | PREFILLED_SYRINGE | Freq: Once | INTRAMUSCULAR | Status: AC
  Administered 2022-12-17: 50 ug via INTRAVENOUS
  Filled 2022-12-17: qty 1

## 2022-12-17 MED ORDER — ONDANSETRON HCL 4 MG/2ML IJ SOLN
4.0000 mg | Freq: Once | INTRAMUSCULAR | Status: AC
  Administered 2022-12-17: 4 mg via INTRAVENOUS
  Filled 2022-12-17: qty 2

## 2022-12-17 MED ORDER — SODIUM CHLORIDE 0.9 % IV BOLUS (SEPSIS)
1000.0000 mL | Freq: Once | INTRAVENOUS | Status: AC
  Administered 2022-12-17: 1000 mL via INTRAVENOUS

## 2022-12-17 MED ORDER — ONDANSETRON 4 MG PO TBDP: 4.0000 mg | ORAL_TABLET | Freq: Four times a day (QID) | ORAL | 0 refills | Status: DC | PRN

## 2022-12-17 MED ORDER — IOHEXOL 350 MG/ML SOLN
100.0000 mL | Freq: Once | INTRAVENOUS | Status: AC | PRN
  Administered 2022-12-17: 100 mL via INTRAVENOUS

## 2022-12-17 NOTE — ED Notes (Signed)
Unable to provide urine sample at this time

## 2022-12-17 NOTE — Discharge Instructions (Addendum)
Your lab work, urine and CT scan were reassuring.  You do have gallstones on your CT scan but I do not think this is the cause of your symptoms today.  Suspect that you have a viral illness.  I recommend a bland diet for the next several days.  May alternate over-the-counter Tylenol and ibuprofen as needed for fever and pain.  You may use over-the-counter Imodium as needed for diarrhea.

## 2022-12-17 NOTE — ED Provider Notes (Signed)
[facility] Provider Note    Event Date/Time   First MD Initiated Contact with Patient 12/17/22 [phone]     (approximate)   History   Abdominal Pain and Emesis   HPI  Jasmine Davidson is a 66 y.o. female with history of hypertension, diabetes, chronic kidney disease, CAD, PAD, diastolic CHF who presents to the emergency department complaints of several days of nausea, vomiting and diarrhea and generalized abdominal pain.  No previous abdominal surgery other than C-section.   History provided by patient.    Past Medical History:  Diagnosis Date   CKD (chronic kidney disease) stage 3, GFR 30-59 ml/min (HCC)    Diabetes mellitus    Diastolic dysfunction    a. 07/2020 Echo: EF 65-70%, no rwma, GrI DD, nl RV fxn, mod dil LA.   HTN (hypertension)    Hyperlipidemia    Non-obstructive CAD (coronary artery disease)    a. 07/2020 Cath: LM nl, LAD 82m, RI small-mild diff dzs, LCX nl, RCA min irregs.   Obesity    PAD (peripheral artery disease) ([place])    a. 09/2022 LE Angio/PTA: Heavily Ca2+ Abd Ao and iliacs. LCIA 30, LSFA 70p (atherectomy/DBA), L Pop 100 above-knee w/ reconstitution via collats above knee joint and 3V runoff; b. 10/2022 ABI/Duplex: R 1.11, L 0.64. Patent L SFA site. Chronically occluded prox L pop.    Past Surgical History:  Procedure Laterality Date   ABDOMINAL AORTOGRAM W/LOWER EXTREMITY N/A 09/14/2022   Procedure: ABDOMINAL AORTOGRAM W/LOWER EXTREMITY;  Surgeon: Wellington Hampshire, MD;  Location: [place] [facility];  Service: Cardiovascular;  Laterality: N/A;   BLADDER REPAIR W/ CESAREAN SECTION  1981   BREAST BIOPSY Right 12/17/2014   negative stereotactic   CARDIAC CATHETERIZATION     COLONOSCOPY WITH PROPOFOL N/A 03/30/2021   Procedure: COLONOSCOPY WITH PROPOFOL;  Surgeon: Lesly Rubenstein, MD;  Location: [facility] ENDOS[facility];  Service: Endoscopy;  Laterality: N/A;  DM   cyst taken off finger  2011   ESOPHAGOGASTRODUODENOSCOPY (EGD) WITH  PROPOFOL N/A 03/30/2021   Procedure: ESOPHAGOGASTRODUODENOSCOPY (EGD) WITH PROPOFOL;  Surgeon: Lesly Rubenstein, MD;  Location: [facility] ENDOSCOPY;  Service: Endoscopy;  Laterality: N/A;   LEFT HEART CATH AND CORONARY ANGIOGRAPHY N/A 07/21/2020   Procedure: LEFT HEART CATH AND CORONARY ANGIOGRAPHY;  Surgeon: Nelva Bush, MD;  Location: [place] [facility];  Service: Cardiovascular;  Laterality: N/A;   PERIPHERAL VASCULAR ATHERECTOMY Left 09/14/2022   Procedure: PERIPHERAL VASCULAR ATHERECTOMY;  Surgeon: Wellington Hampshire, MD;  Location: [place] [facility];  Service: Cardiovascular;  Laterality: Left;  SFA   PERIPHERAL VASCULAR BALLOON ANGIOPLASTY Left 09/14/2022   Procedure: PERIPHERAL VASCULAR BALLOON ANGIOPLASTY;  Surgeon: Wellington Hampshire, MD;  Location: [place] [facility];  Service: Cardiovascular;  Laterality: Left;  SFA   TOE SURGERY  02/08/2013    MEDICATIONS:  Prior to Admission medications   Medication Sig Start Date End Date Taking? Authorizing Provider  aspirin (ASPIRIN 81) 81 MG EC tablet Take 81 mg by mouth daily.    [provider]  atorvastatin (LIPITOR) 40 MG tablet TAKE ONE TABLET BY MOUTH EVERY DAY 06/29/21   Minna Merritts, MD  butalbital-acetaminophen-caffeine (FIORICET) 50-325-40 MG tablet Take 2 tablets by mouth every 6 (six) hours as needed for headache or migraine. 07/28/22 07/28/23  Vladimir Crofts, MD  carvedilol (COREG) 12.5 MG tablet TAKE 1 TABLET BY MOUTH 2 TIMES A DAY WITH A MEAL 04/20/22   Gollan, Kathlene November, MD  cetirizine (ZYRTEC)  10 MG tablet Take 10 mg by mouth daily. 06/15/21   [provider]  Cholecalciferol (EQL VITAMIN D3) 25 MCG (1000 UT) capsule Take 1,000 Units by mouth daily.    [provider]  clopidogrel (PLAVIX) 75 MG tablet Take 1 tablet (75 mg total) by mouth daily. 09/14/22 09/14/23  Wellington Hampshire, MD  empagliflozin (JARDIANCE) 10 MG TABS tablet Take 1 tablet (10 mg total) by mouth daily. 06/29/21   Minna Merritts, MD   Ferrous Sulfate (IRON) 325 (65 Fe) MG TABS Take 325 mg by mouth daily.    [provider]  fluticasone (FLONASE) 50 MCG/ACT nasal spray Place 1 spray into both nostrils daily as needed for rhinitis. 06/15/21   [provider]  furosemide (LASIX) 20 MG tablet Take 1 tablet (20 mg total) by mouth daily as needed. Patient taking differently: Take 20 mg by mouth daily as needed for fluid or edema. 06/29/21   Minna Merritts, MD  gabapentin (NEURONTIN) 300 MG capsule Take 300 mg by mouth 2 (two) times daily.     [provider]  Lifitegrast Shirley Friar) 5 % SOLN Place 1 drop into both eyes 2 (two) times daily.    [provider]  liraglutide (VICTOZA) 18 MG/3ML SOPN Inject 1.2 mg into the skin daily.    [provider]  lisinopril (ZESTRIL) 10 MG tablet TAKE 1 TABLET BY MOUTH ONCE DAILY. 04/21/22   Minna Merritts, MD  metFORMIN (GLUCOPHAGE) 1000 MG tablet Take 1,000 mg by mouth 2 (two) times daily. 09/06/22   [provider]  venlafaxine (EFFEXOR) 37.5 MG tablet Take 37.5 mg by mouth daily. 09/07/22   [provider]  vitamin B-12 (CYANOCOBALAMIN) 1000 MCG tablet Take 1,000 mcg by mouth daily.    [provider]    Physical Exam   Triage Vital Signs: ED Triage Vitals  Enc Vitals Group     BP 12/16/22 2152 (!) 167/71     Pulse Rate 12/16/22 2152 69     Resp 12/16/22 2152 18     Temp 12/16/22 2152 97.9 F (36.6 C)     Temp Source 12/16/22 2152 Oral     SpO2 12/16/22 2152 100 %     Weight 12/16/22 2157 201 lb (91.2 kg)     Height 12/16/22 2157 5\' 6"  (1.676 m)     Head Circumference --      Peak Flow --      Pain Score 12/16/22 2157 7     Pain Loc --      Pain Edu? --      Excl. in [place]? --     Most recent vital signs: Vitals:   12/17/22 0242 12/17/22 0452  BP: 132/80 (!) 160/61  Pulse: 68 73  Resp: 18 18  Temp: 97.7 F (36.5 C) (!) 97.5 F (36.4 C)  SpO2: 100% 100%    CONSTITUTIONAL: Alert and oriented and  responds appropriately to questions. Well-appearing; well-nourished HEAD: Normocephalic, atraumatic EYES: Conjunctivae clear, pupils appear equal, sclera nonicteric ENT: normal nose; moist mucous membranes NECK: Supple, normal ROM CARD: RRR; S1 and S2 appreciated; no murmurs, no clicks, no rubs, no gallops RESP: Normal chest excursion without splinting or tachypnea; breath sounds clear and equal bilaterally; no wheezes, no rhonchi, no rales, no hypoxia or respiratory distress, speaking full sentences ABD/GI: Normal bowel sounds; non-distended; soft, diffusely tender to palpation, no rebound, no guarding, no peritoneal signs BACK: The back appears normal EXT: Normal ROM in all joints;  no deformity noted, no edema; no cyanosis SKIN: Normal color for age and race; warm; no rash on exposed skin NEURO: Moves all extremities equally, normal speech PSYCH: The patient's mood and manner are appropriate.   ED Results / Procedures / Treatments   LABS: (all labs ordered are listed, but only abnormal results are displayed) Labs Reviewed  COMPREHENSIVE METABOLIC PANEL - Abnormal; Notable for the following components:      Result Value   Glucose, Bld 130 (*)    Creatinine, Ser 1.09 (*)    Total Bilirubin 1.6 (*)    GFR, Estimated 56 (*)    All other components within normal limits  CBC - Abnormal; Notable for the following components:   Platelets 142 (*)    All other components within normal limits  URINALYSIS, ROUTINE W REFLEX MICROSCOPIC - Abnormal; Notable for the following components:   Color, Urine YELLOW (*)    APPearance CLEAR (*)    Specific Gravity, Urine 1.040 (*)    Glucose, UA >=500 (*)    Ketones, ur 20 (*)    Bacteria, UA RARE (*)    All other components within normal limits  LIPASE, BLOOD     EKG:   RADIOLOGY: My personal review and interpretation of imaging: CT unremarkable.  I have personally reviewed all radiology reports.   CT ABDOMEN PELVIS W CONTRAST  Result  Date: 12/17/2022 CLINICAL DATA:  Lower abdominal pain with vomiting. EXAM: CT ABDOMEN AND PELVIS WITH CONTRAST TECHNIQUE: Multidetector CT imaging of the abdomen and pelvis was performed using the standard protocol following bolus administration of intravenous contrast. RADIATION DOSE REDUCTION: This exam was performed according to the departmental dose-optimization program which includes automated exposure control, adjustment of the mA and/or kV according to patient size and/or use of iterative reconstruction technique. CONTRAST:  148mL OMNIPAQUE IOHEXOL 350 MG/ML SOLN COMPARISON:  None similar FINDINGS: Lower chest: Coronary atherosclerosis. Aortic annular calcification. Hepatobiliary: No focal liver abnormality.Layering gallstones. Full gallbladder but no visible inflammation. No bile duct dilatation. Pancreas: No acute finding. Generalized atrophy with fatty infiltration. Spleen: Unremarkable. Adrenals/Urinary Tract: Negative adrenals. No hydronephrosis or stone. Renal cortical thinning asymmetric to the left kidney, possibly from chronic renal artery atherosclerosis given the extent of atheromatous plaque. Unremarkable bladder. Stomach/Bowel: No obstruction. No visible bowel inflammation. Few colonic diverticula Vascular/Lymphatic: No acute vascular abnormality. Extensive atheromatous plaque of the aorta and branch vessels. No mass or adenopathy. Reproductive:No pathologic findings. Other: No ascites or pneumoperitoneum. Musculoskeletal: No acute abnormalities. Spondylosis and degeneration throughout the spine. Bridging lower thoracic osteophytes. Subjective osteopenia. IMPRESSION: 1. No acute finding.  No bowel obstruction or visible inflammation. 2. Cholelithiasis. 3. Extensive atherosclerosis. Electronically Signed   By: Jorje Guild M.D.   On: 12/17/2022 05:40     PROCEDURES:  Critical Care performed: No     Procedures    IMPRESSION / MDM / ASSESSMENT AND PLAN / ED COURSE  I reviewed the  triage vital signs and the nursing notes.    Patient here with generalized abdominal pain, vomiting and diarrhea.     DIFFERENTIAL DIAGNOSIS (includes but not limited to):   Viral gastroenteritis, food poisoning, colitis, diverticulitis, appendicitis, less likely bowel obstruction, perforation   Patient's presentation is most consistent with acute presentation with potential threat to life or bodily function.   PLAN: Labs obtained from triage.  No leukocytosis, normal hemoglobin.  Creatinine of 1.09 improved compared to previous.  Normal electrolytes.  Total bili minimally elevated at 1.6 but otherwise normal LFTs and lipase.  Will obtain CT of the abdomen pelvis.  Will give IV fluids, pain and nausea medicine.   MEDICATIONS GIVEN IN ED: Medications  sodium chloride 0.9 % bolus 1,000 mL (0 mLs Intravenous Stopped 12/17/22 0510)  ondansetron (ZOFRAN) injection 4 mg (4 mg Intravenous Given 12/17/22 0439)  fentaNYL (SUBLIMAZE) injection 50 mcg (50 mcg Intravenous Given 12/17/22 0439)  iohexol (OMNIPAQUE) 350 MG/ML injection 100 mL (100 mLs Intravenous Contrast Given 12/17/22 0458)     ED COURSE: CT scan reviewed and interpreted by myself and the radiologist and shows gallstones without any other complicating factors.  She does not have tenderness specifically over the right upper quadrant and it seems more diffuse.  No other acute abnormality.  Urine shows no sign infection.  Does show small ketones but she did receive IV fluid bolus here.  Tolerating p.o.  Suspect viral gastroenteritis.  Will discharge with Zofran and supportive care instructions.   At this time, I do not feel there is any life-threatening condition present. I reviewed all nursing notes, vitals, pertinent previous records.  All lab and urine results, EKGs, imaging ordered have been independently reviewed and interpreted by myself.  I reviewed all available radiology reports from any imaging ordered this visit.  Based on my  assessment, I feel the patient is safe to be discharged home without further emergent workup and can continue workup as an outpatient as needed. Discussed all findings, treatment plan as well as usual and customary return precautions.  They verbalize understanding and are comfortable with this plan.  Outpatient follow-up has been provided as needed.  All questions have been answered.    CONSULTS:  none   OUTSIDE RECORDS REVIEWED: Reviewed patient's last neurology visit with Luella Cook on 11/28/2022.       FINAL CLINICAL IMPRESSION(S) / ED DIAGNOSES   Final diagnoses:  Generalized abdominal pain  Nausea vomiting and diarrhea  Gallstones     Rx / DC Orders   ED Discharge Orders          Ordered    ondansetron (ZOFRAN-ODT) 4 MG disintegrating tablet  Every 6 hours PRN        12/17/22 0644             Note:  This document was prepared using Dragon voice recognition software and may include unintentional dictation errors.   Jarah Pember, Delice Bison, DO 12/17/22 [phone]

## 2022-12-29 ENCOUNTER — Ambulatory Visit: Payer: Medicare HMO | Admitting: Nurse Practitioner

## 2023-01-01 NOTE — Progress Notes (Signed)
Cardiology Office Note  Date:  01/02/2023   ID:  Jasmine Davidson, DOB [birth year], MRN [id number]  PCP:  Theotis Burrow, MD   Chief Complaint  Patient presents with   12 month follow up     "Doing well." Medications reviewed by the patient verbally.     HPI:  [name] is a 67 year old woman with past medical history of obesity,  syncope though secondary to vasovagal etiology managed with b-blockers,  Cath 2021: Moderate, nonobstructive single-vessel coronary artery disease with 40-50% mid LAD stenosis. diabetes,  HTN,  Hyperlipidemia pSVT, NSVT, CKD stage III, COVID-19 in 11/2019,  IDDM with diabetic retinopathy and neuropathy,  HTN, HLD,  PVD Who presents for follow-up of her coronary disease  Last office visit January 2023 URI jan 2023, stomach pain, nausea Seen in the ER 12/17/22 Cholelithiasis vs viral  Followed by Dr. Fletcher Anon for her PAD PVD study performed September 14, 2022 Successful directional atherectomy and drug-coated balloon angioplasty to the proximal left SFA.  Residual short occlusion proximal popliteal artery Recent ABIs 0.64 on left 1.1 on the right Patent left SFA angioplasty site, chronically occluded proximal left popliteal On asa, off plavix end of dec 2023   Legs "better" since angioplasty of SFA, no claudication   Lab work reviewed A1C 6.4 CR 1.06 No recent lipid panel On Lipitor 40 Prior lipid panel 2021 within reasonable range  No regular exercise program  Echo 2021: normal EF 60%  EKG personally reviewed by myself on todays visit NSR rate 74 bpm LAD, poor R wave progression to the anterior precordial leads, no ST or T wave changes  PMH rviewed Neuropathy in legs  Prior procedures reviewed Cath 07/2020 Moderate, nonobstructive single-vessel coronary artery disease with 40-50% mid LAD stenosis.  Mild luminal irregularities involving ramus intermedius and right coronary artery also noted. Mildly elevated left ventricular  filling pressure (LVEDP 15-20 mmHg).    Carotid u/s 2021 Bilateral carotid atherosclerosis. No hemodynamically significant ICA stenosis. Degree of narrowing less than 50% bilaterally by ultrasound criteria.  extensive records reviewed: cardiac cath at [facility] in 2015 revealed 50% mid LAD stenosis with an FFR of 0.88, that was not hemodynamically significant, and LVEF of 60%.   nonobstructive renal and SFA disease with an LVEDP of 11 mmHg.    COVID-19 in 11/2019. Had vaccine since then   ad[facility] to [facility] on [facility]/18[facility]20 with severe dizziness and presyncope left-sided chest discomfort hypertensive upon arrival.    Orthostatic vitals were positive upon standing.    Echo demonstrated an EF of 65 to 70%, no regional wall motion abnormalities, grade 1 diastolic dysfunction, normal RV systolic function and ventricular cavity size, moderately dilated left atrium, and no significant valvular disease.   LHC on 07/21/2020 which showed moderate 1-vessel CAD with 40 to 50% mid LAD stenosis, minimal luminal irregularities involving the ramus intermedius and RCA   Mildly elevated LVEDP at 15 to 20 mmHg.  Medical therapy was recommended.  acute on CKD, her HCTZ was held. presyncope was represented by orthostasis versus vasovagal episode.    Outpatient cardiac monitoring demonstrated a predominant rhythm of sinus with an average heart rate of 74 bpm (range 57 to 110 bpm in sinus), rare PACs and PVCs, 5 short runs of PSVT were noted with the longest interval lasting 7 beats with a maximum rate of 119 bpm.  Single 5 beat run of NSVT was noted with a maximum rate of 156 bpm, no sustained arrhythmias or prolonged pauses were noted.  Carotid ultrasound and lower extremity arterial Dopplers reviewed with her in detail  History of retinal vein occlusion. numerous laser treatments.   Lab Results  Component Value Date   CHOL 108 07/20/2020   HDL 28 (L) 07/20/2020   LDLCALC 62 07/20/2020   TRIG 88 07/20/2020     PMH:   has a past medical history of CKD (chronic kidney disease) stage 3, GFR 30-59 ml/min (HCC), Diabetes mellitus, Diastolic dysfunction, HTN (hypertension), Hyperlipidemia, Non-obstructive CAD (coronary artery disease), Obesity, and PAD (peripheral artery disease) ([place]).  PSH:    Past Surgical History:  Procedure Laterality Date   ABDOMINAL AORTOGRAM W/LOWER EXTREMITY N/A 09/14/2022   Procedure: ABDOMINAL AORTOGRAM W/LOWER EXTREMITY;  Surgeon: Wellington Hampshire, MD;  Location: [place] [facility];  Service: Cardiovascular;  Laterality: N/A;   BLADDER REPAIR W/ CESAREAN SECTION  1981   BREAST BIOPSY Right 12/17/2014   negative stereotactic   CARDIAC CATHETERIZATION     COLONOSCOPY WITH PROPOFOL N/A 03/30/2021   Procedure: COLONOSCOPY WITH PROPOFOL;  Surgeon: Lesly Rubenstein, MD;  Location: [facility] ENDOSCOPY;  Service: Endoscopy;  Laterality: N/A;  DM   cyst taken off finger  2011   ESOPHAGOGASTRODUODENOSCOPY (EGD) WITH PROPOFOL N/A 03/30/2021   Procedure: ESOPHAGOGASTRODUODENOSCOPY (EGD) WITH PROPOFOL;  Surgeon: Lesly Rubenstein, MD;  Location: [facility];  Service: Endoscopy;  Laterality: N/A;   LEFT HEART CATH AND CORONARY ANGIOGRAPHY N/A 07/21/2020   Procedure: LEFT HEART CATH AND CORONARY ANGIOGRAPHY;  Surgeon: Nelva Bush, MD;  Location: [place] [facility];  Service: Cardiovascular;  Laterality: N/A;   PERIPHERAL VASCULAR ATHERECTOMY Left 09/14/2022   Procedure: PERIPHERAL VASCULAR ATHERECTOMY;  Surgeon: Wellington Hampshire, MD;  Location: [place] [facility];  Service: Cardiovascular;  Laterality: Left;  SFA   PERIPHERAL VASCULAR BALLOON ANGIOPLASTY Left 09/14/2022   Procedure: PERIPHERAL VASCULAR BALLOON ANGIOPLASTY;  Surgeon: Wellington Hampshire, MD;  Location: [place] [facility];  Service: Cardiovascular;  Laterality: Left;  SFA   TOE SURGERY  02/08/2013    Current Outpatient Medications  Medication Sig Dispense Refill   aspirin (ASPIRIN 81) 81 MG EC tablet Take 81 mg by  mouth daily.     atorvastatin (LIPITOR) 40 MG tablet TAKE ONE TABLET BY MOUTH EVERY DAY 30 tablet 11   butalbital-acetaminophen-caffeine (FIORICET) 50-325-40 MG tablet Take 2 tablets by mouth every 6 (six) hours as needed for headache or migraine. 30 tablet 0   carvedilol (COREG) 12.5 MG tablet TAKE 1 TABLET BY MOUTH 2 TIMES A DAY WITH A MEAL 180 tablet 1   cetirizine (ZYRTEC) 10 MG tablet Take 10 mg by mouth daily.     Cholecalciferol (EQL VITAMIN D3) 25 MCG (1000 UT) capsule Take 1,000 Units by mouth daily.     empagliflozin (JARDIANCE) 10 MG TABS tablet Take 1 tablet (10 mg total) by mouth daily. 30 tablet 11   Ferrous Sulfate (IRON) 325 (65 Fe) MG TABS Take 325 mg by mouth daily.     fluticasone (FLONASE) 50 MCG/ACT nasal spray Place 1 spray into both nostrils daily as needed for rhinitis.     furosemide (LASIX) 20 MG tablet Take 1 tablet (20 mg total) by mouth daily as needed. 30 tablet 11   gabapentin (NEURONTIN) 300 MG capsule Take 300 mg by mouth 2 (two) times daily.      Lifitegrast (XIIDRA) 5 % SOLN Place 1 drop into both eyes 2 (two) times daily.     liraglutide (VICTOZA) 18 MG/3ML SOPN Inject 1.2 mg into the skin  daily.     lisinopril (ZESTRIL) 10 MG tablet TAKE 1 TABLET BY MOUTH ONCE DAILY. 90 tablet 1   metFORMIN (GLUCOPHAGE) 1000 MG tablet Take 1,000 mg by mouth 2 (two) times daily.     ondansetron (ZOFRAN-ODT) 4 MG disintegrating tablet Take 1 tablet (4 mg total) by mouth every 6 (six) hours as needed for nausea or vomiting. 20 tablet 0   venlafaxine (EFFEXOR) 37.5 MG tablet Take 37.5 mg by mouth daily.     vitamin B-12 (CYANOCOBALAMIN) 1000 MCG tablet Take 1,000 mcg by mouth daily.     No current facility-administered medications for this visit.    Allergies:   Pregabalin   Social History:  The patient  reports that she has never smoked. She has never used smokeless tobacco. She reports that she does not drink alcohol and does not use drugs.   Family History:   family  history includes COPD in her mother; Diabetes in her father; Heart failure in her father; Hypertension in her father; Lupus in her father.   Review of Systems: Review of Systems  Constitutional: Negative.   HENT: Negative.    Respiratory: Negative.    Cardiovascular: Negative.   Gastrointestinal: Negative.   Musculoskeletal: Negative.   Neurological: Negative.   Psychiatric/Behavioral: Negative.    All other systems reviewed and are negative.   PHYSICAL EXAM: VS:  BP 120/60 (BP Location: Left Arm, Patient Position: Sitting, Cuff Size: Large)   Pulse 74   Ht 5\' 6"  (1.676 m)   Wt 199 lb 2 oz (90.3 kg)   LMP 09/12/1999   SpO2 98%   BMI 32.14 kg/m  , BMI Body mass index is 32.14 kg/m. Constitutional:  oriented to person, place, and time. No distress.  HENT:  Head: Grossly normal Eyes:  no discharge. No scleral icterus.  Neck: No JVD, no carotid bruits  Cardiovascular: Regular rate and rhythm, no murmurs appreciated Pulmonary/Chest: Clear to auscultation bilaterally, no wheezes or rails Abdominal: Soft.  no distension.  no tenderness.  Musculoskeletal: Normal range of motion Neurological:  normal muscle tone. Coordination normal. No atrophy Skin: Skin warm and dry Psychiatric: normal affect, pleasant  Recent Labs: 12/16/2022: ALT 15; BUN 22; Creatinine, Ser 1.09; Hemoglobin 14.1; Platelets 142; Potassium 4.3; Sodium 139    Lipid Panel Lab Results  Component Value Date   CHOL 108 07/20/2020   HDL 28 (L) 07/20/2020   LDLCALC 62 07/20/2020   TRIG 88 07/20/2020      Wt Readings from Last 3 Encounters:  01/02/23 199 lb 2 oz (90.3 kg)  12/16/22 201 lb (91.2 kg)  10/21/22 203 lb 3.2 oz (92.2 kg)     ASSESSMENT AND PLAN:  Problem List Items Addressed This Visit       Cardiology Problems   CAD, NATIVE VESSEL - Primary     Other   CKD (chronic kidney disease) stage 3, GFR 30-59 ml/min (HCC)   Other Visit Diagnoses     PAD (peripheral artery disease) ([place])        Essential hypertension       Hyperlipidemia LDL goal <70       Coronary artery disease involving native coronary artery of native heart without angina pectoris       Hyperlipidemia, unspecified hyperlipidemia type       Left carotid bruit         CAD  Currently with no symptoms of angina. No further workup at this time. Continue current medication regimen.  Leg edema Minimal,  stable  Orthostasis Blood pressure stable, no orthostasis symptoms, no changes to blood pressure medication  Hyperlipidemia Continue Lipitor 40, recommend she asked primary care for lipid panel if possible  PAD: Recent angioplasty left SFA Occluded left popliteal, medical management recommended Denies claudication symptoms Stressed importance of aggressive diabetes and cholesterol control     Total encounter time more than 30 minutes  Greater than 50% was spent in counseling and coordination of care with the patient    Signed, Esmond Plants, M.D., Ph.D. [place], [place]

## 2023-01-02 ENCOUNTER — Encounter: Payer: Self-pay | Admitting: Cardiovascular Disease

## 2023-01-02 ENCOUNTER — Ambulatory Visit: Payer: Medicare HMO | Attending: Nurse Practitioner | Admitting: Cardiovascular Disease

## 2023-01-02 VITALS — BP 120/60 | HR 74 | Ht 66.0 in | Wt 199.1 lb

## 2023-01-02 DIAGNOSIS — E785 Hyperlipidemia, unspecified: Secondary | ICD-10-CM | POA: Diagnosis not present

## 2023-01-02 DIAGNOSIS — I10 Essential (primary) hypertension: Secondary | ICD-10-CM | POA: Diagnosis not present

## 2023-01-02 DIAGNOSIS — R0989 Other specified symptoms and signs involving the circulatory and respiratory systems: Secondary | ICD-10-CM

## 2023-01-02 DIAGNOSIS — N1831 Chronic kidney disease, stage 3a: Secondary | ICD-10-CM

## 2023-01-02 DIAGNOSIS — I2510 Atherosclerotic heart disease of native coronary artery without angina pectoris: Secondary | ICD-10-CM | POA: Diagnosis not present

## 2023-01-02 DIAGNOSIS — I739 Peripheral vascular disease, unspecified: Secondary | ICD-10-CM

## 2023-01-02 MED ORDER — LISINOPRIL 10 MG PO TABS: 10.0000 mg | ORAL_TABLET | Freq: Every day | ORAL | 2 refills | Status: DC

## 2023-01-02 MED ORDER — CARVEDILOL 12.5 MG PO TABS: ORAL_TABLET | ORAL | 2 refills | Status: DC

## 2023-01-02 MED ORDER — FUROSEMIDE 20 MG PO TABS: 20.0000 mg | ORAL_TABLET | Freq: Every day | ORAL | 2 refills | Status: AC | PRN

## 2023-01-02 NOTE — Patient Instructions (Addendum)
Goal LDL <70 (less than 60)  Medication Instructions:  No changes  If you need a refill on your cardiac medications before your next appointment, please call your pharmacy.   Lab work: No new labs needed  Testing/Procedures: No new testing needed  Follow-Up: At [facility], you and your health needs are our priority.  As part of our continuing mission to provide you with exceptional heart care, we have created designated Provider Care Teams.  These Care Teams include your primary Cardiologist (physician) and Advanced Practice Providers (APPs -  Physician Assistants and Nurse Practitioners) who all work together to provide you with the care you need, when you need it.  You will need a follow up appointment in 12 months  Providers on your designated Care Team:   Murray Hodgkins, NP Christell Faith, PA-C Cadence Kathlen Mody, [place]  COVID-19 Vaccine Information can be found at: [email or web address] For questions related to vaccine distribution or appointments, please email [email or web address] or call [phone].

## 2023-01-06 ENCOUNTER — Other Ambulatory Visit: Payer: Self-pay | Admitting: Nurse Practitioner

## 2023-01-06 DIAGNOSIS — Z780 Asymptomatic menopausal state: Secondary | ICD-10-CM

## 2023-02-22 ENCOUNTER — Ambulatory Visit
Admission: RE | Admit: 2023-02-22 | Discharge: 2023-02-22 | Disposition: A | Discharge status: Home / Self Care | Payer: Medicare HMO | Source: Ambulatory Visit | Attending: Nurse Practitioner | Admitting: Nurse Practitioner

## 2023-02-22 DIAGNOSIS — Z780 Asymptomatic menopausal state: Secondary | ICD-10-CM

## 2023-04-13 ENCOUNTER — Ambulatory Visit
Admission: RE | Admit: 2023-04-13 | Discharge: 2023-04-13 | Disposition: A | Discharge status: Home / Self Care | Payer: Medicare HMO | Source: Ambulatory Visit | Attending: Unknown Physician Specialty | Admitting: Unknown Physician Specialty

## 2023-04-13 DIAGNOSIS — E041 Nontoxic single thyroid nodule: Secondary | ICD-10-CM

## 2023-04-20 ENCOUNTER — Other Ambulatory Visit: Payer: Self-pay | Admitting: Unknown Physician Specialty

## 2023-04-20 DIAGNOSIS — E041 Nontoxic single thyroid nodule: Secondary | ICD-10-CM

## 2023-04-24 ENCOUNTER — Telehealth: Payer: Self-pay | Admitting: Cardiovascular Disease

## 2023-04-24 NOTE — Telephone Encounter (Signed)
Spoke with the patient and advised that she still needs to have LE duplex done.

## 2023-04-24 NOTE — Telephone Encounter (Signed)
Pt wants to know if its necessary to have ultrasound done of her legs again.

## 2023-04-25 ENCOUNTER — Ambulatory Visit: Payer: Medicare HMO | Attending: Physician Assistant

## 2023-04-25 DIAGNOSIS — I739 Peripheral vascular disease, unspecified: Secondary | ICD-10-CM

## 2023-04-25 LAB — VAS US ABI WITH/WO TBI
Left ABI: 0.67
Right ABI: 0.87

## 2023-04-28 ENCOUNTER — Other Ambulatory Visit: Payer: Self-pay | Admitting: *Deleted

## 2023-04-28 DIAGNOSIS — I739 Peripheral vascular disease, unspecified: Secondary | ICD-10-CM

## 2023-05-02 ENCOUNTER — Ambulatory Visit: Payer: Medicare HMO | Attending: Cardiovascular Disease | Admitting: Cardiovascular Disease

## 2023-05-02 ENCOUNTER — Telehealth: Payer: Self-pay | Admitting: *Deleted

## 2023-05-02 ENCOUNTER — Other Ambulatory Visit
Admission: RE | Admit: 2023-05-02 | Discharge: 2023-05-02 | Disposition: A | Discharge status: Home / Self Care | Payer: Medicare HMO | Source: Ambulatory Visit | Attending: Cardiovascular Disease | Admitting: Cardiovascular Disease

## 2023-05-02 ENCOUNTER — Encounter: Payer: Self-pay | Admitting: Cardiovascular Disease

## 2023-05-02 VITALS — BP 150/62 | HR 75 | Ht 66.0 in | Wt 205.1 lb

## 2023-05-02 DIAGNOSIS — I739 Peripheral vascular disease, unspecified: Secondary | ICD-10-CM | POA: Insufficient documentation

## 2023-05-02 DIAGNOSIS — I10 Essential (primary) hypertension: Secondary | ICD-10-CM | POA: Diagnosis present

## 2023-05-02 DIAGNOSIS — R0989 Other specified symptoms and signs involving the circulatory and respiratory systems: Secondary | ICD-10-CM

## 2023-05-02 DIAGNOSIS — E785 Hyperlipidemia, unspecified: Secondary | ICD-10-CM

## 2023-05-02 DIAGNOSIS — I2510 Atherosclerotic heart disease of native coronary artery without angina pectoris: Secondary | ICD-10-CM | POA: Diagnosis not present

## 2023-05-02 LAB — BASIC METABOLIC PANEL
Anion gap: 9 (ref 5–15)
BUN: 19 mg/dL (ref 8–23)
CO2: 27 mmol/L (ref 22–32)
Calcium: 9.1 mg/dL (ref 8.9–10.3)
Chloride: 101 mmol/L (ref 98–111)
Creatinine, Ser: 1.18 mg/dL — ABNORMAL HIGH (ref 0.44–1.00)
GFR, Estimated: 51 mL/min — ABNORMAL LOW (ref >60)
Glucose, Bld: 136 mg/dL — ABNORMAL HIGH (ref 70–99)
Potassium: 4.2 mmol/L (ref 3.5–5.1)
Sodium: 137 mmol/L (ref 135–145)

## 2023-05-02 LAB — CBC
HCT: 39 % (ref 36.0–46.0)
Hemoglobin: 12.7 g/dL (ref 12.0–15.0)
MCH: 31.2 pg (ref 26.0–34.0)
MCHC: 32.6 g/dL (ref 30.0–36.0)
MCV: 95.8 fL (ref 80.0–100.0)
Platelets: 157 10*3/uL (ref 150–400)
RBC: 4.07 MIL/uL (ref 3.87–5.11)
RDW: 12.8 % (ref 11.5–15.5)
WBC: 7.4 10*3/uL (ref 4.0–10.5)
nRBC: 0 % (ref 0.0–0.2)

## 2023-05-02 NOTE — Telephone Encounter (Signed)
Reviewed medication instructions for 5/22/424 procedure with patient:  05/02/23 GFR 51-per protocol -pt will hold lisinopril/lasix day before and day of procedure. Pt reports she takes lisinopril in evening (will hold this evening) and lasix prn.  In addition:   Hold:  Metformin-day of procedure and 48 hours post procedure  Victoza/Jardiance-AM of procedure Pt instructed to take aspirin 81 mg and any of her other usual morning medications.

## 2023-05-02 NOTE — Patient Instructions (Addendum)
Medication Instructions:  No changes *If you need a refill on your cardiac medications before your next appointment, please call your pharmacy*  Testing/Procedures: Your physician has requested that you have a peripheral vascular angiogram. This exam is performed at the hospital. During this exam IV contrast is used to look at arterial blood flow. Please review the information sheet given for details.  Follow-Up: At [facility], you and your health needs are our priority.  As part of our continuing mission to provide you with exceptional heart care, we have created designated Provider Care Teams.  These Care Teams include your primary Cardiologist (physician) and Advanced Practice Providers (APPs -  Physician Assistants and Nurse Practitioners) who all work together to provide you with the care you need, when you need it.  We recommend signing up for the patient portal called "MyChart".  Sign up information is provided on this After Visit Summary.  MyChart is used to connect with patients for Virtual Visits (Telemedicine).  Patients are able to view lab/test results, encounter notes, upcoming appointments, etc.  Non-urgent messages can be sent to your provider as well.   To learn more about what you can do with MyChart, go to [email or web address].    Your next appointment:   Follow up in 4 weeks with Dr. Kirke Corin or APP Other Instructions  [facility] A DEPT OF . [facility] AT [facility] [address] [place] [address] Dept: [phone] Loc: [phone]  Jasmine Davidson  05/02/2023  You are scheduled for a Peripheral Angiogram on Wednesday, May 22 with Dr. Lorine Bears.  1. Please arrive at the [facility] (Main Entrance A) at [facility]: [address] [place], [place] [address] at 10:30 AM (This time is 2 hour(s) before your procedure to ensure your preparation). Free valet parking  service is available. You will check in at ADMITTING. The support person will be asked to wait in the waiting room.  It is OK to have someone drop you off and come back when you are ready to be discharged.    Special note: Every effort is made to have your procedure done on time. Please understand that emergencies sometimes delay scheduled procedures.  2. Diet: Do not eat solid foods after midnight.  The patient may have clear liquids until 5am upon the day of the procedure.  3. Labs: You will need to have blood drawn on 05/02/23. You do not need to be fasting.  4. Medication instructions in preparation for your procedure: Hold the Metformin the morning of the procedure and then 48 hours after. Hold the Furosemide the morning of the procedure Hold all diabetic medication the morning of the procedure  On the morning of your procedure, take your Aspirin 81 mg and any morning medicines NOT listed above.  You may use sips of water.  5. Plan to go home the same day, you will only stay overnight if medically necessary. 6. Bring a current list of your medications and current insurance cards. 7. You MUST have a responsible person to drive you home. 8. Someone MUST be with you the first 24 hours after you arrive home or your discharge will be delayed. 9. Please wear clothes that are easy to get on and off and wear slip-on shoes.  Thank you for allowing [place] to care for you!   -- [facility]

## 2023-05-02 NOTE — H&P (View-Only) (Signed)
  Cardiology Office Note   Date:  05/02/2023   ID:  Jasmine Davidson, DOB 07/12/1956, MRN [id number]  PCP:  Revelo, Jasmine Mancheno, MD  Cardiologist: Dr. Gollan  Chief Complaint  Patient presents with   Follow-up    6 Month f/u no complaints today discuss results. Meds reviewed verbally with pt.      History of Present Illness: Jasmine Davidson is a 67 y.o. female who is here today for follow-up visit regarding peripheral arterial disease.   She has history of mild to moderate nonobstructive coronary artery disease, diabetes mellitus, essential hypertension, hyperlipidemia, stage III chronic kidney disease and peripheral arterial disease.  She has followed in the past at [facility] for chronic venous insufficiency status post right GSV stripping and left GSV venous ablation.  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  She is followed for peripheral arterial disease.  Vascular studies in September 2022 showed an ABI of 0.97 on the right and 0.52 on the left.  Duplex showed no significant disease on the right side.  On the left side, there was suspected significant disease versus short occlusion in the distal SFA/proximal popliteal artery.    She had worsening claudication in 2023.  Thus, I proceeded with angiography in October which showed no significant aortoiliac disease but the aorta and the iliac arteries were significantly calcified.  On the left side, there was heavily calcified stenosis in the proximal SFA, short occlusion of the proximal popliteal artery with reconstitution via extensive collaterals above the knee and three-vessel runoff below the knee.  I performed directional atherectomy and drug-coated balloon angioplasty to the proximal left SFA.  The popliteal artery occlusion was left to be treated medically.  She stubbed her right big toe about 1 month ago and developed an ulceration that  has not healed since then.  In addition, she developed dark discoloration around the ankle area with significant discomfort.  She was treated with amoxicillin for 1 week and just finished yesterday.  There has been no significant improvement since then.  She underwent recent Doppler studies which showed a drop in ABI on the right side to 0.87 with stable ABI on the left side at 0.67.  Duplex on the right showed moderate iliac disease, moderate SFA disease and at least two-vessel runoff below the knee.  On the left, the proximal SFA was patent with normal velocities.  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  Past Medical History:  Diagnosis Date   CKD (chronic kidney disease) stage 3, GFR 30-59 ml/min (HCC)    Diabetes mellitus    Diastolic dysfunction    a. 07/2020 Echo: EF 65-70%, no rwma, GrI DD, nl RV fxn, mod dil LA.   HTN (hypertension)    Hyperlipidemia    Non-obstructive CAD (coronary artery disease)    a. 07/2020 Cath: LM nl, LAD 45m, RI small-mild diff dzs, LCX nl, RCA min irregs.   Obesity    PAD (peripheral artery disease) (HCC)    a. 09/2022 LE Angio/PTA: Heavily Ca2+ Abd Ao and iliacs. LCIA 30, LSFA 70p (atherectomy/DBA), L Pop 100 above-knee w/ reconstitution via collats above knee joint and 3V runoff; b. 10/2022 ABI/Duplex: R 1.11, L 0.64. Patent L SFA site. Chronically occluded prox L pop.    Past Surgical History:  Procedure Laterality Date   ABDOMINAL AORTOGRAM W/LOWER EXTREMITY N/A 09/14/2022   Procedure: ABDOMINAL AORTOGRAM W/LOWER EXTREMITY;    Surgeon: Delorice Bannister A, MD;  Location: [facility];  Service: Cardiovascular;  Laterality: N/A;   BLADDER REPAIR W/ CESAREAN SECTION  1981   BREAST BIOPSY Right 12/17/2014   negative stereotactic   CARDIAC CATHETERIZATION     COLONOSCOPY WITH PROPOFOL N/A 03/30/2021   Procedure: COLONOSCOPY WITH PROPOFOL;  Surgeon: Locklear, Cameron T, MD;  Location: [facility] ENDOS[facility];  Service: Endoscopy;  Laterality: N/A;  DM    cyst taken off finger  2011   ESOPHAGOGASTRODUODENOSCOPY (EGD) WITH PROPOFOL N/A 03/30/2021   Procedure: ESOPHAGOGASTRODUODENOSCOPY (EGD) WITH PROPOFOL;  Surgeon: Locklear, Cameron T, MD;  Location: [facility] ENDOSCOPY;  Service: Endoscopy;  Laterality: N/A;   LEFT HEART CATH AND CORONARY ANGIOGRAPHY N/A 07/21/2020   Procedure: LEFT HEART CATH AND CORONARY ANGIOGRAPHY;  Surgeon: End, Christopher, MD;  Location: [facility];  Service: Cardiovascular;  Laterality: N/A;   PERIPHERAL VASCULAR ATHERECTOMY Left 09/14/2022   Procedure: PERIPHERAL VASCULAR ATHERECTOMY;  Surgeon: Carrington Olazabal A, MD;  Location: [facility];  Service: Cardiovascular;  Laterality: Left;  SFA   PERIPHERAL VASCULAR BALLOON ANGIOPLASTY Left 09/14/2022   Procedure: PERIPHERAL VASCULAR BALLOON ANGIOPLASTY;  Surgeon: Rickey Farrier A, MD;  Location: [facility];  Service: Cardiovascular;  Laterality: Left;  SFA   TOE SURGERY  02/08/2013     Current Outpatient Medications  Medication Sig Dispense Refill   aspirin (ASPIRIN 81) 81 MG EC tablet Take 81 mg by mouth daily.     atorvastatin (LIPITOR) 40 MG tablet TAKE ONE TABLET BY MOUTH EVERY DAY 30 tablet 11   butalbital-acetaminophen-caffeine (FIORICET) 50-325-40 MG tablet Take 2 tablets by mouth every 6 (six) hours as needed for headache or migraine. 30 tablet 0   carvedilol (COREG) 12.5 MG tablet TAKE 1 TABLET BY MOUTH 2 TIMES A DAY WITH A MEAL 180 tablet 2   cetirizine (ZYRTEC) 10 MG tablet Take 10 mg by mouth daily.     Cholecalciferol (EQL VITAMIN D3) 25 MCG (1000 UT) capsule Take 1,000 Units by mouth daily.     empagliflozin (JARDIANCE) 10 MG TABS tablet Take 1 tablet (10 mg total) by mouth daily. 30 tablet 11   Ferrous Sulfate (IRON) 325 (65 Fe) MG TABS Take 325 mg by mouth daily.     fluticasone (FLONASE) 50 MCG/ACT nasal spray Place 1 spray into both nostrils daily as needed for rhinitis.     furosemide (LASIX) 20 MG tablet Take 1 tablet (20 mg total) by  mouth daily as needed. 90 tablet 2   gabapentin (NEURONTIN) 300 MG capsule Take 300 mg by mouth 2 (two) times daily.      Lifitegrast (XIIDRA) 5 % SOLN Place 1 drop into both eyes 2 (two) times daily.     liraglutide (VICTOZA) 18 MG/3ML SOPN Inject 1.2 mg into the skin daily.     lisinopril (ZESTRIL) 10 MG tablet Take 1 tablet (10 mg total) by mouth daily. 90 tablet 2   metFORMIN (GLUCOPHAGE) 1000 MG tablet Take 1,000 mg by mouth 2 (two) times daily.     ondansetron (ZOFRAN-ODT) 4 MG disintegrating tablet Take 1 tablet (4 mg total) by mouth every 6 (six) hours as needed for nausea or vomiting. 20 tablet 0   venlafaxine (EFFEXOR) 37.5 MG tablet Take 37.5 mg by mouth daily.     vitamin B-12 (CYANOCOBALAMIN) 1000 MCG tablet Take 1,000 mcg by mouth daily.     No current facility-administered medications for this visit.    Allergies:   Pregabalin      Social History:  The patient  reports that she has never smoked. She has never used smokeless tobacco. She reports that she does not drink alcohol and does not use drugs.   Family History:  The patient's family history includes COPD in her mother; Diabetes in her father; Heart failure in her father; Hypertension in her father; Lupus in her father.    ROS:  Please see the history of present illness.   Otherwise, review of systems are positive for none.   All other systems are reviewed and negative.    PHYSICAL EXAM: VS:  BP (!) 150/60 (BP Location: Left Arm, Patient Position: Sitting, Cuff Size: Large)   Pulse 75   Ht 5' 6" (1.676 m)   Wt 205 lb 2 oz (93 kg)   LMP 09/12/1999   SpO2 98%   BMI 33.11 kg/m  , BMI Body mass index is 33.11 kg/m. GEN: Well nourished, well developed, in no acute distress  HEENT: normal  Neck: no JVD  or masses.  Left carotid bruit Cardiac: RRR; no murmurs, rubs, or gallops,no edema  Respiratory:  clear to auscultation bilaterally, normal work of breathing GI: soft, nontender, nondistended, + BS MS: no deformity  or atrophy  Skin: warm and dry, no rash Neuro:  Strength and sensation are intact Psych: euthymic mood, full affect Vascular: Femoral pulses normal bilaterally.  Dorsalis pedis is faint on the right. There is a small superficial ulceration at the tip of the right big toe.   EKG:  EKG is not ordered today.    Recent Labs: 12/16/2022: ALT 15; BUN 22; Creatinine, Ser 1.09; Hemoglobin 14.1; Platelets 142; Potassium 4.3; Sodium 139    Lipid Panel    Component Value Date/Time   CHOL 108 07/20/2020 0434   TRIG 88 07/20/2020 0434   TRIG 49 11/16/2009 0000   HDL 28 (L) 07/20/2020 0434   CHOLHDL 3.9 07/20/2020 0434   VLDL 18 07/20/2020 0434   LDLCALC 62 07/20/2020 0434   LDLCALC 58 11/16/2009 0000      Wt Readings from Last 3 Encounters:  05/02/23 205 lb 2 oz (93 kg)  01/02/23 199 lb 2 oz (90.3 kg)  12/16/22 201 lb (91.2 kg)           No data to display            ASSESSMENT AND PLAN:  1.  Peripheral arterial disease: Status post left SFA atherectomy and drug-coated balloon angioplasty with resolution of claudication on the left side.  However, she now has nonhealing ulceration on the right big toe with a drop in her ABI and evidence of borderline disease in the right SFA.  Due to that, I recommend proceeding with abdominal aortogram with right lower extremity angiography and possible endovascular intervention. I discussed the procedure in details as well as risks and benefits.  2.  Coronary artery disease involving native coronary arteries without angina: She reports no anginal symptoms.  Continue medical therapy.  3.  Hyperlipidemia: Continue atorvastatin 40 mg once daily with a target LDL of less than 70.  4.  Essential hypertension: Blood pressure is mildly elevated.  Consider increasing the dose of lisinopril if blood pressure remains elevated.  5.  Left carotid bruit: Most recent carotid Doppler in October 2023 showed mild nonobstructive disease  bilaterally.    Disposition:   Proceed with an angiogram and follow-up after.  Signed,  Bharath Bernstein, MD  05/02/2023 1:32 PM    [facility] 

## 2023-05-02 NOTE — Progress Notes (Signed)
Cardiology Office Note   Date:  05/02/2023   ID:  [name], DOB Aug 30, 1956, MRN [id number]  PCP:  Preston Fleeting, MD  Cardiologist: Dr. Mariah Milling  Chief Complaint  Patient presents with   Follow-up    6 Month f/u no complaints today discuss results. Meds reviewed verbally with pt.      History of Present Illness: Jasmine Davidson is a 67 y.o. female who is here today for follow-up visit regarding peripheral arterial disease.   She has history of mild to moderate nonobstructive coronary artery disease, diabetes mellitus, essential hypertension, hyperlipidemia, stage III chronic kidney disease and peripheral arterial disease.  She has followed in the past at [facility] vascular surgery clinic for chronic venous insufficiency status post right GSV stripping and left GSV venous ablation.  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  She is followed for peripheral arterial disease.  Vascular studies in September 2022 showed an ABI of 0.97 on the right and 0.52 on the left.  Duplex showed no significant disease on the right side.  On the left side, there was suspected significant disease versus short occlusion in the distal SFA/proximal popliteal artery.    She had worsening claudication in 2023.  Thus, I proceeded with angiography in October which showed no significant aortoiliac disease but the aorta and the iliac arteries were significantly calcified.  On the left side, there was heavily calcified stenosis in the proximal SFA, short occlusion of the proximal popliteal artery with reconstitution via extensive collaterals above the knee and three-vessel runoff below the knee.  I performed directional atherectomy and drug-coated balloon angioplasty to the proximal left SFA.  The popliteal artery occlusion was left to be treated medically.  She stubbed her right big toe about 1 month ago and developed an ulceration that  has not healed since then.  In addition, she developed dark discoloration around the ankle area with significant discomfort.  She was treated with amoxicillin for 1 week and just finished yesterday.  There has been no significant improvement since then.  She underwent recent Doppler studies which showed a drop in ABI on the right side to 0.87 with stable ABI on the left side at 0.67.  Duplex on the right showed moderate iliac disease, moderate SFA disease and at least two-vessel runoff below the knee.  On the left, the proximal SFA was patent with normal velocities.  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  Past Medical History:  Diagnosis Date   CKD (chronic kidney disease) stage 3, GFR 30-59 ml/min (HCC)    Diabetes mellitus    Diastolic dysfunction    a. 07/2020 Echo: EF 65-70%, no rwma, GrI DD, nl RV fxn, mod dil LA.   HTN (hypertension)    Hyperlipidemia    Non-obstructive CAD (coronary artery disease)    a. 07/2020 Cath: LM nl, LAD 21m, RI small-mild diff dzs, LCX nl, RCA min irregs.   Obesity    PAD (peripheral artery disease) (HCC)    a. 09/2022 LE Angio/PTA: Heavily Ca2+ Abd Ao and iliacs. LCIA 30, LSFA 70p (atherectomy/DBA), L Pop 100 above-knee w/ reconstitution via collats above knee joint and 3V runoff; b. 10/2022 ABI/Duplex: R 1.11, L 0.64. Patent L SFA site. Chronically occluded prox L pop.    Past Surgical History:  Procedure Laterality Date   ABDOMINAL AORTOGRAM W/LOWER EXTREMITY N/A 09/14/2022   Procedure: ABDOMINAL AORTOGRAM W/LOWER EXTREMITY;  Surgeon: Iran Ouch, MD;  Location: [facility] INVASIVE CV LAB;  Service: Cardiovascular;  Laterality: N/A;   BLADDER REPAIR W/ CESAREAN SECTION  1981   BREAST BIOPSY Right 12/17/2014   negative stereotactic   CARDIAC CATHETERIZATION     COLONOSCOPY WITH PROPOFOL N/A 03/30/2021   Procedure: COLONOSCOPY WITH PROPOFOL;  Surgeon: Regis Bill, MD;  Location: [facility] ENDOSCOPY;  Service: Endoscopy;  Laterality: N/A;  DM    cyst taken off finger  2011   ESOPHAGOGASTRODUODENOSCOPY (EGD) WITH PROPOFOL N/A 03/30/2021   Procedure: ESOPHAGOGASTRODUODENOSCOPY (EGD) WITH PROPOFOL;  Surgeon: Regis Bill, MD;  Location: [facility] ENDOSCOPY;  Service: Endoscopy;  Laterality: N/A;   LEFT HEART CATH AND CORONARY ANGIOGRAPHY N/A 07/21/2020   Procedure: LEFT HEART CATH AND CORONARY ANGIOGRAPHY;  Surgeon: Yvonne Kendall, MD;  Location: [facility] INVASIVE CV LAB;  Service: Cardiovascular;  Laterality: N/A;   PERIPHERAL VASCULAR ATHERECTOMY Left 09/14/2022   Procedure: PERIPHERAL VASCULAR ATHERECTOMY;  Surgeon: Iran Ouch, MD;  Location: [facility] INVASIVE CV LAB;  Service: Cardiovascular;  Laterality: Left;  SFA   PERIPHERAL VASCULAR BALLOON ANGIOPLASTY Left 09/14/2022   Procedure: PERIPHERAL VASCULAR BALLOON ANGIOPLASTY;  Surgeon: Iran Ouch, MD;  Location: [facility] INVASIVE CV LAB;  Service: Cardiovascular;  Laterality: Left;  SFA   TOE SURGERY  02/08/2013     Current Outpatient Medications  Medication Sig Dispense Refill   aspirin (ASPIRIN 81) 81 MG EC tablet Take 81 mg by mouth daily.     atorvastatin (LIPITOR) 40 MG tablet TAKE ONE TABLET BY MOUTH EVERY DAY 30 tablet 11   butalbital-acetaminophen-caffeine (FIORICET) 50-325-40 MG tablet Take 2 tablets by mouth every 6 (six) hours as needed for headache or migraine. 30 tablet 0   carvedilol (COREG) 12.5 MG tablet TAKE 1 TABLET BY MOUTH 2 TIMES A DAY WITH A MEAL 180 tablet 2   cetirizine (ZYRTEC) 10 MG tablet Take 10 mg by mouth daily.     Cholecalciferol (EQL VITAMIN D3) 25 MCG (1000 UT) capsule Take 1,000 Units by mouth daily.     empagliflozin (JARDIANCE) 10 MG TABS tablet Take 1 tablet (10 mg total) by mouth daily. 30 tablet 11   Ferrous Sulfate (IRON) 325 (65 Fe) MG TABS Take 325 mg by mouth daily.     fluticasone (FLONASE) 50 MCG/ACT nasal spray Place 1 spray into both nostrils daily as needed for rhinitis.     furosemide (LASIX) 20 MG tablet Take 1 tablet (20 mg total) by  mouth daily as needed. 90 tablet 2   gabapentin (NEURONTIN) 300 MG capsule Take 300 mg by mouth 2 (two) times daily.      Lifitegrast (XIIDRA) 5 % SOLN Place 1 drop into both eyes 2 (two) times daily.     liraglutide (VICTOZA) 18 MG/3ML SOPN Inject 1.2 mg into the skin daily.     lisinopril (ZESTRIL) 10 MG tablet Take 1 tablet (10 mg total) by mouth daily. 90 tablet 2   metFORMIN (GLUCOPHAGE) 1000 MG tablet Take 1,000 mg by mouth 2 (two) times daily.     ondansetron (ZOFRAN-ODT) 4 MG disintegrating tablet Take 1 tablet (4 mg total) by mouth every 6 (six) hours as needed for nausea or vomiting. 20 tablet 0   venlafaxine (EFFEXOR) 37.5 MG tablet Take 37.5 mg by mouth daily.     vitamin B-12 (CYANOCOBALAMIN) 1000 MCG tablet Take 1,000 mcg by mouth daily.     No current facility-administered medications for this visit.    Allergies:   Pregabalin  Social History:  The patient  reports that she has never smoked. She has never used smokeless tobacco. She reports that she does not drink alcohol and does not use drugs.   Family History:  The patient's family history includes COPD in her mother; Diabetes in her father; Heart failure in her father; Hypertension in her father; Lupus in her father.    ROS:  Please see the history of present illness.   Otherwise, review of systems are positive for none.   All other systems are reviewed and negative.    PHYSICAL EXAM: VS:  BP (!) 150/60 (BP Location: Left Arm, Patient Position: Sitting, Cuff Size: Large)   Pulse 75   Ht 5\' 6"  (1.676 m)   Wt 205 lb 2 oz (93 kg)   LMP 09/12/1999   SpO2 98%   BMI 33.11 kg/m  , BMI Body mass index is 33.11 kg/m. GEN: Well nourished, well developed, in no acute distress  HEENT: normal  Neck: no JVD  or masses.  Left carotid bruit Cardiac: RRR; no murmurs, rubs, or gallops,no edema  Respiratory:  clear to auscultation bilaterally, normal work of breathing GI: soft, nontender, nondistended, + BS MS: no deformity  or atrophy  Skin: warm and dry, no rash Neuro:  Strength and sensation are intact Psych: euthymic mood, full affect Vascular: Femoral pulses normal bilaterally.  Dorsalis pedis is faint on the right. There is a small superficial ulceration at the tip of the right big toe.   EKG:  EKG is not ordered today.    Recent Labs: 12/16/2022: ALT 15; BUN 22; Creatinine, Ser 1.09; Hemoglobin 14.1; Platelets 142; Potassium 4.3; Sodium 139    Lipid Panel    Component Value Date/Time   CHOL 108 07/20/2020 0434   TRIG 88 07/20/2020 0434   TRIG 49 11/16/2009 0000   HDL 28 (L) 07/20/2020 0434   CHOLHDL 3.9 07/20/2020 0434   VLDL 18 07/20/2020 0434   LDLCALC 62 07/20/2020 0434   LDLCALC 58 11/16/2009 0000      Wt Readings from Last 3 Encounters:  05/02/23 205 lb 2 oz (93 kg)  01/02/23 199 lb 2 oz (90.3 kg)  12/16/22 201 lb (91.2 kg)           No data to display            ASSESSMENT AND PLAN:  1.  Peripheral arterial disease: Status post left SFA atherectomy and drug-coated balloon angioplasty with resolution of claudication on the left side.  However, she now has nonhealing ulceration on the right big toe with a drop in her ABI and evidence of borderline disease in the right SFA.  Due to that, I recommend proceeding with abdominal aortogram with right lower extremity angiography and possible endovascular intervention. I discussed the procedure in details as well as risks and benefits.  2.  Coronary artery disease involving native coronary arteries without angina: She reports no anginal symptoms.  Continue medical therapy.  3.  Hyperlipidemia: Continue atorvastatin 40 mg once daily with a target LDL of less than 70.  4.  Essential hypertension: Blood pressure is mildly elevated.  Consider increasing the dose of lisinopril if blood pressure remains elevated.  5.  Left carotid bruit: Most recent carotid Doppler in October 2023 showed mild nonobstructive disease  bilaterally.    Disposition:   Proceed with an angiogram and follow-up after.  Signed,  Lorine Bears, MD  05/02/2023 1:32 PM    [facility]

## 2023-05-03 ENCOUNTER — Ambulatory Visit (HOSPITAL_COMMUNITY)
Admission: RE | Disposition: A | Discharge status: Home / Self Care | Payer: Self-pay | Source: Home / Self Care | Attending: Cardiovascular Disease

## 2023-05-03 ENCOUNTER — Ambulatory Visit (HOSPITAL_COMMUNITY)
Admission: RE | Admit: 2023-05-03 | Discharge: 2023-05-03 | Disposition: A | Discharge status: Home / Self Care | Payer: Medicare HMO | Attending: Cardiovascular Disease | Admitting: Cardiovascular Disease

## 2023-05-03 ENCOUNTER — Other Ambulatory Visit: Payer: Self-pay

## 2023-05-03 DIAGNOSIS — E785 Hyperlipidemia, unspecified: Secondary | ICD-10-CM | POA: Insufficient documentation

## 2023-05-03 DIAGNOSIS — Z7985 Long-term (current) use of injectable non-insulin antidiabetic drugs: Secondary | ICD-10-CM | POA: Diagnosis not present

## 2023-05-03 DIAGNOSIS — I70238 Atherosclerosis of native arteries of right leg with ulceration of other part of lower right leg: Secondary | ICD-10-CM

## 2023-05-03 DIAGNOSIS — Z79899 Other long term (current) drug therapy: Secondary | ICD-10-CM | POA: Insufficient documentation

## 2023-05-03 DIAGNOSIS — E1151 Type 2 diabetes mellitus with diabetic peripheral angiopathy without gangrene: Secondary | ICD-10-CM | POA: Insufficient documentation

## 2023-05-03 DIAGNOSIS — I70235 Atherosclerosis of native arteries of right leg with ulceration of other part of foot: Secondary | ICD-10-CM | POA: Insufficient documentation

## 2023-05-03 DIAGNOSIS — Z7984 Long term (current) use of oral hypoglycemic drugs: Secondary | ICD-10-CM | POA: Insufficient documentation

## 2023-05-03 DIAGNOSIS — E11621 Type 2 diabetes mellitus with foot ulcer: Secondary | ICD-10-CM | POA: Diagnosis not present

## 2023-05-03 DIAGNOSIS — L97519 Non-pressure chronic ulcer of other part of right foot with unspecified severity: Secondary | ICD-10-CM | POA: Diagnosis not present

## 2023-05-03 DIAGNOSIS — I129 Hypertensive chronic kidney disease with stage 1 through stage 4 chronic kidney disease, or unspecified chronic kidney disease: Secondary | ICD-10-CM | POA: Diagnosis not present

## 2023-05-03 DIAGNOSIS — N183 Chronic kidney disease, stage 3 unspecified: Secondary | ICD-10-CM | POA: Insufficient documentation

## 2023-05-03 DIAGNOSIS — I2510 Atherosclerotic heart disease of native coronary artery without angina pectoris: Secondary | ICD-10-CM | POA: Diagnosis not present

## 2023-05-03 DIAGNOSIS — I70239 Atherosclerosis of native arteries of right leg with ulceration of unspecified site: Secondary | ICD-10-CM

## 2023-05-03 DIAGNOSIS — E1122 Type 2 diabetes mellitus with diabetic chronic kidney disease: Secondary | ICD-10-CM | POA: Insufficient documentation

## 2023-05-03 DIAGNOSIS — I739 Peripheral vascular disease, unspecified: Secondary | ICD-10-CM

## 2023-05-03 HISTORY — PX: ABDOMINAL AORTOGRAM W/LOWER EXTREMITY: CATH118223

## 2023-05-03 HISTORY — PX: PERIPHERAL VASCULAR BALLOON ANGIOPLASTY: CATH118281

## 2023-05-03 LAB — GLUCOSE, CAPILLARY
Glucose-Capillary: 152 mg/dL — ABNORMAL HIGH (ref 70–99)
Glucose-Capillary: 107 mg/dL — ABNORMAL HIGH (ref 70–99)

## 2023-05-03 SURGERY — ABDOMINAL AORTOGRAM W/LOWER EXTREMITY
Anesthesia: LOCAL

## 2023-05-03 MED ORDER — HYDRALAZINE HCL 20 MG/ML IJ SOLN
INTRAMUSCULAR | Status: DC | PRN
  Administered 2023-05-03: 10 mg via INTRAVENOUS

## 2023-05-03 MED ORDER — SODIUM CHLORIDE 0.9% FLUSH: 3.0000 mL | Freq: Two times a day (BID) | INTRAVENOUS | Status: DC

## 2023-05-03 MED ORDER — HEPARIN SODIUM (PORCINE) 1000 UNIT/ML IJ SOLN
INTRAMUSCULAR | Status: AC
  Filled 2023-05-03: qty 10

## 2023-05-03 MED ORDER — FENTANYL CITRATE (PF) 100 MCG/2ML IJ SOLN
INTRAMUSCULAR | Status: AC
  Filled 2023-05-03: qty 2

## 2023-05-03 MED ORDER — MIDAZOLAM HCL 2 MG/2ML IJ SOLN
INTRAMUSCULAR | Status: DC | PRN
  Administered 2023-05-03: 1 mg via INTRAVENOUS

## 2023-05-03 MED ORDER — SODIUM CHLORIDE 0.9 % IV SOLN: INTRAVENOUS | Status: DC

## 2023-05-03 MED ORDER — LIDOCAINE HCL (PF) 1 % IJ SOLN
INTRAMUSCULAR | Status: DC | PRN
  Administered 2023-05-03: 10 mL

## 2023-05-03 MED ORDER — LIDOCAINE HCL (PF) 1 % IJ SOLN
INTRAMUSCULAR | Status: AC
  Filled 2023-05-03: qty 30

## 2023-05-03 MED ORDER — HYDRALAZINE HCL 20 MG/ML IJ SOLN
INTRAMUSCULAR | Status: AC
  Filled 2023-05-03: qty 1

## 2023-05-03 MED ORDER — SODIUM CHLORIDE 0.9 % IV SOLN: 250.0000 mL | INTRAVENOUS | Status: DC | PRN

## 2023-05-03 MED ORDER — ACETAMINOPHEN 325 MG PO TABS: 650.0000 mg | ORAL_TABLET | ORAL | Status: DC | PRN

## 2023-05-03 MED ORDER — MIDAZOLAM HCL 2 MG/2ML IJ SOLN
INTRAMUSCULAR | Status: AC
  Filled 2023-05-03: qty 2

## 2023-05-03 MED ORDER — HEPARIN SODIUM (PORCINE) 1000 UNIT/ML IJ SOLN
INTRAMUSCULAR | Status: DC | PRN
  Administered 2023-05-03: 8000 [IU] via INTRAVENOUS
  Administered 2023-05-03: 2000 [IU] via INTRAVENOUS

## 2023-05-03 MED ORDER — HEPARIN (PORCINE) IN NACL 1000-0.9 UT/500ML-% IV SOLN
INTRAVENOUS | Status: DC | PRN
  Administered 2023-05-03 (×2): 500 mL

## 2023-05-03 MED ORDER — FENTANYL CITRATE (PF) 100 MCG/2ML IJ SOLN
INTRAMUSCULAR | Status: DC | PRN
  Administered 2023-05-03: 25 ug via INTRAVENOUS

## 2023-05-03 MED ORDER — LIDOCAINE-EPINEPHRINE 1 %-1:100000 IJ SOLN
INTRAMUSCULAR | Status: AC
  Filled 2023-05-03: qty 1

## 2023-05-03 MED ORDER — CLOPIDOGREL BISULFATE 300 MG PO TABS
ORAL_TABLET | ORAL | Status: DC | PRN
  Administered 2023-05-03: 300 mg via ORAL

## 2023-05-03 MED ORDER — CLOPIDOGREL BISULFATE 300 MG PO TABS
ORAL_TABLET | ORAL | Status: AC
  Filled 2023-05-03: qty 1

## 2023-05-03 MED ORDER — HYDRALAZINE HCL 20 MG/ML IJ SOLN
5.0000 mg | INTRAMUSCULAR | Status: DC | PRN
  Administered 2023-05-03: 5 mg via INTRAVENOUS
  Filled 2023-05-03: qty 1

## 2023-05-03 MED ORDER — NITROGLYCERIN 1 MG/10 ML FOR IR/CATH LAB
INTRA_ARTERIAL | Status: AC
  Filled 2023-05-03: qty 10

## 2023-05-03 MED ORDER — CLOPIDOGREL BISULFATE 75 MG PO TABS: 75.0000 mg | ORAL_TABLET | Freq: Every day | ORAL | 6 refills | Status: DC

## 2023-05-03 MED ORDER — ASPIRIN 81 MG PO CHEW: 81.0000 mg | CHEWABLE_TABLET | ORAL | Status: DC

## 2023-05-03 MED ORDER — SODIUM CHLORIDE 0.9% FLUSH: 3.0000 mL | INTRAVENOUS | Status: DC | PRN

## 2023-05-03 MED ORDER — IOHEXOL 350 MG/ML SOLN
INTRAVENOUS | Status: DC | PRN
  Administered 2023-05-03: 105 mL via INTRA_ARTERIAL

## 2023-05-03 MED ORDER — ONDANSETRON HCL 4 MG/2ML IJ SOLN: 4.0000 mg | Freq: Four times a day (QID) | INTRAMUSCULAR | Status: DC | PRN

## 2023-05-03 MED ORDER — SODIUM CHLORIDE 0.9 % WEIGHT BASED INFUSION: 1.0000 mL/kg/h | INTRAVENOUS | Status: DC

## 2023-05-03 MED ORDER — SODIUM CHLORIDE 0.9 % WEIGHT BASED INFUSION
3.0000 mL/kg/h | INTRAVENOUS | Status: AC
  Administered 2023-05-03: 3 mL/kg/h via INTRAVENOUS

## 2023-05-03 SURGICAL SUPPLY — 33 items
BALLN ADMIRAL INPACT 5X200 (BALLOONS) ×2
BALLN STERLING OTW 4X100X135 (BALLOONS) ×2
BALLOON ADMIRAL INPACT 5X200 (BALLOONS) IMPLANT
BALLOON STERLING OTW 4X100X135 (BALLOONS) IMPLANT
BNDG HMST LF TOP THROMBIX (HEMOSTASIS) ×2
CATH ANGIO 5F PIGTAIL 65CM (CATHETERS) IMPLANT
CATH CROSS OVER TEMPO 5F (CATHETERS) IMPLANT
CATH HAWKONE LS STANDARD TIP (CATHETERS) ×2
CATH HAWKONE LS STD TIP (CATHETERS) IMPLANT
CATH NAVICROSS ANGLED 135CM (MICROCATHETER) IMPLANT
CATH SOFT-VU 4F 65 STRAIGHT (CATHETERS) IMPLANT
CATH SOFT-VU STRAIGHT 4F 65CM (CATHETERS) ×2
CATH STRAIGHT 5FR 65CM (CATHETERS) IMPLANT
CATH SYNTRAX .014X135 (CATHETERS) IMPLANT
DEVICE CLOSURE MYNXGRIP 6/7F (Vascular Products) IMPLANT
DEVICE SPIDERFX EMB PROT 6MM (WIRE) IMPLANT
KIT ENCORE 26 ADVANTAGE (KITS) IMPLANT
KIT MICROPUNCTURE NIT STIFF (SHEATH) IMPLANT
KIT PV (KITS) ×2 IMPLANT
PATCH THROMBIX TOPICAL PLAIN (HEMOSTASIS) IMPLANT
SHEATH CATAPULT 7FR 45 (SHEATH) IMPLANT
SHEATH PINNACLE 5F 10CM (SHEATH) IMPLANT
SHEATH PINNACLE 7F 10CM (SHEATH) IMPLANT
SHEATH PROBE COVER 6X72 (BAG) IMPLANT
SYR MEDRAD MARK 7 150ML (SYRINGE) ×2 IMPLANT
TAPE SHOOT N SEE (TAPE) IMPLANT
TRANSDUCER W/STOPCOCK (MISCELLANEOUS) ×2 IMPLANT
TRAY PV CATH (CUSTOM PROCEDURE TRAY) ×2 IMPLANT
TUBING CIL FLEX 10 FLL-RA (TUBING) IMPLANT
TUBING CONTRAST HIGH PRESS 48 (TUBING) IMPLANT
WIRE HITORQ VERSACORE ST 145CM (WIRE) IMPLANT
WIRE SHEPHERD 6G .014 (WIRE) IMPLANT
WIRE VERSACORE LOC 115CM (WIRE) IMPLANT

## 2023-05-03 NOTE — Interval H&P Note (Signed)
History and Physical Interval Note:  05/03/2023 12:32 PM  Jasmine Davidson  has presented today for surgery, with the diagnosis of PAD.  The various methods of treatment have been discussed with the patient and family. After consideration of risks, benefits and other options for treatment, the patient has consented to  Procedure(s): ABDOMINAL AORTOGRAM W/LOWER EXTREMITY (N/A) as a surgical intervention.  The patient's history has been reviewed, patient examined, no change in status, stable for surgery.  I have reviewed the patient's chart and labs.  Questions were answered to the patient's satisfaction.     Lorine Bears

## 2023-05-03 NOTE — Progress Notes (Signed)
Patient bled after Mynx deployment after arriving in Short Stay. Manual pressure held for 10 minutes on left femoral artery. Thrombix pad was used and left in place under dressing. The Thrombix pad will be removed before discharge.

## 2023-05-04 ENCOUNTER — Encounter (HOSPITAL_COMMUNITY): Payer: Self-pay | Admitting: Cardiovascular Disease

## 2023-05-04 MED FILL — Nitroglycerin IV Soln 100 MCG/ML in D5W: INTRA_ARTERIAL | Qty: 10 | Status: AC

## 2023-05-04 MED FILL — Lidocaine Inj 1% w/ Epinephrine-1:100000: INTRAMUSCULAR | Qty: 20 | Status: AC

## 2023-05-04 NOTE — Progress Notes (Signed)
The patient had Mynx grip device for left femoral hemostasis after the procedure.  Oozing from the arterial track was noted which continued after arrival to short stay. Thus, I injected the site with lidocaine with epinephrine and then applied a Thrombix pad.  There was no evidence of hematoma.

## 2023-05-31 ENCOUNTER — Ambulatory Visit: Payer: Medicare HMO | Admitting: Nurse Practitioner

## 2023-06-05 ENCOUNTER — Ambulatory Visit: Payer: Medicare HMO | Attending: Cardiovascular Disease

## 2023-06-05 DIAGNOSIS — I739 Peripheral vascular disease, unspecified: Secondary | ICD-10-CM

## 2023-06-05 LAB — VAS US ABI WITH/WO TBI: Right ABI: 1.05

## 2023-06-07 LAB — VAS US ABI WITH/WO TBI: Left ABI: 0.63

## 2023-06-12 ENCOUNTER — Other Ambulatory Visit: Payer: Self-pay | Admitting: *Deleted

## 2023-06-12 DIAGNOSIS — I739 Peripheral vascular disease, unspecified: Secondary | ICD-10-CM

## 2023-06-21 ENCOUNTER — Encounter: Payer: Self-pay | Admitting: Nurse Practitioner

## 2023-06-21 ENCOUNTER — Ambulatory Visit: Payer: Medicare HMO | Attending: Nurse Practitioner | Admitting: Nurse Practitioner

## 2023-06-21 VITALS — BP 138/60 | HR 77 | Ht 66.0 in | Wt 198.2 lb

## 2023-06-21 DIAGNOSIS — I2510 Atherosclerotic heart disease of native coronary artery without angina pectoris: Secondary | ICD-10-CM | POA: Diagnosis not present

## 2023-06-21 DIAGNOSIS — I739 Peripheral vascular disease, unspecified: Secondary | ICD-10-CM | POA: Diagnosis not present

## 2023-06-21 DIAGNOSIS — E785 Hyperlipidemia, unspecified: Secondary | ICD-10-CM

## 2023-06-21 DIAGNOSIS — I10 Essential (primary) hypertension: Secondary | ICD-10-CM | POA: Diagnosis not present

## 2023-06-21 DIAGNOSIS — Z794 Long term (current) use of insulin: Secondary | ICD-10-CM

## 2023-06-21 DIAGNOSIS — E118 Type 2 diabetes mellitus with unspecified complications: Secondary | ICD-10-CM

## 2023-06-21 DIAGNOSIS — R0989 Other specified symptoms and signs involving the circulatory and respiratory systems: Secondary | ICD-10-CM

## 2023-06-21 DIAGNOSIS — N1831 Chronic kidney disease, stage 3a: Secondary | ICD-10-CM

## 2023-06-21 MED ORDER — CEPHALEXIN 500 MG PO CAPS: 500.0000 mg | ORAL_CAPSULE | Freq: Two times a day (BID) | ORAL | 0 refills | Status: DC

## 2023-06-21 NOTE — Patient Instructions (Addendum)
Medication Instructions:  START Keflex 500 mg twice daily for 7 days  *If you need a refill on your cardiac medications before your next appointment, please call your pharmacy*   Lab Work: None ordered If you have labs (blood work) drawn today and your tests are completely normal, you will receive your results only by: MyChart Message (if you have MyChart) OR A paper copy in the mail If you have any lab test that is abnormal or we need to change your treatment, we will call you to review the results.   Testing/Procedures: Your physician has requested that you have a lower extremity arterial duplex in January. During this test, ultrasound is used to evaluate arterial blood flow in the legs. Allow one hour for this exam. There are no restrictions or special instructions. This will take place at [address] ([facility] [address], [place] [address]  Your physician has requested that you have an ankle brachial index (ABI) in January. During this test an ultrasound and blood pressure cuff are used to evaluate the arteries that supply the arms and legs with blood.  Allow thirty minutes for this exam.  There are no restrictions or special instructions.  This will take place at [address] ([facility], [place] [address]    Follow-Up: At [facility], you and your health needs are our priority.  As part of our continuing mission to provide you with exceptional heart care, we have created designated Provider Care Teams.  These Care Teams include your primary Cardiologist (physician) and Advanced Practice Providers (APPs -  Physician Assistants and Nurse Practitioners) who all work together to provide you with the care you need, when you need it.  We recommend signing up for the patient portal called "MyChart".  Sign up information is provided on this After Visit Summary.  MyChart is used to connect with patients for Virtual Visits (Telemedicine).   Patients are able to view lab/test results, encounter notes, upcoming appointments, etc.  Non-urgent messages can be sent to your provider as well.   To learn more about what you can do with MyChart, go to [email or web address].    Your next appointment:   6 month(s)  Provider:   Lorine Bears, MD

## 2023-06-21 NOTE — Progress Notes (Signed)
Office Visit    Patient Name: Jasmine Davidson Date of Encounter: 06/21/2023  Primary Care Provider:  Preston Fleeting, MD Primary Cardiologist:  Julien Nordmann, MD  Chief Complaint    67 y.o. y/o female with a history of nonobstructive CAD, hypertension, hyperlipidemia, diabetes, vasovagal syncope, PSVT, nonsustained VT, diabetic retinopathy and neuropathy and stage III chronic kidney disease, and peripheral arterial disease, who presents for follow-up after recent right lower extremity extremity PTA.   Past Medical History    Past Medical History:  Diagnosis Date   CKD (chronic kidney disease) stage 3, GFR 30-59 ml/min (HCC)    Diabetes mellitus    Diastolic dysfunction    a. 07/2020 Echo: EF 65-70%, no rwma, GrI DD, nl RV fxn, mod dil LA.   HTN (hypertension)    Hyperlipidemia    Non-obstructive CAD (coronary artery disease)    a. 07/2020 Cath: LM nl, LAD 5m, RI small-mild diff dzs, LCX nl, RCA min irregs.   Obesity    PAD (peripheral artery disease) (HCC)    a. 09/2022 LE Angio/PTA: Heavily Ca2+ Abd Ao and iliacs. LCIA 30, LSFA 70p (atherectomy/DBA), L Pop 100 above-knee w/ reconstitution via collats above knee joint and 3V runoff; b. 04/2023 PTA/Angio: Sev Ca2+ dzs dist RSFA extending into prox R popliteal (45-1mm syst gradient)-->directional atherectomy and DCBA.   Past Surgical History:  Procedure Laterality Date   ABDOMINAL AORTOGRAM W/LOWER EXTREMITY N/A 09/14/2022   Procedure: ABDOMINAL AORTOGRAM W/LOWER EXTREMITY;  Surgeon: Iran Ouch, MD;  Location: [facility];  Service: Cardiovascular;  Laterality: N/A;   ABDOMINAL AORTOGRAM W/LOWER EXTREMITY N/A 05/03/2023   Procedure: ABDOMINAL AORTOGRAM W/LOWER EXTREMITY;  Surgeon: Iran Ouch, MD;  Location: [facility];  Service: Cardiovascular;  Laterality: N/A;   BLADDER REPAIR W/ CESAREAN SECTION  1981   BREAST BIOPSY Right 12/17/2014   negative stereotactic   CARDIAC CATHETERIZATION      COLONOSCOPY WITH PROPOFOL N/A 03/30/2021   Procedure: COLONOSCOPY WITH PROPOFOL;  Surgeon: Regis Bill, MD;  Location: [facility];  Service: Endoscopy;  Laterality: N/A;  DM   cyst taken off finger  2011   ESOPHAGOGASTRODUODENOSCOPY (EGD) WITH PROPOFOL N/A 03/30/2021   Procedure: ESOPHAGOGASTRODUODENOSCOPY (EGD) WITH PROPOFOL;  Surgeon: Regis Bill, MD;  Location: [facility];  Service: Endoscopy;  Laterality: N/A;   LEFT HEART CATH AND CORONARY ANGIOGRAPHY N/A 07/21/2020   Procedure: LEFT HEART CATH AND CORONARY ANGIOGRAPHY;  Surgeon: Yvonne Kendall, MD;  Location: [facility];  Service: Cardiovascular;  Laterality: N/A;   PERIPHERAL VASCULAR ATHERECTOMY Left 09/14/2022   Procedure: PERIPHERAL VASCULAR ATHERECTOMY;  Surgeon: Iran Ouch, MD;  Location: [facility];  Service: Cardiovascular;  Laterality: Left;  SFA   PERIPHERAL VASCULAR BALLOON ANGIOPLASTY Left 09/14/2022   Procedure: PERIPHERAL VASCULAR BALLOON ANGIOPLASTY;  Surgeon: Iran Ouch, MD;  Location: [facility];  Service: Cardiovascular;  Laterality: Left;  SFA   PERIPHERAL VASCULAR BALLOON ANGIOPLASTY  05/03/2023   Procedure: PERIPHERAL VASCULAR BALLOON ANGIOPLASTY;  Surgeon: Iran Ouch, MD;  Location: [facility];  Service: Cardiovascular;;   TOE SURGERY  02/08/2013    Allergies  Allergies  Allergen Reactions   Pregabalin Other (See Comments)    Could not function  "Makes me feel drugged up"    Could not function    History of Present Illness      67 y.o. y/o female with above past medical history including nonobstructive CAD, hypertension,  hyperlipidemia, diabetes, stage III chronic kidney disease, vasovagal syncope, PSVT, nonsustained VT, diabetic retinopathy and neuropathy, and peripheral arterial disease. She was previously followed by vascular surgery at [facility] for chronic venous insufficiency and is status post GSV stripping and left GSV venous  ablation. 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 She underwent diagnostic catheterization in August 2021, in the setting of syncope, chest pain, and mild troponin elevation. This showed mild, nonobstructive mid LAD disease, and she was medically managed. Echo at that time showed an EF of 65 to 70% with grade 1 diastolic dysfunction.   In September 2022, she was evaluated for left calf claudication with an ABI of 0.52 on the left. Vascular ultrasound suggested significant disease versus short occlusion in the distal SFA/proximal popliteal artery. Repeat ABI in September of this 2023, was stable at 0.59 on the left and normal on the right. In the setting of progressive claudication, she underwent lower extremity arterial angiography in October 2023, revealing heavily calcified abdominal aortic and iliac disease with 70% stenosis in the left SFA and an occluded left popliteal with reconstitution via collaterals above the knee joint and three-vessel runoff. The left SFA was successfully treated with drug-coated balloon angioplasty.   She was seen in May 2024 after recent ABI showed a slight drop on the right at 0.87 with stable ABI on the left (0.67).  Duplex showed moderate iliac disease on the right with moderate right SFA disease and at least two-vessel runoff below the knee.  On the left, the proximal SFA was patent with normal velocities in the proximal left popliteal artery was chronically occluded.  At office visit on May 21, she reported a poorly healing ulceration of the right great toe over the preceding month, after stubbing it.  She subsequently underwent peripheral angiography which showed severely calcified disease in the distal right SFA extension the right proximal popliteal with a 45 to 50 mm gradient.  This was successfully treated with directional atherectomy and drug-coated balloon angioplasty.  Follow-up ABIs in June 2024  showed improvement on the right and 1.05 stable on the left and 0.63.   Since her PTA, she notes that her legs feel well and that the ulceration on her right great toe has more or less completely healed up.  Over the past few weeks however, she has noted significant erythema to the posterior medial portion of her right ankle, associated with tenderness.  She notes that prior to development of this erythema, she had a blistered area proximal to the current area of erythema, that resolved with the first round of antibiotics.  She denies any fevers, chills, or drainage related to the erythematous area on her right ankle.  She denies chest pain, dyspnea, palpitations, PND, orthopnea, dizziness, syncope, early satiety, or claudication.  Home Medications    Current Outpatient Medications  Medication Sig Dispense Refill   acetaminophen (TYLENOL) 500 MG tablet Take 1,000 mg by mouth every 6 (six) hours as needed for moderate pain.     aspirin (ASPIRIN 81) 81 MG EC tablet Take 81 mg by mouth daily.     atorvastatin (LIPITOR) 40 MG tablet TAKE ONE TABLET BY MOUTH EVERY DAY 30 tablet 11   butalbital-acetaminophen-caffeine (FIORICET) 50-325-40 MG tablet Take 2 tablets by mouth every 6 (six) hours as needed for headache or migraine. 30 tablet 0   Calcium Carb-Cholecalciferol (CALCIUM 600 + D PO) Take 2 tablets by mouth daily.  Carboxymethylcellul-Glycerin (REFRESH RELIEVA OP) Place 1 drop into both eyes every other day.     carvedilol (COREG) 12.5 MG tablet TAKE 1 TABLET BY MOUTH 2 TIMES A DAY WITH A MEAL 180 tablet 2   cetirizine (ZYRTEC) 10 MG tablet Take 10 mg by mouth daily.     Cholecalciferol (EQL VITAMIN D3) 25 MCG (1000 UT) capsule Take 1,000 Units by mouth daily.     clopidogrel (PLAVIX) 75 MG tablet Take 1 tablet (75 mg total) by mouth daily. 30 tablet 6   empagliflozin (JARDIANCE) 25 MG TABS tablet Take 25 mg by mouth daily.     Ferrous Sulfate (IRON) 325 (65 Fe) MG TABS Take 325 mg by mouth  daily.     fluticasone (FLONASE) 50 MCG/ACT nasal spray Place 1 spray into both nostrils at bedtime.     furosemide (LASIX) 20 MG tablet Take 1 tablet (20 mg total) by mouth daily as needed. 90 tablet 2   gabapentin (NEURONTIN) 300 MG capsule Take 300 mg by mouth 2 (two) times daily.      liraglutide (VICTOZA) 18 MG/3ML SOPN Inject 1.2 mg into the skin daily.     lisinopril (ZESTRIL) 10 MG tablet Take 1 tablet (10 mg total) by mouth daily. 90 tablet 2   metFORMIN (GLUCOPHAGE) 1000 MG tablet Take 1,000 mg by mouth 2 (two) times daily.     Neomycin-Bacitracin-Polymyxin (TRIPLE ANTIBIOTIC) OINT Apply 1 Application topically daily as needed (wound care).     venlafaxine (EFFEXOR) 37.5 MG tablet Take 37.5 mg by mouth daily.     vitamin B-12 (CYANOCOBALAMIN) 1000 MCG tablet Take 1,000 mcg by mouth daily.     No current facility-administered medications for this visit.     Review of Systems    Right ankle erythema and tenderness.  She denies claudication, chest pain, dyspnea, palpitations, PND, orthopnea, dizziness, syncope, or early satiety.  All other systems reviewed and are otherwise negative except as noted above.    Physical Exam    VS:  BP 138/60 (BP Location: Left Arm, Patient Position: Sitting, Cuff Size: Normal)   Pulse 77   Ht 5\' 6"  (1.676 m)   Wt 198 lb 3.2 oz (89.9 kg)   LMP 09/12/1999   SpO2 98%   BMI 31.99 kg/m  , BMI Body mass index is 31.99 kg/m.     GEN: Well nourished, well developed, in no acute distress. HEENT: normal. Neck: Supple, no JVD, carotid bruits, or masses. Cardiac: RRR, no murmurs, rubs, or gallops. No clubbing, cyanosis, edema.  Radials 2+/PT 2+ and equal bilaterally.  The right posterior medial ankle is erythematous, warm and tender to touch without ulceration or drainage. Respiratory:  Respirations regular and unlabored, clear to auscultation bilaterally. GI: Soft, nontender, nondistended, BS + x 4. MS: no deformity or atrophy. Skin: warm and dry, no  rash. Neuro:  Strength and sensation are intact. Psych: Normal affect.  Accessory Clinical Findings    Lab Results  Component Value Date   WBC 7.4 05/02/2023   HGB 12.7 05/02/2023   HCT 39.0 05/02/2023   MCV 95.8 05/02/2023   PLT 157 05/02/2023   Lab Results  Component Value Date   CREATININE 1.18 (H) 05/02/2023   BUN 19 05/02/2023   NA 137 05/02/2023   K 4.2 05/02/2023   CL 101 05/02/2023   CO2 27 05/02/2023   Lab Results  Component Value Date   ALT 15 12/16/2022   AST 19 12/16/2022   ALKPHOS 66 12/16/2022  BILITOT 1.6 (H) 12/16/2022   Lab Results  Component Value Date   CHOL 108 07/20/2020   HDL 28 (L) 07/20/2020   LDLCALC 62 07/20/2020   TRIG 88 07/20/2020   CHOLHDL 3.9 07/20/2020    Lab Results  Component Value Date   HGBA1C 5.8 (H) 07/18/2020    Assessment & Plan    1.  Peripheral arterial disease: Status post prior left lower extremity intervention in September 2023 and more recent right lower extremity atherectomy and drug-coated balloon angioplasty for severe distal right SFA disease extending into the right proximal popliteal artery.  Follow-up ABIs in June showed improvement on the right at 1.05 (was 0.87), and stable ABI on the left and 0.63.  She has not been having any claudication and notes that the ulceration on her right great toe is almost entirely healed at this point.  She has been having right posterior medial erythema, heat, and tenderness over the past week or 2, and I am treating her with cephalexin cellulitis.  She has follow-up with primary care next week.  Plan for follow-up ABIs in about 5 months.  She will remain on aspirin and Plavix for 3 months and then aspirin only.  Continue statin therapy.  2.  Right lower extremity cellulitis: As above, over the past week or 2, she started having erythema and tenderness over the right posteromedial lower leg/ankle.  The area is red and warm to touch on examination without drainage or skin breakdown.   She was previously treated with amoxicillin for cellulitis proximal to this area.  I have provided a prescription for cephalexin 500 mg twice daily for the next 7 days.  She has follow-up with her primary care provider next Monday to reevaluate.  3.  Essential hypertension: Stable on beta-blocker and ACE inhibitor therapy.  4.  Hyperlipidemia: Remains on atorvastatin therapy.  We do not have lipids on file to since 2021.  LFTs looked okay in January of this year.  Appears that lipids historically managed by primary care provider though I do not see any recent labs.  This will need to be reevaluated at a time when she is fasting.  5.  Type 2 diabetes mellitus: On Jardiance, victoza, and metformin with an A1c of 6.4 in January of this year.  Managed by primary care.  6.  Carotid arterial disease/left carotid bruit: 1 to 39% bilateral internal carotid artery stenoses by ultrasound in October 2023.  She remains on statin and antiplatelet therapy with no plan for follow-up imaging.  7.  Stage III chronic kidney disease: Creatinine stable at 1.18 in May.  She remains on ACE inhibitor therapy.  8.  Nonobstructive CAD: Status post non-STEMI in August 2021 with catheterization at that time showing a 45% mid LAD stenosis otherwise minor irregularities.  No chest pain or dyspnea.  Remains on aspirin, statin, beta-blocker, and ACE inhibitor therapy.    9.  Disposition:  pt to f/u w/ PCP next week re: cellulitis.  F/u w/ Dr. Kirke Corin as planned in ~ 5 mos w/ ABI's preceding visit.  Informed Consent    Nicolasa Ducking, NP 06/21/2023, 2:54 PM

## 2023-07-06 ENCOUNTER — Encounter: Payer: Self-pay | Admitting: Nurse Practitioner

## 2023-07-07 ENCOUNTER — Encounter: Payer: Self-pay | Admitting: Cardiovascular Disease

## 2023-09-22 ENCOUNTER — Telehealth: Payer: Self-pay | Admitting: Cardiovascular Disease

## 2023-09-22 DIAGNOSIS — L97519 Non-pressure chronic ulcer of other part of right foot with unspecified severity: Secondary | ICD-10-CM

## 2023-09-22 DIAGNOSIS — I739 Peripheral vascular disease, unspecified: Secondary | ICD-10-CM

## 2023-09-22 NOTE — Telephone Encounter (Signed)
Patient states she had a ulcer on her big toe and recently saw podiatrist, Linus Galas, DPM, who recommended following up with Dr. Kirke Corin because her pulse was taken and it was faint. She also mentions calcification.

## 2023-09-22 NOTE — Telephone Encounter (Signed)
Please order ABI and lower extremity arterial duplex, ideally around the time of her appointment, if possible.

## 2023-09-22 NOTE — Addendum Note (Signed)
Addended by: Sandi Mariscal on: 09/22/2023 02:18 PM   Modules accepted: Orders

## 2023-09-22 NOTE — Telephone Encounter (Signed)
Returned the call to the patient. Her older ulcer has healed that she had prior to her procedure in May but now she has a new ulcer that is nonhealing. Per the patient, the podiatrist feels like her pulse may be faint on the right foot. The patient denies any discoloration and temperature change.  Appointment made with APP on 10/15.    Per Podiatry office visit: Plan: Debrided devitalized tissue from the ulcerative area on the right hallux sharply using a 15 blade and tissue nippers including the epidermal and dermal layers. Full-thickness area centrally was fairly fibrotic and dry and not amenable to debridement. Prescription sent in for Augmentin for the cellulitis in her hallux. Discussed with the patient that my main concern is that she may have had some regression in her vascular status and intervention. Also discussed that she does obviously have some small vessel disease with the calcifications noted on x-ray down into the digits. Referral placed for Dr. Kirke Corin to reevaluate her circulation. Bacitracin and gauze dressing applied to the great toe. Continue with her current local wound care. Again strongly instructed the patient on offloading of the toe with pressure only on the heel. Recommended that she go into a surgical shoe that she has at home. Discussed that if the ulceration does continue to worsen and get deeper that she could be at risk for amputation of the toe. Patient will return to clinic in 3 weeks for follow-up.

## 2023-09-26 ENCOUNTER — Ambulatory Visit: Payer: Medicare HMO | Attending: Physician Assistant | Admitting: Physician Assistant

## 2023-09-26 ENCOUNTER — Encounter: Payer: Self-pay | Admitting: Physician Assistant

## 2023-09-26 VITALS — BP 152/76 | HR 65 | Ht 66.0 in | Wt 197.6 lb

## 2023-09-26 DIAGNOSIS — E785 Hyperlipidemia, unspecified: Secondary | ICD-10-CM

## 2023-09-26 DIAGNOSIS — I10 Essential (primary) hypertension: Secondary | ICD-10-CM | POA: Diagnosis not present

## 2023-09-26 DIAGNOSIS — I739 Peripheral vascular disease, unspecified: Secondary | ICD-10-CM | POA: Diagnosis not present

## 2023-09-26 DIAGNOSIS — L97519 Non-pressure chronic ulcer of other part of right foot with unspecified severity: Secondary | ICD-10-CM | POA: Diagnosis not present

## 2023-09-26 DIAGNOSIS — I2510 Atherosclerotic heart disease of native coronary artery without angina pectoris: Secondary | ICD-10-CM | POA: Diagnosis not present

## 2023-09-26 NOTE — Patient Instructions (Signed)
Medication Instructions:  Your Physician recommend you continue on your current medication as directed.    *If you need a refill on your cardiac medications before your next appointment, please call your pharmacy*  Follow-Up: At [facility], you and your health needs are our priority.  As part of our continuing mission to provide you with exceptional heart care, we have created designated Provider Care Teams.  These Care Teams include your primary Cardiologist (physician) and Advanced Practice Providers (APPs -  Physician Assistants and Nurse Practitioners) who all work together to provide you with the care you need, when you need it.  We recommend signing up for the patient portal called "MyChart".  Sign up information is provided on this After Visit Summary.  MyChart is used to connect with patients for Virtual Visits (Telemedicine).  Patients are able to view lab/test results, encounter notes, upcoming appointments, etc.  Non-urgent messages can be sent to your provider as well.   To learn more about what you can do with MyChart, go to [email or web address].    Your next appointment:   1 to 2 week(s) after your 10/10/2023 ultrasound appointments ABIs and LE arterial   Provider:   Lorine Bears, MD AND Eula Listen, PA-C

## 2023-09-26 NOTE — Progress Notes (Signed)
__________  ABI 06/01/2023:   LOWER EXTREMITY DOPPLER STUDY  Patient Name:  Jasmine Davidson  Date of Exam:   06/05/2023 Medical Rec #: [id number]        Accession #:    [id number] Date of Birth: July 25, 1956        Patient Gender: F Patient Age:   67 years Exam Location:  [facility] Procedure:      VAS [place] ABI WITH/WO TBI Referring Phys: Iran Ouch MD   --------------------------------------------------------------------------- -----   Indications: Claudication, ulceration, and peripheral artery disease. 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  Other Factors: Today patient c/o pain in her left calf after walking 50 yds.                Resolves with rest.                  Left popliteal artery occlusion.  Vascular Interventions: 05/03/23 right distal SFA into proximal popliteal artery                         atherectomy and balloon.  Comparison Study: Doppler on 04/25/23  reported a drop in ABI on the left. AGM on                   05/03/23 showed stenosis of the right distal SFA into the                   proximal popliteal artery  Performing Technologist: Quentin Ore RVT    Examination Guidelines: A complete evaluation includes at minimum, Doppler waveform signals and systolic blood pressure reading at the level of bilateral brachial, anterior tibial, and posterior tibial arteries, when vessel segments are accessible. Bilateral testing is considered an integral part of a complete examination. Photoelectric Plethysmograph (PPG) waveforms and toe systolic pressure readings are included as required and additional duplex testing as needed. Limited examinations for reoccurring indications may be performed as noted.    ABI Findings: +---------+------------------+-----+--------+--------------------------+ Right    Rt Pressure (mmHg)IndexWaveformComment                    +---------+------------------+-----+--------+--------------------------+ Brachial 147                                                       +---------+------------------+-----+--------+--------------------------+ PTA      143               0.97  __________  ABI 06/01/2023:   LOWER EXTREMITY DOPPLER STUDY  Patient Name:  Jasmine Davidson  Date of Exam:   06/05/2023 Medical Rec #: [id number]        Accession #:    [id number] Date of Birth: July 25, 1956        Patient Gender: F Patient Age:   67 years Exam Location:  [facility] Procedure:      VAS [place] ABI WITH/WO TBI Referring Phys: Iran Ouch MD   --------------------------------------------------------------------------- -----   Indications: Claudication, ulceration, and peripheral artery disease. 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  Other Factors: Today patient c/o pain in her left calf after walking 50 yds.                Resolves with rest.                  Left popliteal artery occlusion.  Vascular Interventions: 05/03/23 right distal SFA into proximal popliteal artery                         atherectomy and balloon.  Comparison Study: Doppler on 04/25/23  reported a drop in ABI on the left. AGM on                   05/03/23 showed stenosis of the right distal SFA into the                   proximal popliteal artery  Performing Technologist: Quentin Ore RVT    Examination Guidelines: A complete evaluation includes at minimum, Doppler waveform signals and systolic blood pressure reading at the level of bilateral brachial, anterior tibial, and posterior tibial arteries, when vessel segments are accessible. Bilateral testing is considered an integral part of a complete examination. Photoelectric Plethysmograph (PPG) waveforms and toe systolic pressure readings are included as required and additional duplex testing as needed. Limited examinations for reoccurring indications may be performed as noted.    ABI Findings: +---------+------------------+-----+--------+--------------------------+ Right    Rt Pressure (mmHg)IndexWaveformComment                    +---------+------------------+-----+--------+--------------------------+ Brachial 147                                                       +---------+------------------+-----+--------+--------------------------+ PTA      143               0.97  Cardiology Office Note    Date:  09/26/2023   ID:  FALISA LAMORA, DOB 1956-10-07, MRN [id number]  PCP:  Preston Fleeting, MD  Cardiologist:  Julien Nordmann, MD  Electrophysiologist:  None   Chief Complaint: Evaluation of right great toe ulceration   History of Present Illness:   Jasmine Davidson is a 67 y.o. female with history of nonobstructive CAD, PAD, DM2 with diabetic retinopathy and neuropathy, PSVT, NSVT, CKD stage III, HTN, HLD, and vasovagal syncope who presents for follow-up of PAD.  She was previously followed by vascular surgery at [facility] for chronic venous insufficiency and is status post GSV stripping and left GSV venous ablation.  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  She underwent diagnostic catheterization in 07/2020, in the setting of syncope, chest pain, and mild troponin elevation.  This showed mild, nonobstructive mid LAD disease, and she was medically managed.  Echo at that time showed an EF of 65 to 70% with grade 1 diastolic dysfunction.   In 08/2021, she was evaluated for left calf claudication with an ABI of 0.52 on the left.  Vascular ultrasound suggested significant disease versus short occlusion in the distal SFA/proximal popliteal artery.  Repeat ABI in 08/2022, was stable at 0.59 on the left and normal on the right.  In the setting of progressive claudication, she underwent lower extremity arterial angiography in 09/2022, revealing heavily calcified abdominal aortic and iliac disease with 70% stenosis in the left SFA and an occluded left popliteal with reconstitution via collaterals above the knee joint and three-vessel runoff.  The left SFA was successfully treated with drug-coated balloon angioplasty.   She was seen in 04/2023 after ABI showed a slight drop on the right at 0.87 with stable ABI on the left (0.67).  Duplex showed moderate iliac disease on the right with moderate right SFA  disease and at least two-vessel runoff below the knee.  On the left, the proximal SFA was patent with normal velocities in the proximal left popliteal artery was chronically occluded.  At office visit on 05/02/2023, she reported a poorly healing ulceration of the right great toe over the preceding month, after stubbing it.  She subsequently underwent peripheral angiography which showed severely calcified disease in the distal right SFA extension the right proximal popliteal with a 45 to 50 mm gradient.  This was successfully treated with directional atherectomy and drug-coated balloon angioplasty.  Follow-up ABIs in 05/2023 showed improvement on the right and 1.05 stable on the left and 0.63.   She was most recently seen in the office in 06/2023, and noted the ulceration on the right great tow had mostly healed.  However, she did noted significant erythema to the posterior medial portion of her right ankle, associated with tenderness. She reported that prior to development of this erythema, she had a blistered area proximal to the current area of erythema, that resolved with the a round of antibiotics.  She was without symptoms of claudication.  She was treated for cellulitis with cephalexin.  More recently, she was evaluated by podiatry on 09/20/2023 with notation of erythema and edema in the right hallux along the medial and dorsal aspect.  There was continued ulceration noted on the plantar aspect of the hallux measuring approximately 12 mm x 9 mm predebridement and 14 mm x 10 mm postdebridement with depth of 3 to 4 mm down close to the tendinous structures.  Cardiology Office Note    Date:  09/26/2023   ID:  FALISA LAMORA, DOB 1956-10-07, MRN [id number]  PCP:  Preston Fleeting, MD  Cardiologist:  Julien Nordmann, MD  Electrophysiologist:  None   Chief Complaint: Evaluation of right great toe ulceration   History of Present Illness:   Jasmine Davidson is a 67 y.o. female with history of nonobstructive CAD, PAD, DM2 with diabetic retinopathy and neuropathy, PSVT, NSVT, CKD stage III, HTN, HLD, and vasovagal syncope who presents for follow-up of PAD.  She was previously followed by vascular surgery at [facility] for chronic venous insufficiency and is status post GSV stripping and left GSV venous ablation.  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  She underwent diagnostic catheterization in 07/2020, in the setting of syncope, chest pain, and mild troponin elevation.  This showed mild, nonobstructive mid LAD disease, and she was medically managed.  Echo at that time showed an EF of 65 to 70% with grade 1 diastolic dysfunction.   In 08/2021, she was evaluated for left calf claudication with an ABI of 0.52 on the left.  Vascular ultrasound suggested significant disease versus short occlusion in the distal SFA/proximal popliteal artery.  Repeat ABI in 08/2022, was stable at 0.59 on the left and normal on the right.  In the setting of progressive claudication, she underwent lower extremity arterial angiography in 09/2022, revealing heavily calcified abdominal aortic and iliac disease with 70% stenosis in the left SFA and an occluded left popliteal with reconstitution via collaterals above the knee joint and three-vessel runoff.  The left SFA was successfully treated with drug-coated balloon angioplasty.   She was seen in 04/2023 after ABI showed a slight drop on the right at 0.87 with stable ABI on the left (0.67).  Duplex showed moderate iliac disease on the right with moderate right SFA  disease and at least two-vessel runoff below the knee.  On the left, the proximal SFA was patent with normal velocities in the proximal left popliteal artery was chronically occluded.  At office visit on 05/02/2023, she reported a poorly healing ulceration of the right great toe over the preceding month, after stubbing it.  She subsequently underwent peripheral angiography which showed severely calcified disease in the distal right SFA extension the right proximal popliteal with a 45 to 50 mm gradient.  This was successfully treated with directional atherectomy and drug-coated balloon angioplasty.  Follow-up ABIs in 05/2023 showed improvement on the right and 1.05 stable on the left and 0.63.   She was most recently seen in the office in 06/2023, and noted the ulceration on the right great tow had mostly healed.  However, she did noted significant erythema to the posterior medial portion of her right ankle, associated with tenderness. She reported that prior to development of this erythema, she had a blistered area proximal to the current area of erythema, that resolved with the a round of antibiotics.  She was without symptoms of claudication.  She was treated for cellulitis with cephalexin.  More recently, she was evaluated by podiatry on 09/20/2023 with notation of erythema and edema in the right hallux along the medial and dorsal aspect.  There was continued ulceration noted on the plantar aspect of the hallux measuring approximately 12 mm x 9 mm predebridement and 14 mm x 10 mm postdebridement with depth of 3 to 4 mm down close to the tendinous structures.  Cardiology Office Note    Date:  09/26/2023   ID:  FALISA LAMORA, DOB 1956-10-07, MRN [id number]  PCP:  Preston Fleeting, MD  Cardiologist:  Julien Nordmann, MD  Electrophysiologist:  None   Chief Complaint: Evaluation of right great toe ulceration   History of Present Illness:   Jasmine Davidson is a 67 y.o. female with history of nonobstructive CAD, PAD, DM2 with diabetic retinopathy and neuropathy, PSVT, NSVT, CKD stage III, HTN, HLD, and vasovagal syncope who presents for follow-up of PAD.  She was previously followed by vascular surgery at [facility] for chronic venous insufficiency and is status post GSV stripping and left GSV venous ablation.  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  She underwent diagnostic catheterization in 07/2020, in the setting of syncope, chest pain, and mild troponin elevation.  This showed mild, nonobstructive mid LAD disease, and she was medically managed.  Echo at that time showed an EF of 65 to 70% with grade 1 diastolic dysfunction.   In 08/2021, she was evaluated for left calf claudication with an ABI of 0.52 on the left.  Vascular ultrasound suggested significant disease versus short occlusion in the distal SFA/proximal popliteal artery.  Repeat ABI in 08/2022, was stable at 0.59 on the left and normal on the right.  In the setting of progressive claudication, she underwent lower extremity arterial angiography in 09/2022, revealing heavily calcified abdominal aortic and iliac disease with 70% stenosis in the left SFA and an occluded left popliteal with reconstitution via collaterals above the knee joint and three-vessel runoff.  The left SFA was successfully treated with drug-coated balloon angioplasty.   She was seen in 04/2023 after ABI showed a slight drop on the right at 0.87 with stable ABI on the left (0.67).  Duplex showed moderate iliac disease on the right with moderate right SFA  disease and at least two-vessel runoff below the knee.  On the left, the proximal SFA was patent with normal velocities in the proximal left popliteal artery was chronically occluded.  At office visit on 05/02/2023, she reported a poorly healing ulceration of the right great toe over the preceding month, after stubbing it.  She subsequently underwent peripheral angiography which showed severely calcified disease in the distal right SFA extension the right proximal popliteal with a 45 to 50 mm gradient.  This was successfully treated with directional atherectomy and drug-coated balloon angioplasty.  Follow-up ABIs in 05/2023 showed improvement on the right and 1.05 stable on the left and 0.63.   She was most recently seen in the office in 06/2023, and noted the ulceration on the right great tow had mostly healed.  However, she did noted significant erythema to the posterior medial portion of her right ankle, associated with tenderness. She reported that prior to development of this erythema, she had a blistered area proximal to the current area of erythema, that resolved with the a round of antibiotics.  She was without symptoms of claudication.  She was treated for cellulitis with cephalexin.  More recently, she was evaluated by podiatry on 09/20/2023 with notation of erythema and edema in the right hallux along the medial and dorsal aspect.  There was continued ulceration noted on the plantar aspect of the hallux measuring approximately 12 mm x 9 mm predebridement and 14 mm x 10 mm postdebridement with depth of 3 to 4 mm down close to the tendinous structures.  __________  ABI 06/01/2023:   LOWER EXTREMITY DOPPLER STUDY  Patient Name:  Jasmine Davidson  Date of Exam:   06/05/2023 Medical Rec #: [id number]        Accession #:    [id number] Date of Birth: July 25, 1956        Patient Gender: F Patient Age:   67 years Exam Location:  [place] Procedure:      VAS [place] ABI WITH/WO TBI Referring Phys: Iran Ouch MD   --------------------------------------------------------------------------- -----   Indications: Claudication, ulceration, and peripheral artery disease. 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  Other Factors: Today patient c/o pain in her left calf after walking 50 yds.                Resolves with rest.                  Left popliteal artery occlusion.  Vascular Interventions: 05/03/23 right distal SFA into proximal popliteal artery                         atherectomy and balloon.  Comparison Study: Doppler on 04/25/23  reported a drop in ABI on the left. AGM on                   05/03/23 showed stenosis of the right distal SFA into the                   proximal popliteal artery  Performing Technologist: Quentin Ore RVT    Examination Guidelines: A complete evaluation includes at minimum, Doppler waveform signals and systolic blood pressure reading at the level of bilateral brachial, anterior tibial, and posterior tibial arteries, when vessel segments are accessible. Bilateral testing is considered an integral part of a complete examination. Photoelectric Plethysmograph (PPG) waveforms and toe systolic pressure readings are included as required and additional duplex testing as needed. Limited examinations for reoccurring indications may be performed as noted.    ABI Findings: +---------+------------------+-----+--------+--------------------------+ Right    Rt Pressure (mmHg)IndexWaveformComment                    +---------+------------------+-----+--------+--------------------------+ Brachial 147                                                       +---------+------------------+-----+--------+--------------------------+ PTA      143               0.97  Cardiology Office Note    Date:  09/26/2023   ID:  FALISA LAMORA, DOB 1956-10-07, MRN [id number]  PCP:  Preston Fleeting, MD  Cardiologist:  Julien Nordmann, MD  Electrophysiologist:  None   Chief Complaint: Evaluation of right great toe ulceration   History of Present Illness:   Jasmine Davidson is a 67 y.o. female with history of nonobstructive CAD, PAD, DM2 with diabetic retinopathy and neuropathy, PSVT, NSVT, CKD stage III, HTN, HLD, and vasovagal syncope who presents for follow-up of PAD.  She was previously followed by vascular surgery at [facility] for chronic venous insufficiency and is status post GSV stripping and left GSV venous ablation.  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  She underwent diagnostic catheterization in 07/2020, in the setting of syncope, chest pain, and mild troponin elevation.  This showed mild, nonobstructive mid LAD disease, and she was medically managed.  Echo at that time showed an EF of 65 to 70% with grade 1 diastolic dysfunction.   In 08/2021, she was evaluated for left calf claudication with an ABI of 0.52 on the left.  Vascular ultrasound suggested significant disease versus short occlusion in the distal SFA/proximal popliteal artery.  Repeat ABI in 08/2022, was stable at 0.59 on the left and normal on the right.  In the setting of progressive claudication, she underwent lower extremity arterial angiography in 09/2022, revealing heavily calcified abdominal aortic and iliac disease with 70% stenosis in the left SFA and an occluded left popliteal with reconstitution via collaterals above the knee joint and three-vessel runoff.  The left SFA was successfully treated with drug-coated balloon angioplasty.   She was seen in 04/2023 after ABI showed a slight drop on the right at 0.87 with stable ABI on the left (0.67).  Duplex showed moderate iliac disease on the right with moderate right SFA  disease and at least two-vessel runoff below the knee.  On the left, the proximal SFA was patent with normal velocities in the proximal left popliteal artery was chronically occluded.  At office visit on 05/02/2023, she reported a poorly healing ulceration of the right great toe over the preceding month, after stubbing it.  She subsequently underwent peripheral angiography which showed severely calcified disease in the distal right SFA extension the right proximal popliteal with a 45 to 50 mm gradient.  This was successfully treated with directional atherectomy and drug-coated balloon angioplasty.  Follow-up ABIs in 05/2023 showed improvement on the right and 1.05 stable on the left and 0.63.   She was most recently seen in the office in 06/2023, and noted the ulceration on the right great tow had mostly healed.  However, she did noted significant erythema to the posterior medial portion of her right ankle, associated with tenderness. She reported that prior to development of this erythema, she had a blistered area proximal to the current area of erythema, that resolved with the a round of antibiotics.  She was without symptoms of claudication.  She was treated for cellulitis with cephalexin.  More recently, she was evaluated by podiatry on 09/20/2023 with notation of erythema and edema in the right hallux along the medial and dorsal aspect.  There was continued ulceration noted on the plantar aspect of the hallux measuring approximately 12 mm x 9 mm predebridement and 14 mm x 10 mm postdebridement with depth of 3 to 4 mm down close to the tendinous structures.

## 2023-10-10 ENCOUNTER — Ambulatory Visit (INDEPENDENT_AMBULATORY_CARE_PROVIDER_SITE_OTHER): Payer: Medicare HMO

## 2023-10-10 ENCOUNTER — Ambulatory Visit: Payer: Medicare HMO | Attending: Physician Assistant

## 2023-10-10 DIAGNOSIS — L97519 Non-pressure chronic ulcer of other part of right foot with unspecified severity: Secondary | ICD-10-CM | POA: Diagnosis not present

## 2023-10-10 DIAGNOSIS — I739 Peripheral vascular disease, unspecified: Secondary | ICD-10-CM

## 2023-10-11 LAB — VAS US ABI WITH/WO TBI
Left ABI: 0.65
Right ABI: 1.17

## 2023-10-19 ENCOUNTER — Encounter: Payer: Self-pay | Admitting: Cardiovascular Disease

## 2023-10-19 ENCOUNTER — Ambulatory Visit: Payer: Medicare HMO | Attending: Cardiovascular Disease | Admitting: Cardiovascular Disease

## 2023-10-19 VITALS — BP 156/68 | HR 75 | Ht 66.0 in | Wt 199.6 lb

## 2023-10-19 DIAGNOSIS — I10 Essential (primary) hypertension: Secondary | ICD-10-CM

## 2023-10-19 DIAGNOSIS — E785 Hyperlipidemia, unspecified: Secondary | ICD-10-CM

## 2023-10-19 DIAGNOSIS — I2510 Atherosclerotic heart disease of native coronary artery without angina pectoris: Secondary | ICD-10-CM | POA: Diagnosis not present

## 2023-10-19 DIAGNOSIS — R0989 Other specified symptoms and signs involving the circulatory and respiratory systems: Secondary | ICD-10-CM

## 2023-10-19 DIAGNOSIS — I739 Peripheral vascular disease, unspecified: Secondary | ICD-10-CM | POA: Diagnosis not present

## 2023-10-19 NOTE — Patient Instructions (Signed)
Medication Instructions:  No changes *If you need a refill on your cardiac medications before your next appointment, please call your pharmacy*   Lab Work: None ordered If you have labs (blood work) drawn today and your tests are completely normal, you will receive your results only by: MyChart Message (if you have MyChart) OR A paper copy in the mail If you have any lab test that is abnormal or we need to change your treatment, we will call you to review the results.   Testing/Procedures: None ordered   Follow-Up: At [facility], you and your health needs are our priority.  As part of our continuing mission to provide you with exceptional heart care, we have created designated Provider Care Teams.  These Care Teams include your primary Cardiologist (physician) and Advanced Practice Providers (APPs -  Physician Assistants and Nurse Practitioners) who all work together to provide you with the care you need, when you need it.  We recommend signing up for the patient portal called "MyChart".  Sign up information is provided on this After Visit Summary.  MyChart is used to connect with patients for Virtual Visits (Telemedicine).  Patients are able to view lab/test results, encounter notes, upcoming appointments, etc.  Non-urgent messages can be sent to your provider as well.   To learn more about what you can do with MyChart, go to [email or web address].    Your next appointment:   4 month(s)  Provider:   Dr. Kirke Corin

## 2023-10-19 NOTE — Progress Notes (Signed)
Cardiology Office Note   Date:  10/19/2023   ID:  [name], DOB 12-27-1955, MRN [id number]  PCP:  Jasmine Fleeting, MD  Cardiologist: Dr. Mariah Davidson  Chief Complaint  Patient presents with   Follow-up    Discuss test results.  Patient denies new or acute cardiac problems/concerns today.        History of Present Illness: Jasmine Davidson is a 67 y.o. female who is here today for follow-up visit regarding peripheral arterial disease.   She has history of mild to moderate nonobstructive coronary artery disease, diabetes mellitus, essential hypertension, hyperlipidemia, stage III chronic kidney disease and peripheral arterial disease.  She has followed in the past at [facility] vascular surgery clinic for chronic venous insufficiency status post right GSV stripping and left GSV venous ablation.  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  She had worsening claudication in 2023.  Thus, I proceeded with angiography in October which showed no significant aortoiliac disease but the aorta and the iliac arteries were significantly calcified.  On the left side, there was heavily calcified stenosis in the proximal SFA, short occlusion of the proximal popliteal artery with reconstitution via extensive collaterals above the knee and three-vessel runoff below the knee.  I performed directional atherectomy and drug-coated balloon angioplasty to the proximal left SFA.  The popliteal artery occlusion was left to be treated medically.  She had nonhealing wound on the right big toe this year and thus she underwent peripheral angiography in May of this year which showed severe calcified stenosis in the distal SFA extending into the proximal popliteal artery with three-vessel runoff below the knee.  I performed successful directional atherectomy and drug-coated balloon angioplasty to the right distal SFA extending into the popliteal  artery. She continues to follow with podiatry for wound and cellulitis in the right foot.  We repeated her Doppler studies recently which showed normal ABI and toe pressure on the right side with patent SFA and popliteal arteries with no significant restenosis. The wound is improving and she denies claudication on the left side.   Past Medical History:  Diagnosis Date   CKD (chronic kidney disease) stage 3, GFR 30-59 ml/min (HCC)    Diabetes mellitus    Diastolic dysfunction    a. 07/2020 Echo: EF 65-70%, no rwma, GrI DD, nl RV fxn, mod dil LA.   HTN (hypertension)    Hyperlipidemia    Non-obstructive CAD (coronary artery disease)    a. 07/2020 Cath: LM nl, LAD 65m, RI small-mild diff dzs, LCX nl, RCA min irregs.   Obesity    PAD (peripheral artery disease) (HCC)    a. 09/2022 LE Angio/PTA: Heavily Ca2+ Abd Ao and iliacs. LCIA 30, LSFA 70p (atherectomy/DBA), L Pop 100 above-knee w/ reconstitution via collats above knee joint and 3V runoff; b. 04/2023 PTA/Angio: Sev Ca2+ dzs dist RSFA extending into prox R popliteal (45-48mm syst gradient)-->directional atherectomy and DCBA.    Past Surgical History:  Procedure Laterality Date   ABDOMINAL AORTOGRAM W/LOWER EXTREMITY N/A 09/14/2022   Procedure: ABDOMINAL AORTOGRAM W/LOWER EXTREMITY;  Surgeon: Jasmine Ouch, MD;  [facility];  Service: Cardiovascular;  Laterality: N/A;   ABDOMINAL AORTOGRAM W/LOWER EXTREMITY N/A 05/03/2023   Procedure: ABDOMINAL AORTOGRAM W/LOWER EXTREMITY;  Surgeon: Jasmine Ouch, MD;  Location: [facility];  Service: Cardiovascular;  Laterality: N/A;   BLADDER REPAIR W/ CESAREAN SECTION  1981   BREAST BIOPSY Right 12/17/2014   negative stereotactic   CARDIAC CATHETERIZATION     COLONOSCOPY WITH PROPOFOL N/A 03/30/2021   Procedure: COLONOSCOPY WITH PROPOFOL;  Surgeon: Jasmine Bill, MD;  Location: [facility];  Service: Endoscopy;  Laterality: N/A;  DM   cyst taken off finger  2011    ESOPHAGOGASTRODUODENOSCOPY (EGD) WITH PROPOFOL N/A 03/30/2021   Procedure: ESOPHAGOGASTRODUODENOSCOPY (EGD) WITH PROPOFOL;  Surgeon: Jasmine Bill, MD;  Location: [facility] ENDOS[facility];  Service: Endoscopy;  Laterality: N/A;   LEFT HEART CATH AND CORONARY ANGIOGRAPHY N/A 07/21/2020   Procedure: LEFT HEART CATH AND CORONARY ANGIOGRAPHY;  Surgeon: Jasmine Kendall, MD;  Location: [facility];  Service: Cardiovascular;  Laterality: N/A;   PERIPHERAL VASCULAR ATHERECTOMY Left 09/14/2022   Procedure: PERIPHERAL VASCULAR ATHERECTOMY;  Surgeon: Jasmine Ouch, MD;  [facility];  Service: Cardiovascular;  Laterality: Left;  SFA   PERIPHERAL VASCULAR BALLOON ANGIOPLASTY Left 09/14/2022   Procedure: PERIPHERAL VASCULAR BALLOON ANGIOPLASTY;  Surgeon: Jasmine Ouch, MD;  [facility];  Service: Cardiovascular;  Laterality: Left;  SFA   PERIPHERAL VASCULAR BALLOON ANGIOPLASTY  05/03/2023   Procedure: PERIPHERAL VASCULAR BALLOON ANGIOPLASTY;  Surgeon: Jasmine Ouch, MD;  [facility]: [facility];  Service: Cardiovascular;;   TOE SURGERY  02/08/2013     Current Outpatient Medications  Medication Sig Dispense Refill   acetaminophen (TYLENOL) 500 MG tablet Take 1,000 mg by mouth every 6 (six) hours as needed for moderate pain.     aspirin (ASPIRIN 81) 81 MG EC tablet Take 81 mg by mouth daily.     atorvastatin (LIPITOR) 40 MG tablet TAKE ONE TABLET BY MOUTH EVERY DAY 30 tablet 11   Calcium Carb-Cholecalciferol (CALCIUM 600 + D PO) Take 2 tablets by mouth daily.     Carboxymethylcellul-Glycerin (REFRESH RELIEVA OP) Place 1 drop into both eyes every other day.     carvedilol (COREG) 12.5 MG tablet TAKE 1 TABLET BY MOUTH 2 TIMES A DAY WITH A MEAL 180 tablet 2   cetirizine (ZYRTEC) 10 MG tablet Take 10 mg by mouth daily.     Cholecalciferol (EQL VITAMIN D3) 25 MCG (1000 UT) capsule Take 1,000 Units by mouth daily.     empagliflozin (JARDIANCE) 25 MG TABS tablet  Take 25 mg by mouth daily.     Ferrous Sulfate (IRON) 325 (65 Fe) MG TABS Take 325 mg by mouth daily.     fluticasone (FLONASE) 50 MCG/ACT nasal spray Place 1 spray into both nostrils at bedtime.     furosemide (LASIX) 20 MG tablet Take 1 tablet (20 mg total) by mouth daily as needed. 90 tablet 2   gabapentin (NEURONTIN) 300 MG capsule Take 300 mg by mouth 2 (two) times daily.      liraglutide (VICTOZA) 18 MG/3ML SOPN Inject 1.2 mg into the skin daily.     lisinopril (ZESTRIL) 10 MG tablet Take 1 tablet (10 mg total) by mouth daily. 90 tablet 2   loratadine (CLARITIN) 10 MG tablet Take by mouth.     metFORMIN (GLUCOPHAGE) 1000 MG tablet Take 1,000 mg by mouth 2 (two) times daily.     Neomycin-Bacitracin-Polymyxin (TRIPLE ANTIBIOTIC) OINT Apply 1 Application topically daily as needed (wound care).     venlafaxine (EFFEXOR) 37.5 MG tablet Take 37.5 mg by mouth daily.     vitamin B-12 (CYANOCOBALAMIN) 1000 MCG tablet Take 1,000 mcg by mouth daily.     amoxicillin-clavulanate (AUGMENTIN) 875-125 MG tablet  Take 1 tablet by mouth 2 (two) times daily. (Patient not taking: Reported on 10/19/2023)     No current facility-administered medications for this visit.    Allergies:   Pregabalin    Social History:  The patient  reports that she has never smoked. She has never used smokeless tobacco. She reports that she does not drink alcohol and does not use drugs.   Family History:  The patient's family history includes COPD in her mother; Diabetes in her father; Heart failure in her father; Hypertension in her father; Lupus in her father.    ROS:  Please see the history of present illness.   Otherwise, review of systems are positive for none.   All other systems are reviewed and negative.    PHYSICAL EXAM: VS:  BP (!) 156/68 (BP Location: Left Arm, Patient Position: Sitting, Cuff Size: Large)   Pulse 75   Ht 5\' 6"  (1.676 m)   Wt 199 lb 9.6 oz (90.5 kg)   LMP 09/12/1999   SpO2 99%   BMI 32.22  kg/m  , BMI Body mass index is 32.22 kg/m. GEN: Well nourished, well developed, in no acute distress  HEENT: normal  Neck: no JVD  or masses.  Left carotid bruit Cardiac: RRR; no murmurs, rubs, or gallops,no edema  Respiratory:  clear to auscultation bilaterally, normal work of breathing GI: soft, nontender, nondistended, + BS MS: no deformity or atrophy  Skin: warm and dry, no rash Neuro:  Strength and sensation are intact Psych: euthymic mood, full affect    EKG:  EKG is not ordered today.    Recent Labs: 12/16/2022: ALT 15 05/02/2023: BUN 19; Creatinine, Ser 1.18; Hemoglobin 12.7; Platelets 157; Potassium 4.2; Sodium 137    Lipid Panel    Component Value Date/Time   CHOL 108 07/20/2020 0434   TRIG 88 07/20/2020 0434   TRIG 49 11/16/2009 0000   HDL 28 (L) 07/20/2020 0434   CHOLHDL 3.9 07/20/2020 0434   VLDL 18 07/20/2020 0434   LDLCALC 62 07/20/2020 0434   LDLCALC 58 11/16/2009 0000      Wt Readings from Last 3 Encounters:  10/19/23 199 lb 9.6 oz (90.5 kg)  09/26/23 197 lb 9.6 oz (89.6 kg)  06/21/23 198 lb 3.2 oz (89.9 kg)           No data to display            ASSESSMENT AND PLAN:  1.  Peripheral arterial disease: Status post left SFA atherectomy and drug-coated balloon angioplasty with resolution of claudication on the left side.   She is status post atherectomy and drug-coated balloon angioplasty to the right SFA/popliteal arteries with excellent results and normalization of ABI and toe pressure.  She should have enough blood flow to heal the wound on the right foot.  Continue follow-up with podiatry.  She has chronically occluded left popliteal artery but is currently asymptomatic and there is no indication for revascularization on the left.    2.  Coronary artery disease involving native coronary arteries without angina: She reports no anginal symptoms.  Continue medical therapy.  3.  Hyperlipidemia: Continue atorvastatin 40 mg once daily with a  target LDL of less than 70.  Most recent labs showed an LDL of 63.  4.  Essential hypertension: Blood pressure is elevated here but she reports normal readings at home.  Continue to monitor for now.  5.  Left carotid bruit: Most recent carotid Doppler in October 2023 showed mild nonobstructive  disease bilaterally.    Disposition:  F/U in 4 months  Signed,  Lorine Bears, MD  10/19/2023 3:18 PM    [facility]

## 2023-10-30 ENCOUNTER — Other Ambulatory Visit: Payer: Self-pay | Admitting: Nurse Practitioner

## 2023-11-06 ENCOUNTER — Other Ambulatory Visit: Payer: Self-pay | Admitting: Nurse Practitioner

## 2023-11-14 ENCOUNTER — Other Ambulatory Visit: Payer: Self-pay | Admitting: Nurse Practitioner

## 2023-11-14 DIAGNOSIS — Z1231 Encounter for screening mammogram for malignant neoplasm of breast: Secondary | ICD-10-CM

## 2023-12-12 ENCOUNTER — Ambulatory Visit
Admission: RE | Admit: 2023-12-12 | Discharge: 2023-12-12 | Disposition: A | Discharge status: Home / Self Care | Payer: Medicare HMO | Source: Ambulatory Visit | Attending: Nurse Practitioner | Admitting: Nurse Practitioner

## 2023-12-12 DIAGNOSIS — Z1231 Encounter for screening mammogram for malignant neoplasm of breast: Secondary | ICD-10-CM | POA: Diagnosis present

## 2024-01-10 DIAGNOSIS — L97512 Non-pressure chronic ulcer of other part of right foot with fat layer exposed: Secondary | ICD-10-CM | POA: Diagnosis not present

## 2024-01-10 DIAGNOSIS — L97521 Non-pressure chronic ulcer of other part of left foot limited to breakdown of skin: Secondary | ICD-10-CM | POA: Diagnosis not present

## 2024-01-10 DIAGNOSIS — E1140 Type 2 diabetes mellitus with diabetic neuropathy, unspecified: Secondary | ICD-10-CM | POA: Diagnosis not present

## 2024-01-19 DIAGNOSIS — N1831 Chronic kidney disease, stage 3a: Secondary | ICD-10-CM | POA: Diagnosis not present

## 2024-01-19 DIAGNOSIS — E1142 Type 2 diabetes mellitus with diabetic polyneuropathy: Secondary | ICD-10-CM | POA: Diagnosis not present

## 2024-01-19 DIAGNOSIS — J302 Other seasonal allergic rhinitis: Secondary | ICD-10-CM | POA: Diagnosis not present

## 2024-01-19 DIAGNOSIS — I739 Peripheral vascular disease, unspecified: Secondary | ICD-10-CM | POA: Diagnosis not present

## 2024-01-19 DIAGNOSIS — R519 Headache, unspecified: Secondary | ICD-10-CM | POA: Diagnosis not present

## 2024-01-19 DIAGNOSIS — E1169 Type 2 diabetes mellitus with other specified complication: Secondary | ICD-10-CM | POA: Diagnosis not present

## 2024-01-19 DIAGNOSIS — E785 Hyperlipidemia, unspecified: Secondary | ICD-10-CM | POA: Diagnosis not present

## 2024-01-19 DIAGNOSIS — I10 Essential (primary) hypertension: Secondary | ICD-10-CM | POA: Diagnosis not present

## 2024-02-06 DIAGNOSIS — L97521 Non-pressure chronic ulcer of other part of left foot limited to breakdown of skin: Secondary | ICD-10-CM | POA: Diagnosis not present

## 2024-02-06 DIAGNOSIS — E1140 Type 2 diabetes mellitus with diabetic neuropathy, unspecified: Secondary | ICD-10-CM | POA: Diagnosis not present

## 2024-02-23 DIAGNOSIS — E0822 Diabetes mellitus due to underlying condition with diabetic chronic kidney disease: Secondary | ICD-10-CM | POA: Diagnosis not present

## 2024-02-23 DIAGNOSIS — I10 Essential (primary) hypertension: Secondary | ICD-10-CM | POA: Diagnosis not present

## 2024-02-23 DIAGNOSIS — N1831 Chronic kidney disease, stage 3a: Secondary | ICD-10-CM | POA: Diagnosis not present

## 2024-02-26 DIAGNOSIS — E0822 Diabetes mellitus due to underlying condition with diabetic chronic kidney disease: Secondary | ICD-10-CM | POA: Diagnosis not present

## 2024-02-26 DIAGNOSIS — I10 Essential (primary) hypertension: Secondary | ICD-10-CM | POA: Diagnosis not present

## 2024-02-26 DIAGNOSIS — N1831 Chronic kidney disease, stage 3a: Secondary | ICD-10-CM | POA: Diagnosis not present

## 2024-02-27 ENCOUNTER — Ambulatory Visit: Payer: Medicare HMO | Attending: Cardiovascular Disease | Admitting: Cardiovascular Disease

## 2024-02-27 ENCOUNTER — Encounter: Payer: Self-pay | Admitting: Cardiovascular Disease

## 2024-02-27 VITALS — BP 136/62 | HR 73 | Ht 65.0 in | Wt 187.6 lb

## 2024-02-27 DIAGNOSIS — I739 Peripheral vascular disease, unspecified: Secondary | ICD-10-CM | POA: Diagnosis not present

## 2024-02-27 DIAGNOSIS — R0989 Other specified symptoms and signs involving the circulatory and respiratory systems: Secondary | ICD-10-CM | POA: Diagnosis not present

## 2024-02-27 DIAGNOSIS — E785 Hyperlipidemia, unspecified: Secondary | ICD-10-CM | POA: Diagnosis not present

## 2024-02-27 DIAGNOSIS — I10 Essential (primary) hypertension: Secondary | ICD-10-CM | POA: Diagnosis not present

## 2024-02-27 DIAGNOSIS — I2510 Atherosclerotic heart disease of native coronary artery without angina pectoris: Secondary | ICD-10-CM

## 2024-02-27 MED ORDER — CEPHALEXIN 500 MG PO CAPS: 500.0000 mg | ORAL_CAPSULE | Freq: Three times a day (TID) | ORAL | 0 refills | Status: AC

## 2024-02-27 NOTE — Patient Instructions (Signed)
 Medication Instructions:  START Keflex 500 mg three times daily for 5 days  *If you need a refill on your cardiac medications before your next appointment, please call your pharmacy*   Lab Work: None ordered If you have labs (blood work) drawn today and your tests are completely normal, you will receive your results only by: MyChart Message (if you have MyChart) OR A paper copy in the mail If you have any lab test that is abnormal or we need to change your treatment, we will call you to review the results.   Testing/Procedures: None ordered   Follow-Up: At [facility], you and your health needs are our priority.  As part of our continuing mission to provide you with exceptional heart care, we have created designated Provider Care Teams.  These Care Teams include your primary Cardiologist (physician) and Advanced Practice Providers (APPs -  Physician Assistants and Nurse Practitioners) who all work together to provide you with the care you need, when you need it.  We recommend signing up for the patient portal called "MyChart".  Sign up information is provided on this After Visit Summary.  MyChart is used to connect with patients for Virtual Visits (Telemedicine).  Patients are able to view lab/test results, encounter notes, upcoming appointments, etc.  Non-urgent messages can be sent to your provider as well.   To learn more about what you can do with MyChart, go to [email or web address].    Your next appointment:   1 month(s)  Provider:   Dr. Kirke Corin

## 2024-02-27 NOTE — Progress Notes (Signed)
 Cardiology Office Note   Date:  02/27/2024   ID:  Jasmine Davidson, DOB Oct 07, 1956, MRN [id number]  PCP:  Preston Fleeting, MD  Cardiologist: Dr. Mariah Milling  Chief Complaint  Patient presents with   Follow-up    4 month follow up visit. Patient is doing well on today. Meds reviewed.       History of Present Illness: Jasmine Davidson is a 68 y.o. female who is here today for follow-up visit regarding peripheral arterial disease.   She has history of mild to moderate nonobstructive coronary artery disease, diabetes mellitus, essential hypertension, hyperlipidemia, stage III chronic kidney disease and peripheral arterial disease.  She has followed in the past at [facility] vascular surgery clinic for chronic venous insufficiency status post right GSV stripping and left GSV venous ablation.  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  She had worsening claudication in 2023.  Angiography in October 2023 showed no significant aortoiliac disease but the aorta and the iliac arteries were significantly calcified.  On the left side, there was heavily calcified stenosis in the proximal SFA, short occlusion of the proximal popliteal artery with reconstitution via extensive collaterals above the knee and three-vessel runoff below the knee.  I performed directional atherectomy and drug-coated balloon angioplasty to the proximal left SFA.  The popliteal artery occlusion was left to be treated medically.  She had nonhealing wound on the right big toe in 2024 and thus she underwent peripheral angiography in May of 2024 which showed severe calcified stenosis in the distal SFA extending into the proximal popliteal artery with three-vessel runoff below the knee.  I performed successful directional atherectomy and drug-coated balloon angioplasty to the right distal SFA extending into the popliteal artery. The ulceration on the right big toe  healed completely.  She developed a small ulceration on the bottom of the left foot and underwent debridement by podiatry with some improvement.  In addition, she had a small superficial ulceration on the left lateral ankle with some redness.  She is known to have occluded above-the-knee left popliteal artery with collaterals.    Past Medical History:  Diagnosis Date   CKD (chronic kidney disease) stage 3, GFR 30-59 ml/min (HCC)    Diabetes mellitus    Diastolic dysfunction    a. 07/2020 Echo: EF 65-70%, no rwma, GrI DD, nl RV fxn, mod dil Jasmine.   HTN (hypertension)    Hyperlipidemia    Non-obstructive CAD (coronary artery disease)    a. 07/2020 Cath: LM nl, LAD 17m, RI small-mild diff dzs, LCX nl, RCA min irregs.   Obesity    PAD (peripheral artery disease) (HCC)    a. 09/2022 LE Angio/PTA: Heavily Ca2+ Abd Ao and iliacs. LCIA 30, LSFA 70p (atherectomy/DBA), L Pop 100 above-knee w/ reconstitution via collats above knee joint and 3V runoff; b. 04/2023 PTA/Angio: Sev Ca2+ dzs dist RSFA extending into prox R popliteal (45-38mm syst gradient)-->directional atherectomy and DCBA.    Past Surgical History:  Procedure Laterality Date   ABDOMINAL AORTOGRAM W/LOWER EXTREMITY N/A 09/14/2022   Procedure: ABDOMINAL AORTOGRAM W/LOWER EXTREMITY;  Surgeon: Iran Ouch, MD;  [facility];  Service: Cardiovascular;  Laterality: N/A;   ABDOMINAL AORTOGRAM W/LOWER EXTREMITY N/A 05/03/2023   Procedure: ABDOMINAL AORTOGRAM W/LOWER EXTREMITY;  Surgeon: Iran Ouch, MD;  [facility]: [facility];  Service: Cardiovascular;  Laterality: N/A;   BLADDER REPAIR W/ CESAREAN  SECTION  1981   BREAST BIOPSY Right 12/17/2014   negative stereotactic   CARDIAC CATHETERIZATION     COLONOSCOPY WITH PROPOFOL N/A 03/30/2021   Procedure: COLONOSCOPY WITH PROPOFOL;  Surgeon: Regis Bill, MD;  Location: [facility];  Service: Endoscopy;  Laterality: N/A;  DM   cyst taken off finger  2011    ESOPHAGOGASTRODUODENOSCOPY (EGD) WITH PROPOFOL N/A 03/30/2021   Procedure: ESOPHAGOGASTRODUODENOSCOPY (EGD) WITH PROPOFOL;  Surgeon: Regis Bill, MD;  Location: [facility];  Service: Endoscopy;  Laterality: N/A;   LEFT HEART CATH AND CORONARY ANGIOGRAPHY N/A 07/21/2020   Procedure: LEFT HEART CATH AND CORONARY ANGIOGRAPHY;  Surgeon: Yvonne Kendall, MD;  Location: [facility];  Service: Cardiovascular;  Laterality: N/A;   PERIPHERAL VASCULAR ATHERECTOMY Left 09/14/2022   Procedure: PERIPHERAL VASCULAR ATHERECTOMY;  Surgeon: Iran Ouch, MD;  [facility]: [facility];  Service: Cardiovascular;  Laterality: Left;  SFA   PERIPHERAL VASCULAR BALLOON ANGIOPLASTY Left 09/14/2022   Procedure: PERIPHERAL VASCULAR BALLOON ANGIOPLASTY;  Surgeon: Iran Ouch, MD;  [facility]: [facility];  Service: Cardiovascular;  Laterality: Left;  SFA   PERIPHERAL VASCULAR BALLOON ANGIOPLASTY  05/03/2023   Procedure: PERIPHERAL VASCULAR BALLOON ANGIOPLASTY;  Surgeon: Iran Ouch, MD;  [facility]: [facility];  Service: Cardiovascular;;   TOE SURGERY  02/08/2013     Current Outpatient Medications  Medication Sig Dispense Refill   acetaminophen (TYLENOL) 500 MG tablet Take 1,000 mg by mouth every 6 (six) hours as needed for moderate pain.     aspirin (ASPIRIN 81) 81 MG EC tablet Take 81 mg by mouth daily.     atorvastatin (LIPITOR) 40 MG tablet TAKE ONE TABLET BY MOUTH EVERY DAY 30 tablet 11   Calcium Carb-Cholecalciferol (CALCIUM 600 + D PO) Take 2 tablets by mouth daily.     Carboxymethylcellul-Glycerin (REFRESH RELIEVA OP) Place 1 drop into both eyes every other day.     carvedilol (COREG) 12.5 MG tablet TAKE 1 TABLET BY MOUTH 2 TIMES A DAY WITH A MEAL 180 tablet 1   cephALEXin (KEFLEX) 500 MG capsule Take 1 capsule (500 mg total) by mouth 3 (three) times daily for 5 days. 15 capsule 0   cetirizine (ZYRTEC) 10 MG tablet Take 10 mg by mouth daily.     Cholecalciferol  (EQL VITAMIN D3) 25 MCG (1000 UT) capsule Take 1,000 Units by mouth daily.     empagliflozin (JARDIANCE) 25 MG TABS tablet Take 25 mg by mouth daily.     Ferrous Sulfate (IRON) 325 (65 Fe) MG TABS Take 325 mg by mouth daily.     fluticasone (FLONASE) 50 MCG/ACT nasal spray Place 1 spray into both nostrils at bedtime.     furosemide (LASIX) 20 MG tablet Take 1 tablet (20 mg total) by mouth daily as needed. 90 tablet 2   gabapentin (NEURONTIN) 300 MG capsule Take 300 mg by mouth 2 (two) times daily.      lisinopril (ZESTRIL) 10 MG tablet TAKE 1 TABLET BY MOUTH ONCE DAILY. 90 tablet 3   loratadine (CLARITIN) 10 MG tablet Take by mouth.     metFORMIN (GLUCOPHAGE) 1000 MG tablet Take 1,000 mg by mouth 2 (two) times daily.     Neomycin-Bacitracin-Polymyxin (TRIPLE ANTIBIOTIC) OINT Apply 1 Application topically daily as needed (wound care).     OZEMPIC, 0.25 OR 0.5 MG/DOSE, 2 MG/3ML SOPN Inject 0.25 mg into the skin once a week.     venlafaxine (EFFEXOR) 37.5 MG tablet Take  37.5 mg by mouth daily.     vitamin B-12 (CYANOCOBALAMIN) 1000 MCG tablet Take 1,000 mcg by mouth daily.     amoxicillin-clavulanate (AUGMENTIN) 875-125 MG tablet Take 1 tablet by mouth 2 (two) times daily. (Patient not taking: Reported on 02/27/2024)     liraglutide (VICTOZA) 18 MG/3ML SOPN Inject 1.2 mg into the skin daily. (Patient not taking: Reported on 02/27/2024)     No current facility-administered medications for this visit.    Allergies:   Pregabalin    Social History:  The patient  reports that she has never smoked. She has never used smokeless tobacco. She reports that she does not drink alcohol and does not use drugs.   Family History:  The patient's family history includes COPD in her mother; Diabetes in her father; Heart failure in her father; Hypertension in her father; Lupus in her father.    ROS:  Please see the history of present illness.   Otherwise, review of systems are positive for none.   All other  systems are reviewed and negative.    PHYSICAL EXAM: VS:  BP 136/62   Pulse 73   Ht 5\' 5"  (1.651 m)   Wt 187 lb 9.6 oz (85.1 kg)   LMP 09/12/1999   SpO2 100%   BMI 31.22 kg/m  , BMI Body mass index is 31.22 kg/m. GEN: Well nourished, well developed, in no acute distress  HEENT: normal  Neck: no JVD  or masses.  Left carotid bruit Cardiac: RRR; no murmurs, rubs, or gallops,no edema  Respiratory:  clear to auscultation bilaterally, normal work of breathing GI: soft, nontender, nondistended, + BS MS: no deformity or atrophy  Skin: warm and dry, no rash Neuro:  Strength and sensation are intact Psych: euthymic mood, full affect Pulses are not palpable in the left foot.  She has small superficial ulceration less than 1 cm in diameter on the lateral left ankle.  There is a small callus ulceration in the bottom of the left foot.   EKG:  EKG is not ordered today.    Recent Labs: 05/02/2023: BUN 19; Creatinine, Ser 1.18; Hemoglobin 12.7; Platelets 157; Potassium 4.2; Sodium 137    Lipid Panel    Component Value Date/Time   CHOL 108 07/20/2020 0434   TRIG 88 07/20/2020 0434   TRIG 49 11/16/2009 0000   HDL 28 (L) 07/20/2020 0434   CHOLHDL 3.9 07/20/2020 0434   VLDL 18 07/20/2020 0434   LDLCALC 62 07/20/2020 0434   LDLCALC 58 11/16/2009 0000      Wt Readings from Last 3 Encounters:  02/27/24 187 lb 9.6 oz (85.1 kg)  10/19/23 199 lb 9.6 oz (90.5 kg)  09/26/23 197 lb 9.6 oz (89.6 kg)           No data to display            ASSESSMENT AND PLAN:  1.  Peripheral arterial disease: Status post left SFA atherectomy and drug-coated balloon angioplasty .  She is status post atherectomy and drug-coated balloon angioplasty to the right SFA/popliteal arteries .  Her ABI improved to normal on the right side with normal toe pressure.  This has allowed healing of the right big toe ulcer. She now has small superficial ulceration involving the left foot and lateral left ankle.   She reports improvement overall.  I suspect she will require revascularization of the occluded left popliteal artery but she wants to wait given improvement in the ulceration.  I gave her Keflex  500 mg 3 times daily for 5 days and will reevaluate in 1 month.  If she has any ulceration on the left foot, we will proceed with angiography and likely intervention.   2.  Coronary artery disease involving native coronary arteries without angina: She reports no anginal symptoms.  Continue medical therapy.  3.  Hyperlipidemia: Continue atorvastatin 40 mg once daily with a target LDL of less than 70.  Most recent labs showed an LDL of 63.  4.  Essential hypertension: Blood pressure is elevated here but she reports normal readings at home.  Continue to monitor for now.  5.  Left carotid bruit: Most recent carotid Doppler in October 2023 showed mild nonobstructive disease bilaterally.    Disposition:  F/U in 1 month.  Signed,  Lorine Bears, MD  02/27/2024 3:38 PM     [facility]

## 2024-03-05 ENCOUNTER — Encounter: Payer: Self-pay | Admitting: Nurse Practitioner

## 2024-03-05 ENCOUNTER — Ambulatory Visit: Payer: Self-pay | Attending: Nurse Practitioner | Admitting: Nurse Practitioner

## 2024-03-05 VITALS — BP 124/72 | HR 73 | Ht 65.0 in | Wt 186.4 lb

## 2024-03-05 DIAGNOSIS — E118 Type 2 diabetes mellitus with unspecified complications: Secondary | ICD-10-CM

## 2024-03-05 DIAGNOSIS — E785 Hyperlipidemia, unspecified: Secondary | ICD-10-CM

## 2024-03-05 DIAGNOSIS — Z794 Long term (current) use of insulin: Secondary | ICD-10-CM

## 2024-03-05 DIAGNOSIS — I2510 Atherosclerotic heart disease of native coronary artery without angina pectoris: Secondary | ICD-10-CM | POA: Diagnosis not present

## 2024-03-05 DIAGNOSIS — I739 Peripheral vascular disease, unspecified: Secondary | ICD-10-CM

## 2024-03-05 DIAGNOSIS — R0989 Other specified symptoms and signs involving the circulatory and respiratory systems: Secondary | ICD-10-CM | POA: Diagnosis not present

## 2024-03-05 DIAGNOSIS — I10 Essential (primary) hypertension: Secondary | ICD-10-CM | POA: Diagnosis not present

## 2024-03-05 DIAGNOSIS — N1831 Chronic kidney disease, stage 3a: Secondary | ICD-10-CM | POA: Diagnosis not present

## 2024-03-05 DIAGNOSIS — L97521 Non-pressure chronic ulcer of other part of left foot limited to breakdown of skin: Secondary | ICD-10-CM | POA: Diagnosis not present

## 2024-03-05 DIAGNOSIS — Z89422 Acquired absence of other left toe(s): Secondary | ICD-10-CM | POA: Diagnosis not present

## 2024-03-05 DIAGNOSIS — E1140 Type 2 diabetes mellitus with diabetic neuropathy, unspecified: Secondary | ICD-10-CM | POA: Diagnosis not present

## 2024-03-05 NOTE — Progress Notes (Signed)
 Office Visit    Patient Name: Jasmine Davidson Date of Encounter: 03/05/2024  Primary Care Provider:  Preston Fleeting, MD Primary Cardiologist:  Julien Nordmann, MD  Chief Complaint    68 y.o. female with a history of nonobstructive CAD, hypertension, hyperlipidemia, diabetes, vasovagal syncope, PSVT, nonsustained VT, diabetic retinopathy and neuropathy, stage III chronic kidney disease, and peripheral arterial disease, who presents for follow-up related to PAD.  Past Medical History  Subjective   Past Medical History:  Diagnosis Date   CKD (chronic kidney disease) stage 3, GFR 30-59 ml/min (HCC)    Diabetes mellitus    Diastolic dysfunction    a. 07/2020 Echo: EF 65-70%, no rwma, GrI DD, nl RV fxn, mod dil LA.   HTN (hypertension)    Hyperlipidemia    Non-obstructive CAD (coronary artery disease)    a. 07/2020 Cath: LM nl, LAD 98m, RI small-mild diff dzs, LCX nl, RCA min irregs.   Obesity    PAD (peripheral artery disease) (HCC)    a. 09/2022 LE Angio/PTA: Heavily Ca2+ Abd Ao and iliacs. LCIA 30, LSFA 70p (atherectomy/DBA), L Pop 100 above-knee w/ reconstitution via collats above knee joint and 3V runoff; b. 04/2023 PTA/Angio: Sev Ca2+ dzs dist RSFA extending into prox R popliteal (45-62mm syst gradient)-->directional atherectomy and DCBA.   Past Surgical History:  Procedure Laterality Date   ABDOMINAL AORTOGRAM W/LOWER EXTREMITY N/A 09/14/2022   Procedure: ABDOMINAL AORTOGRAM W/LOWER EXTREMITY;  Surgeon: Iran Ouch, MD;  Location: [facility];  Service: Cardiovascular;  Laterality: N/A;   ABDOMINAL AORTOGRAM W/LOWER EXTREMITY N/A 05/03/2023   Procedure: ABDOMINAL AORTOGRAM W/LOWER EXTREMITY;  Surgeon: Iran Ouch, MD;  Location: [facility];  Service: Cardiovascular;  Laterality: N/A;   BLADDER REPAIR W/ CESAREAN SECTION  1981   BREAST BIOPSY Right 12/17/2014   negative stereotactic   CARDIAC CATHETERIZATION     COLONOSCOPY WITH PROPOFOL N/A  03/30/2021   Procedure: COLONOSCOPY WITH PROPOFOL;  Surgeon: Regis Bill, MD;  Location: [facility];  Service: Endoscopy;  Laterality: N/A;  DM   cyst taken off finger  2011   ESOPHAGOGASTRODUODENOSCOPY (EGD) WITH PROPOFOL N/A 03/30/2021   Procedure: ESOPHAGOGASTRODUODENOSCOPY (EGD) WITH PROPOFOL;  Surgeon: Regis Bill, MD;  Location: [facility];  Service: Endoscopy;  Laterality: N/A;   LEFT HEART CATH AND CORONARY ANGIOGRAPHY N/A 07/21/2020   Procedure: LEFT HEART CATH AND CORONARY ANGIOGRAPHY;  Surgeon: Yvonne Kendall, MD;  Location: [facility];  Service: Cardiovascular;  Laterality: N/A;   PERIPHERAL VASCULAR ATHERECTOMY Left 09/14/2022   Procedure: PERIPHERAL VASCULAR ATHERECTOMY;  Surgeon: Iran Ouch, MD;  Location: [facility];  Service: Cardiovascular;  Laterality: Left;  SFA   PERIPHERAL VASCULAR BALLOON ANGIOPLASTY Left 09/14/2022   Procedure: PERIPHERAL VASCULAR BALLOON ANGIOPLASTY;  Surgeon: Iran Ouch, MD;  Location: [facility];  Service: Cardiovascular;  Laterality: Left;  SFA   PERIPHERAL VASCULAR BALLOON ANGIOPLASTY  05/03/2023   Procedure: PERIPHERAL VASCULAR BALLOON ANGIOPLASTY;  Surgeon: Iran Ouch, MD;  Location: [facility];  Service: Cardiovascular;;   TOE SURGERY  02/08/2013    Allergies  Allergies  Allergen Reactions   Pregabalin Other (See Comments)    Could not function  "Makes me feel drugged up"    Could not function      History of Present Illness      68 y.o. y/o female with above past medical history including nonobstructive CAD, hypertension, hyperlipidemia, diabetes, stage III  chronic kidney disease, vasovagal syncope, PSVT, nonsustained VT, diabetic retinopathy and neuropathy, and peripheral arterial disease. She was previously followed by vascular surgery at [facility] for chronic venous insufficiency and is status post GSV stripping and left GSV venous ablation. 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 She underwent diagnostic catheterization in August 2021, in the setting of syncope, chest pain, and mild troponin elevation. This showed mild, nonobstructive mid LAD disease, and she was medically managed. Echo at that time showed an EF of 65 to 70% with grade 1 diastolic dysfunction.   In September 2022, she was evaluated for left calf claudication with an ABI of 0.52 on the left. Vascular ultrasound suggested significant disease versus short occlusion in the distal SFA/proximal popliteal artery. Repeat ABI in September of this 2023, was stable at 0.59 on the left and normal on the right. In the setting of progressive claudication, she underwent lower extremity arterial angiography in October 2023, revealing heavily calcified abdominal aortic and iliac disease with 70% stenosis in the left SFA and an occluded left popliteal with reconstitution via collaterals above the knee joint and three-vessel runoff. The left SFA was successfully treated with drug-coated balloon angioplasty.   She was seen in May 2024 after recent ABI showed a slight drop on the right at 0.87 with stable ABI on the left (0.67).  Duplex showed moderate iliac disease on the right with moderate right SFA disease and at least two-vessel runoff below the knee.  On the left, the proximal SFA was patent with normal velocities in the proximal left popliteal artery was chronically occluded.  At office visit on May 21, she reported a poorly healing ulceration of the right great toe over the preceding month, after stubbing it.  She subsequently underwent peripheral angiography which showed severely calcified disease in the distal right SFA extension the right proximal popliteal with a 45 to 50 mm gradient.  This was successfully treated with directional atherectomy and drug-coated balloon angioplasty.  Follow-up ABIs in June 2024 showed improvement on the right and  1.05 stable on the left and 0.63.  She subsequently developed right lower extremity cellulitis, which was treated with cephalexin.     In October 2015, she was evaluated in the office with development of a right great toe ulceration.  Follow-up ABIs were stable at 1.17 on the right and 0.65 on the left, and she did not require peripheral angiography.  She did undergo debridement by podiatry with some improvement.  At most recent office visit on February 27, 2024, she had healing of the right toe ulceration but was noted to have a small superficial ulceration involving the left foot and left lateral ankle, felt to be secondary to occlusion of the left popliteal artery.  She was placed on Keflex therapy with plan for follow-up and consideration for angiography in the future.  Since her visit a week ago, she has noted some improved healing to the left lower extremity.  She was seen by podiatry this morning.  She says that she continues to walk fairly regularly and denies any claudication.  Further, she denies chest pain, dyspnea, palpitations, PND, orthopnea, dizziness, syncope, edema, or early satiety.  She continues to think about whether or not she would like to proceed with angiography however at this time is not ready to commit and we will plan to follow-up with Dr. Renato Gails in 1 month to discuss further. Objective  Home  Medications    Current Outpatient Medications  Medication Sig Dispense Refill   acetaminophen (TYLENOL) 500 MG tablet Take 1,000 mg by mouth every 6 (six) hours as needed for moderate pain.     aspirin (ASPIRIN 81) 81 MG EC tablet Take 81 mg by mouth daily.     atorvastatin (LIPITOR) 40 MG tablet TAKE ONE TABLET BY MOUTH EVERY DAY 30 tablet 11   Calcium Carb-Cholecalciferol (CALCIUM 600 + D PO) Take 2 tablets by mouth daily.     Carboxymethylcellul-Glycerin (REFRESH RELIEVA OP) Place 1 drop into both eyes every other day.     carvedilol (COREG) 12.5 MG tablet TAKE 1 TABLET BY MOUTH 2 TIMES  A DAY WITH A MEAL 180 tablet 1   cetirizine (ZYRTEC) 10 MG tablet Take 10 mg by mouth daily.     Cholecalciferol (EQL VITAMIN D3) 25 MCG (1000 UT) capsule Take 1,000 Units by mouth daily.     empagliflozin (JARDIANCE) 25 MG TABS tablet Take 25 mg by mouth daily.     Ferrous Sulfate (IRON) 325 (65 Fe) MG TABS Take 325 mg by mouth daily.     fluticasone (FLONASE) 50 MCG/ACT nasal spray Place 1 spray into both nostrils at bedtime.     furosemide (LASIX) 20 MG tablet Take 1 tablet (20 mg total) by mouth daily as needed. 90 tablet 2   gabapentin (NEURONTIN) 300 MG capsule Take 300 mg by mouth 2 (two) times daily.      lisinopril (ZESTRIL) 10 MG tablet TAKE 1 TABLET BY MOUTH ONCE DAILY. 90 tablet 3   loratadine (CLARITIN) 10 MG tablet Take by mouth.     metFORMIN (GLUCOPHAGE) 1000 MG tablet Take 1,000 mg by mouth 2 (two) times daily.     Neomycin-Bacitracin-Polymyxin (TRIPLE ANTIBIOTIC) OINT Apply 1 Application topically daily as needed (wound care).     OZEMPIC, 0.25 OR 0.5 MG/DOSE, 2 MG/3ML SOPN Inject 0.25 mg into the skin once a week.     venlafaxine (EFFEXOR) 37.5 MG tablet Take 37.5 mg by mouth daily.     vitamin B-12 (CYANOCOBALAMIN) 1000 MCG tablet Take 1,000 mcg by mouth daily.     No current facility-administered medications for this visit.     Physical Exam    VS:  BP 124/72   Pulse 73   Ht 5\' 5"  (1.651 m)   Wt 186 lb 6.4 oz (84.6 kg)   LMP 09/12/1999   SpO2 95%   BMI 31.02 kg/m  , BMI Body mass index is 31.02 kg/m.       GEN: Well nourished, well developed, in no acute distress. HEENT: normal. Neck: Supple, no JVD.  Left carotid bruit noted. Cardiac: RRR, no murmurs, rubs, or gallops. No clubbing, cyanosis, edema.  Radials 2+ and equal bilaterally.  Pulses are nonpalpable on the left foot.  Small superficial ulceration involving the left lateral ankle, measuring approximately 5 mm.  Her left foot is wrapped following podiatry appointment earlier today. Respiratory:   Respirations regular and unlabored, clear to auscultation bilaterally. GI: Soft, nontender, nondistended, BS + x 4. MS: no deformity or atrophy. Skin: warm and dry, no rash. Neuro:  Strength and sensation are intact. Psych: Normal affect.  Accessory Clinical Findings     Labs dated January 19, 2024 from Care Everywhere:  Hemoglobin 14.0, hematocrit 42.7, WBC 6.5, platelets 140 Sodium 140, potassium 4.2, chloride 100, CO2 30.6, BUN 24, creatinine 1.1, glucose 104 Total bilirubin 0.8, alkaline phosphatase 82, AST 16, ALT 11 Calcium 9.2, total protein  6.3, albumin 4.1 Total cholesterol 130, triglycerides 84, HDL 37, LDL 76  Labs dated February 23, 2024 from [facility] Everywhere:  Hemoglobin A1c 7.2 Sodium 143, potassium 3.9, chloride 100, CO2 31, BUN 19, creatinine 0.95, glucose 126    Assessment & Plan    1.  Peripheral arterial disease: Status post left SFA atherectomy and drug-coated balloon angioplasty as well as right SFA and popliteal artery atherectomy and drug-coated balloon angioplasty.  ABI subsequently normalized in the right with normal toe pressure, allowing for healing of previous right big toe ulcer.  She was recently evaluated in the setting of a small superficial ulceration involving the left foot and left lateral ankle for which she was treated with cephalexin therapy x 5 days.  She has completed this.  She feels that the ulceration on the left ankle has improved and he does appear to be healing well today.  She just saw podiatry this morning and her foot is wrapped.  She has been thinking about the prospect of lower extremity angiography however at this point is not ready to commit and would like to keep her appoint with Dr. Kirke Corin next month to discuss at that time.  She remains on aspirin, statin.  2.  Coronary artery disease: Nonobstructive CAD on diagnostic catheterization August 2021.  She denies chest pain or dyspnea.  She remains on aspirin and statin therapy.  3.  Primary  hypertension: Blood pressure was elevated initially but on repeat check, came down to 124/72.  She remains on carvedilol and lisinopril therapy.  4.  Left carotid bruit: Nonobstructive disease bilaterally by carotid Doppler in 2023.  5.  Type 2 diabetes mellitus: Followed by primary care.  A1c recently 7.2.  She remains on Jardiance, Victoza, and Ozempic.  6.  Stage III chronic kidney disease: Creatinine 1.1 in February 2025.  7.  Hyperlipidemia: LDL of 76 in February with normal LFTs.  She is on atorvastatin 40 mg daily.  Would like to see increased activity, improve dietary habits, and weight loss, though we will need to consider addition of ezetimibe versus further titration of atorvastatin.  8.  Disposition: Follow-up with Dr. Kirke Corin as scheduled in April.  Nicolasa Ducking, NP 03/05/2024, 5:56 PM

## 2024-03-05 NOTE — Patient Instructions (Signed)
 Medication Instructions:  No changes *If you need a refill on your cardiac medications before your next appointment, please call your pharmacy*   Lab Work: None ordered If you have labs (blood work) drawn today and your tests are completely normal, you will receive your results only by: MyChart Message (if you have MyChart) OR A paper copy in the mail If you have any lab test that is abnormal or we need to change your treatment, we will call you to review the results.   Testing/Procedures: None ordered   Follow-Up: At [facility], you and your health needs are our priority.  As part of our continuing mission to provide you with exceptional heart care, we have created designated Provider Care Teams.  These Care Teams include your primary Cardiologist (physician) and Advanced Practice Providers (APPs -  Physician Assistants and Nurse Practitioners) who all work together to provide you with the care you need, when you need it.  We recommend signing up for the patient portal called "MyChart".  Sign up information is provided on this After Visit Summary.  MyChart is used to connect with patients for Virtual Visits (Telemedicine).  Patients are able to view lab/test results, encounter notes, upcoming appointments, etc.  Non-urgent messages can be sent to your provider as well.   To learn more about what you can do with MyChart, go to [email or web address].    Your next appointment:   Keep follow up as scheduled

## 2024-04-02 ENCOUNTER — Ambulatory Visit: Attending: Cardiovascular Disease | Admitting: Cardiovascular Disease

## 2024-04-02 ENCOUNTER — Encounter: Payer: Self-pay | Admitting: Cardiovascular Disease

## 2024-04-02 VITALS — BP 140/70 | HR 84 | Ht 65.0 in | Wt 187.4 lb

## 2024-04-02 DIAGNOSIS — E785 Hyperlipidemia, unspecified: Secondary | ICD-10-CM

## 2024-04-02 DIAGNOSIS — R0989 Other specified symptoms and signs involving the circulatory and respiratory systems: Secondary | ICD-10-CM

## 2024-04-02 DIAGNOSIS — I10 Essential (primary) hypertension: Secondary | ICD-10-CM | POA: Diagnosis not present

## 2024-04-02 DIAGNOSIS — I739 Peripheral vascular disease, unspecified: Secondary | ICD-10-CM

## 2024-04-02 DIAGNOSIS — I2510 Atherosclerotic heart disease of native coronary artery without angina pectoris: Secondary | ICD-10-CM

## 2024-04-02 NOTE — Progress Notes (Signed)
 Cardiology Office Note   Date:  04/03/2024   ID:  Jasmine Davidson, DOB 1956/05/22, MRN [id number]  PCP:  Elmyra Haggard, FNP  Cardiologist: Dr. Gollan  No chief complaint on file.     History of Present Illness: Jasmine Davidson is a 68 y.o. female who is here today for follow-up visit regarding peripheral arterial disease.   She has history of mild to moderate nonobstructive coronary artery disease, diabetes mellitus, essential hypertension, hyperlipidemia, stage III chronic kidney disease and peripheral arterial disease.  She has followed in the past at [facility] vascular surgery clinic for chronic venous insufficiency status post right GSV stripping and left GSV venous ablation.  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  She had worsening claudication in 2023.  Angiography in October 2023 showed no significant aortoiliac disease but the aorta and the iliac arteries were significantly calcified.  On the left side, there was heavily calcified stenosis in the proximal SFA, short occlusion of the proximal popliteal artery with reconstitution via extensive collaterals above the knee and three-vessel runoff below the knee.  I performed directional atherectomy and drug-coated balloon angioplasty to the proximal left SFA.  The popliteal artery occlusion was left to be treated medically.  She had nonhealing wound on the right big toe in 2024 and thus she underwent peripheral angiography in May of 2024 which showed severe calcified stenosis in the distal SFA extending into the proximal popliteal artery with three-vessel runoff below the knee.  I performed successful directional atherectomy and drug-coated balloon angioplasty to the right distal SFA extending into the popliteal artery. The ulceration on the right big toe healed completely.  She developed a small ulceration on the bottom of the left foot and underwent debridement  by podiatry with some improvement.  In addition, she had a small superficial ulceration on the left lateral ankle with some redness.  She is known to have occluded above-the-knee left popliteal artery with collaterals.  I gave her a course of Keflex  during last visit and the ulceration on the left lateral ankle decreased in size significantly and almost completely closed.  In the bottom of the left foot there is a callus formation with no open ulceration.  She denies left calf claudication.    Past Medical History:  Diagnosis Date   CKD (chronic kidney disease) stage 3, GFR 30-59 ml/min (HCC)    Diabetes mellitus    Diastolic dysfunction    a. 07/2020 Echo: EF 65-70%, no rwma, GrI DD, nl RV fxn, mod dil LA.   HTN (hypertension)    Hyperlipidemia    Non-obstructive CAD (coronary artery disease)    a. 07/2020 Cath: LM nl, LAD 57m, RI small-mild diff dzs, LCX nl, RCA min irregs.   Obesity    PAD (peripheral artery disease) (HCC)    a. 09/2022 LE Angio/PTA: Heavily Ca2+ Abd Ao and iliacs. LCIA 30, LSFA 70p (atherectomy/DBA), L Pop 100 above-knee w/ reconstitution via collats above knee joint and 3V runoff; b. 04/2023 PTA/Angio: Sev Ca2+ dzs dist RSFA extending into prox R popliteal (45-35mm syst gradient)-->directional atherectomy and DCBA.    Past Surgical History:  Procedure Laterality Date   ABDOMINAL AORTOGRAM W/LOWER EXTREMITY N/A 09/14/2022   Procedure: ABDOMINAL AORTOGRAM W/LOWER EXTREMITY;  Surgeon: Wenona Hamilton, MD;  Location: [facility];  Service: Cardiovascular;  Laterality: N/A;   ABDOMINAL AORTOGRAM W/LOWER EXTREMITY N/A 05/03/2023   Procedure:  ABDOMINAL AORTOGRAM W/LOWER EXTREMITY;  Surgeon: Wenona Hamilton, MD;  Location: [facility];  Service: Cardiovascular;  Laterality: N/A;   BLADDER REPAIR W/ CESAREAN SECTION  1981   BREAST BIOPSY Right 12/17/2014   negative stereotactic   CARDIAC CATHETERIZATION     COLONOSCOPY WITH PROPOFOL  N/A 03/30/2021   Procedure:  COLONOSCOPY WITH PROPOFOL ;  Surgeon: Shane Darling, MD;  Location: [facility];  Service: Endoscopy;  Laterality: N/A;  DM   cyst taken off finger  2011   ESOPHAGOGASTRODUODENOSCOPY (EGD) WITH PROPOFOL  N/A 03/30/2021   Procedure: ESOPHAGOGASTRODUODENOSCOPY (EGD) WITH PROPOFOL ;  Surgeon: Shane Darling, MD;  Location: [facility] ENDOSCOPY;  Service: Endoscopy;  Laterality: N/A;   LEFT HEART CATH AND CORONARY ANGIOGRAPHY N/A 07/21/2020   Procedure: LEFT HEART CATH AND CORONARY ANGIOGRAPHY;  Surgeon: Sammy Crisp, MD;  Location: [facility];  Service: Cardiovascular;  Laterality: N/A;   PERIPHERAL VASCULAR ATHERECTOMY Left 09/14/2022   Procedure: PERIPHERAL VASCULAR ATHERECTOMY;  Surgeon: Wenona Hamilton, MD;  Location: [facility];  Service: Cardiovascular;  Laterality: Left;  SFA   PERIPHERAL VASCULAR BALLOON ANGIOPLASTY Left 09/14/2022   Procedure: PERIPHERAL VASCULAR BALLOON ANGIOPLASTY;  Surgeon: Wenona Hamilton, MD;  Location: [facility];  Service: Cardiovascular;  Laterality: Left;  SFA   PERIPHERAL VASCULAR BALLOON ANGIOPLASTY  05/03/2023   Procedure: PERIPHERAL VASCULAR BALLOON ANGIOPLASTY;  Surgeon: Wenona Hamilton, MD;  Location: [facility];  Service: Cardiovascular;;   TOE SURGERY  02/08/2013     Current Outpatient Medications  Medication Sig Dispense Refill   acetaminophen  (TYLENOL ) 500 MG tablet Take 1,000 mg by mouth every 6 (six) hours as needed for moderate pain.     aspirin  (ASPIRIN  81) 81 MG EC tablet Take 81 mg by mouth daily.     atorvastatin  (LIPITOR) 40 MG tablet TAKE ONE TABLET BY MOUTH EVERY DAY 30 tablet 11   Calcium  Carb-Cholecalciferol (CALCIUM  600 + D PO) Take 2 tablets by mouth daily.     Carboxymethylcellul-Glycerin (REFRESH RELIEVA OP) Place 1 drop into both eyes every other day.     carvedilol  (COREG ) 12.5 MG tablet TAKE 1 TABLET BY MOUTH 2 TIMES A DAY WITH A MEAL 180 tablet 1   cetirizine (ZYRTEC) 10 MG tablet Take 10  mg by mouth daily.     Cholecalciferol (EQL VITAMIN D3) 25 MCG (1000 UT) capsule Take 1,000 Units by mouth daily.     empagliflozin  (JARDIANCE ) 25 MG TABS tablet Take 25 mg by mouth daily.     Ferrous Sulfate (IRON) 325 (65 Fe) MG TABS Take 325 mg by mouth daily.     fluticasone (FLONASE) 50 MCG/ACT nasal spray Place 1 spray into both nostrils at bedtime.     furosemide  (LASIX ) 20 MG tablet Take 1 tablet (20 mg total) by mouth daily as needed. 90 tablet 2   gabapentin  (NEURONTIN ) 300 MG capsule Take 300 mg by mouth 2 (two) times daily.      lisinopril  (ZESTRIL ) 20 MG tablet Take 20 mg by mouth daily.     loratadine (CLARITIN) 10 MG tablet Take by mouth.     metFORMIN (GLUCOPHAGE) 1000 MG tablet Take 1,000 mg by mouth 2 (two) times daily.     Neomycin-Bacitracin-Polymyxin (TRIPLE ANTIBIOTIC) OINT Apply 1 Application topically daily as needed (wound care).     OZEMPIC, 0.25 OR 0.5 MG/DOSE, 2 MG/3ML SOPN Inject 0.25 mg into the skin once a week.     venlafaxine (EFFEXOR) 37.5 MG tablet  Take 37.5 mg by mouth daily.     vitamin B-12 (CYANOCOBALAMIN) 1000 MCG tablet Take 1,000 mcg by mouth daily.     No current facility-administered medications for this visit.    Allergies:   Pregabalin    Social History:  The patient  reports that she has never smoked. She has never used smokeless tobacco. She reports that she does not drink alcohol and does not use drugs.   Family History:  The patient's family history includes COPD in her mother; Diabetes in her father; Heart failure in her father; Hypertension in her father; Lupus in her father.    ROS:  Please see the history of present illness.   Otherwise, review of systems are positive for none.   All other systems are reviewed and negative.    PHYSICAL EXAM: VS:  BP (!) 140/70   Pulse 84   Ht 5\' 5"  (1.651 m)   Wt 187 lb 6.4 oz (85 kg)   LMP 09/12/1999   SpO2 97%   BMI 31.18 kg/m  , BMI Body mass index is 31.18 kg/m. GEN: Well nourished, well  developed, in no acute distress  HEENT: normal  Neck: no JVD  or masses.  Left carotid bruit Cardiac: RRR; no murmurs, rubs, or gallops,no edema  Respiratory:  clear to auscultation bilaterally, normal work of breathing GI: soft, nontender, nondistended, + BS MS: no deformity or atrophy  Skin: warm and dry, no rash Neuro:  Strength and sensation are intact Psych: euthymic mood, full affect Pulses are not palpable in the left foot.  She has small superficial ulceration 3 mm in diameter on the lateral left ankle.  There is a small callus ulceration in the bottom of the left foot.   EKG:  EKG is ordered today. EKG showed: Sinus rhythm with occasional and consecutive Premature ventricular complexes Left axis deviation Possible Anterolateral infarct (cited on or before 02-Apr-2024) When compared with ECG of 26-Sep-2023 14:43, Premature ventricular complexes are now Present    Recent Labs: 05/02/2023: BUN 19; Creatinine, Ser 1.18; Hemoglobin 12.7; Platelets 157; Potassium 4.2; Sodium 137    Lipid Panel    Component Value Date/Time   CHOL 108 07/20/2020 0434   TRIG 88 07/20/2020 0434   TRIG 49 11/16/2009 0000   HDL 28 (L) 07/20/2020 0434   CHOLHDL 3.9 07/20/2020 0434   VLDL 18 07/20/2020 0434   LDLCALC 62 07/20/2020 0434   LDLCALC 58 11/16/2009 0000      Wt Readings from Last 3 Encounters:  04/02/24 187 lb 6.4 oz (85 kg)  03/05/24 186 lb 6.4 oz (84.6 kg)  02/27/24 187 lb 9.6 oz (85.1 kg)           No data to display            ASSESSMENT AND PLAN:  1.  Peripheral arterial disease: Status post left SFA atherectomy and drug-coated balloon angioplasty .  She is status post atherectomy and drug-coated balloon angioplasty to the right SFA/popliteal arteries .   The ulceration in the left foot has improved significantly and almost completely healed compared to last time.  Revascularization of the occluded left popliteal artery will most likely require femoral-popliteal  bypass which is likely not justified at this point given improvement in ulceration and almost complete healing.   2.  Coronary artery disease involving native coronary arteries without angina: She reports no anginal symptoms.  Continue medical therapy.  3.  Hyperlipidemia: Continue atorvastatin  40 mg once daily with a target  LDL of less than 70.  Most recent labs showed an LDL of 63.  4.  Essential hypertension: Blood pressure is elevated here but she reports normal readings at home.  Continue to monitor for now.  5.  Left carotid bruit: Most recent carotid Doppler in October 2023 showed mild nonobstructive disease bilaterally.    Disposition:  F/U in 3 months.  Signed,  Antionette Kirks, MD  04/03/2024 1:26 PM    [facility]

## 2024-04-02 NOTE — Patient Instructions (Signed)
 Medication Instructions:  No changes *If you need a refill on your cardiac medications before your next appointment, please call your pharmacy*  Lab Work: None ordered If you have labs (blood work) drawn today and your tests are completely normal, you will receive your results only by: MyChart Message (if you have MyChart) OR A paper copy in the mail If you have any lab test that is abnormal or we need to change your treatment, we will call you to review the results.  Testing/Procedures: None ordered  Follow-Up: At [facility], you and your health needs are our priority.  As part of our continuing mission to provide you with exceptional heart care, our providers are all part of one team.  This team includes your primary Cardiologist (physician) and Advanced Practice Providers or APPs (Physician Assistants and Nurse Practitioners) who all work together to provide you with the care you need, when you need it.  Your next appointment:   3 month(s)  Provider:   Dr. Alvenia Aus  We recommend signing up for the patient portal called "MyChart".  Sign up information is provided on this After Visit Summary.  MyChart is used to connect with patients for Virtual Visits (Telemedicine).  Patients are able to view lab/test results, encounter notes, upcoming appointments, etc.  Non-urgent messages can be sent to your provider as well.   To learn more about what you can do with MyChart, go to [email or web address].

## 2024-04-16 DIAGNOSIS — I739 Peripheral vascular disease, unspecified: Secondary | ICD-10-CM | POA: Diagnosis not present

## 2024-04-16 DIAGNOSIS — L97521 Non-pressure chronic ulcer of other part of left foot limited to breakdown of skin: Secondary | ICD-10-CM | POA: Diagnosis not present

## 2024-04-16 DIAGNOSIS — B351 Tinea unguium: Secondary | ICD-10-CM | POA: Diagnosis not present

## 2024-04-16 DIAGNOSIS — E1140 Type 2 diabetes mellitus with diabetic neuropathy, unspecified: Secondary | ICD-10-CM | POA: Diagnosis not present

## 2024-04-17 DIAGNOSIS — E0822 Diabetes mellitus due to underlying condition with diabetic chronic kidney disease: Secondary | ICD-10-CM | POA: Diagnosis not present

## 2024-04-17 DIAGNOSIS — E785 Hyperlipidemia, unspecified: Secondary | ICD-10-CM | POA: Diagnosis not present

## 2024-04-17 DIAGNOSIS — Z780 Asymptomatic menopausal state: Secondary | ICD-10-CM | POA: Diagnosis not present

## 2024-04-17 DIAGNOSIS — Z0000 Encounter for general adult medical examination without abnormal findings: Secondary | ICD-10-CM | POA: Diagnosis not present

## 2024-04-17 DIAGNOSIS — E1169 Type 2 diabetes mellitus with other specified complication: Secondary | ICD-10-CM | POA: Diagnosis not present

## 2024-04-17 DIAGNOSIS — M8580 Other specified disorders of bone density and structure, unspecified site: Secondary | ICD-10-CM | POA: Diagnosis not present

## 2024-04-17 DIAGNOSIS — N1831 Chronic kidney disease, stage 3a: Secondary | ICD-10-CM | POA: Diagnosis not present

## 2024-04-17 DIAGNOSIS — Z87898 Personal history of other specified conditions: Secondary | ICD-10-CM | POA: Diagnosis not present

## 2024-04-24 DIAGNOSIS — M8588 Other specified disorders of bone density and structure, other site: Secondary | ICD-10-CM | POA: Diagnosis not present

## 2024-04-30 ENCOUNTER — Other Ambulatory Visit: Payer: Self-pay | Admitting: Unknown Physician Specialty

## 2024-04-30 DIAGNOSIS — E041 Nontoxic single thyroid nodule: Secondary | ICD-10-CM

## 2024-05-07 ENCOUNTER — Ambulatory Visit
Admission: RE | Admit: 2024-05-07 | Discharge: 2024-05-07 | Disposition: A | Discharge status: Home / Self Care | Source: Ambulatory Visit | Attending: Unknown Physician Specialty | Admitting: Unknown Physician Specialty

## 2024-05-07 DIAGNOSIS — E041 Nontoxic single thyroid nodule: Secondary | ICD-10-CM

## 2024-05-07 DIAGNOSIS — E042 Nontoxic multinodular goiter: Secondary | ICD-10-CM | POA: Diagnosis not present

## 2024-05-13 DIAGNOSIS — E041 Nontoxic single thyroid nodule: Secondary | ICD-10-CM | POA: Diagnosis not present

## 2024-05-14 ENCOUNTER — Other Ambulatory Visit: Payer: Self-pay | Admitting: Nurse Practitioner

## 2024-05-22 DIAGNOSIS — F411 Generalized anxiety disorder: Secondary | ICD-10-CM | POA: Diagnosis not present

## 2024-05-22 DIAGNOSIS — R42 Dizziness and giddiness: Secondary | ICD-10-CM | POA: Diagnosis not present

## 2024-05-22 DIAGNOSIS — R519 Headache, unspecified: Secondary | ICD-10-CM | POA: Diagnosis not present

## 2024-05-22 DIAGNOSIS — R200 Anesthesia of skin: Secondary | ICD-10-CM | POA: Diagnosis not present

## 2024-06-20 DIAGNOSIS — H348312 Tributary (branch) retinal vein occlusion, right eye, stable: Secondary | ICD-10-CM | POA: Diagnosis not present

## 2024-06-20 DIAGNOSIS — E113553 Type 2 diabetes mellitus with stable proliferative diabetic retinopathy, bilateral: Secondary | ICD-10-CM | POA: Diagnosis not present

## 2024-06-20 DIAGNOSIS — E113293 Type 2 diabetes mellitus with mild nonproliferative diabetic retinopathy without macular edema, bilateral: Secondary | ICD-10-CM | POA: Diagnosis not present

## 2024-06-20 DIAGNOSIS — H2640 Unspecified secondary cataract: Secondary | ICD-10-CM | POA: Diagnosis not present

## 2024-07-16 ENCOUNTER — Ambulatory Visit: Attending: Cardiovascular Disease | Admitting: Cardiovascular Disease

## 2024-07-16 VITALS — BP 130/60 | HR 71 | Ht 65.0 in | Wt 178.5 lb

## 2024-07-16 DIAGNOSIS — R0989 Other specified symptoms and signs involving the circulatory and respiratory systems: Secondary | ICD-10-CM

## 2024-07-16 DIAGNOSIS — I739 Peripheral vascular disease, unspecified: Secondary | ICD-10-CM | POA: Diagnosis not present

## 2024-07-16 DIAGNOSIS — I2510 Atherosclerotic heart disease of native coronary artery without angina pectoris: Secondary | ICD-10-CM | POA: Diagnosis not present

## 2024-07-16 DIAGNOSIS — I10 Essential (primary) hypertension: Secondary | ICD-10-CM

## 2024-07-16 DIAGNOSIS — E785 Hyperlipidemia, unspecified: Secondary | ICD-10-CM

## 2024-07-16 NOTE — Patient Instructions (Signed)
 Medication Instructions:  No chnages *If you need a refill on your cardiac medications before your next appointment, please call your pharmacy*  Lab Work: None ordered If you have labs (blood work) drawn today and your tests are completely normal, you will receive your results only by: MyChart Message (if you have MyChart) OR A paper copy in the mail If you have any lab test that is abnormal or we need to change your treatment, we will call you to review the results.  Testing/Procedures: Your physician has requested that you have a lower extremity arterial duplex in October. During this test, ultrasound is used to evaluate arterial blood flow in the legs. Allow one hour for this exam. There are no restrictions or special instructions. This will take place at [address] [facility] [address], [place] [address]  Your physician has requested that you have an ankle brachial index (ABI) in October. During this test an ultrasound and blood pressure cuff are used to evaluate the arteries that supply the arms and legs with blood.  Allow thirty minutes for this exam.  There are no restrictions or special instructions.  This will take place at [address] [facility], [place] [address]  Please note: We ask at that you not bring children with you during ultrasound (echo/ vascular) testing. Due to room size and safety concerns, children are not allowed in the ultrasound rooms during exams. Our front office staff cannot provide observation of children in our lobby area while testing is being conducted. An adult accompanying a patient to their appointment will only be allowed in the ultrasound room at the discretion of the ultrasound technician under special circumstances. We apologize for any inconvenience.    Follow-Up: At [facility], you and your health needs are our priority.  As part of our continuing mission to provide you with exceptional heart  care, our providers are all part of one team.  This team includes your primary Cardiologist (physician) and Advanced Practice Providers or APPs (Physician Assistants and Nurse Practitioners) who all work together to provide you with the care you need, when you need it.  Your next appointment:   6 month(s)  Provider:   You may see Dr. Darron or one of the following Advanced Practice Providers on your designated Care Team:   Lonni Meager, NP Lesley Maffucci, PA-C Bernardino Bring, PA-C Cadence [place], PA-C Tylene Lunch, NP Barnie Hila, NP    We recommend signing up for the patient portal called MyChart.  Sign up information is provided on this After Visit Summary.  MyChart is used to connect with patients for Virtual Visits (Telemedicine).  Patients are able to view lab/test results, encounter notes, upcoming appointments, etc.  Non-urgent messages can be sent to your provider as well.   To learn more about what you can do with MyChart, go to [email or web address].

## 2024-07-16 NOTE — Progress Notes (Signed)
 Cardiology Office Note   Date:  07/16/2024   ID:  Jasmine Davidson, DOB February 07, 1956, MRN [id number]  PCP:  Landy Margaretann Kay, FNP  Cardiologist: Dr. Perla  Chief Complaint  Patient presents with   Follow-up    3 month f/u no complaints today. Meds reviewed verbally with pt.      History of Present Illness: Jasmine Davidson is a 68 y.o. female who is here today for follow-up visit regarding peripheral arterial disease.   She has history of mild to moderate nonobstructive coronary artery disease, diabetes mellitus, essential hypertension, hyperlipidemia, stage III chronic kidney disease and peripheral arterial disease.  She has followed in the past at [facility] vascular surgery clinic for chronic venous insufficiency status post right GSV stripping and left GSV venous ablation.  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  She had worsening claudication in 2023.  Angiography in October 2023 showed no significant aortoiliac disease but the aorta and the iliac arteries were significantly calcified.  On the left side, there was heavily calcified stenosis in the proximal SFA, short occlusion of the proximal popliteal artery with reconstitution via extensive collaterals above the knee and three-vessel runoff below the knee.  I performed directional atherectomy and drug-coated balloon angioplasty to the proximal left SFA.  The popliteal artery occlusion was left to be treated medically.  She had nonhealing wound on the right big toe in 2024 and thus she underwent peripheral angiography in May of 2024 which showed severe calcified stenosis in the distal SFA extending into the proximal popliteal artery with three-vessel runoff below the knee.  I performed successful directional atherectomy and drug-coated balloon angioplasty to the right distal SFA extending into the popliteal artery. The ulceration on the right big toe healed  completely.   She is being followed closely for left foot ulceration that healed without intervention.  She reports minimal claudication at this time.  No chest pain or shortness of breath.    Past Medical History:  Diagnosis Date   CKD (chronic kidney disease) stage 3, GFR 30-59 ml/min (HCC)    Diabetes mellitus    Diastolic dysfunction    a. 07/2020 Echo: EF 65-70%, no rwma, GrI DD, nl RV fxn, mod dil LA.   HTN (hypertension)    Hyperlipidemia    Non-obstructive CAD (coronary artery disease)    a. 07/2020 Cath: LM nl, LAD 21m, RI small-mild diff dzs, LCX nl, RCA min irregs.   Obesity    PAD (peripheral artery disease) (HCC)    a. 09/2022 LE Angio/PTA: Heavily Ca2+ Abd Ao and iliacs. LCIA 30, LSFA 70p (atherectomy/DBA), L Pop 100 above-knee w/ reconstitution via collats above knee joint and 3V runoff; b. 04/2023 PTA/Angio: Sev Ca2+ dzs dist RSFA extending into prox R popliteal (45-26mm syst gradient)-->directional atherectomy and DCBA.    Past Surgical History:  Procedure Laterality Date   ABDOMINAL AORTOGRAM W/LOWER EXTREMITY N/A 09/14/2022   Procedure: ABDOMINAL AORTOGRAM W/LOWER EXTREMITY;  Surgeon: Darron Deatrice LABOR, MD;  Location: [facility];  Service: Cardiovascular;  Laterality: N/A;   ABDOMINAL AORTOGRAM W/LOWER EXTREMITY N/A 05/03/2023   Procedure: ABDOMINAL AORTOGRAM W/LOWER EXTREMITY;  Surgeon: Darron Deatrice LABOR, MD;  Location: [facility];  Service: Cardiovascular;  Laterality: N/A;   BLADDER REPAIR W/ CESAREAN SECTION  1981   BREAST BIOPSY Right 12/17/2014   negative stereotactic   CARDIAC CATHETERIZATION     COLONOSCOPY WITH  PROPOFOL  N/A 03/30/2021   Procedure: COLONOSCOPY WITH PROPOFOL ;  Surgeon: Maryruth Ole DASEN, MD;  Location: [facility]OSCOPY;  Service: Endoscopy;  Laterality: N/A;  DM   cyst taken off finger  2011   ESOPHAGOGASTRODUODENOSCOPY (EGD) WITH PROPOFOL  N/A 03/30/2021   Procedure: ESOPHAGOGASTRODUODENOSCOPY (EGD) WITH PROPOFOL ;  Surgeon:  Maryruth Ole DASEN, MD;  Location: [facility] ENDOSCOPY;  Service: Endoscopy;  Laterality: N/A;   LEFT HEART CATH AND CORONARY ANGIOGRAPHY N/A 07/21/2020   Procedure: LEFT HEART CATH AND CORONARY ANGIOGRAPHY;  Surgeon: Mady Bruckner, MD;  Location: [facility];  Service: Cardiovascular;  Laterality: N/A;   PERIPHERAL VASCULAR ATHERECTOMY Left 09/14/2022   Procedure: PERIPHERAL VASCULAR ATHERECTOMY;  Surgeon: Darron Deatrice LABOR, MD;  Location: [facility];  Service: Cardiovascular;  Laterality: Left;  SFA   PERIPHERAL VASCULAR BALLOON ANGIOPLASTY Left 09/14/2022   Procedure: PERIPHERAL VASCULAR BALLOON ANGIOPLASTY;  Surgeon: Darron Deatrice LABOR, MD;  Location: [facility];  Service: Cardiovascular;  Laterality: Left;  SFA   PERIPHERAL VASCULAR BALLOON ANGIOPLASTY  05/03/2023   Procedure: PERIPHERAL VASCULAR BALLOON ANGIOPLASTY;  Surgeon: Darron Deatrice LABOR, MD;  Location: [facility];  Service: Cardiovascular;;   TOE SURGERY  02/08/2013     Current Outpatient Medications  Medication Sig Dispense Refill   acetaminophen  (TYLENOL ) 500 MG tablet Take 1,000 mg by mouth every 6 (six) hours as needed for moderate pain.     aspirin  (ASPIRIN  81) 81 MG EC tablet Take 81 mg by mouth daily.     atorvastatin  (LIPITOR) 40 MG tablet TAKE ONE TABLET BY MOUTH EVERY DAY 30 tablet 11   Calcium  Carb-Cholecalciferol (CALCIUM  600 + D PO) Take 2 tablets by mouth daily.     Carboxymethylcellul-Glycerin (REFRESH RELIEVA OP) Place 1 drop into both eyes every other day.     carvedilol  (COREG ) 12.5 MG tablet TAKE 1 TABLET BY MOUTH TWICE A DAY WITH MEAL 180 tablet 3   cetirizine (ZYRTEC) 10 MG tablet Take 10 mg by mouth daily.     Cholecalciferol (EQL VITAMIN D3) 25 MCG (1000 UT) capsule Take 1,000 Units by mouth daily.     empagliflozin  (JARDIANCE ) 25 MG TABS tablet Take 25 mg by mouth daily.     Ferrous Sulfate (IRON) 325 (65 Fe) MG TABS Take 325 mg by mouth daily.     fluticasone (FLONASE) 50  MCG/ACT nasal spray Place 1 spray into both nostrils at bedtime.     furosemide  (LASIX ) 20 MG tablet Take 1 tablet (20 mg total) by mouth daily as needed. 90 tablet 2   gabapentin  (NEURONTIN ) 300 MG capsule Take 300 mg by mouth 2 (two) times daily.      lisinopril  (ZESTRIL ) 20 MG tablet Take 20 mg by mouth daily.     loratadine (CLARITIN) 10 MG tablet Take by mouth.     metFORMIN (GLUCOPHAGE) 1000 MG tablet Take 1,000 mg by mouth 2 (two) times daily.     Neomycin-Bacitracin-Polymyxin (TRIPLE ANTIBIOTIC) OINT Apply 1 Application topically daily as needed (wound care).     OZEMPIC, 0.25 OR 0.5 MG/DOSE, 2 MG/3ML SOPN Inject 0.25 mg into the skin once a week.     venlafaxine (EFFEXOR) 37.5 MG tablet Take 37.5 mg by mouth daily.     vitamin B-12 (CYANOCOBALAMIN) 1000 MCG tablet Take 1,000 mcg by mouth daily.     No current facility-administered medications for this visit.    Allergies:   Pregabalin    Social History:  The patient  reports that she has  never smoked. She has never used smokeless tobacco. She reports that she does not drink alcohol and does not use drugs.   Family History:  The patient's family history includes COPD in her mother; Diabetes in her father; Heart failure in her father; Hypertension in her father; Lupus in her father.    ROS:  Please see the history of present illness.   Otherwise, review of systems are positive for none.   All other systems are reviewed and negative.    PHYSICAL EXAM: VS:  BP 130/60 (BP Location: Left Arm, Patient Position: Sitting, Cuff Size: Normal)   Pulse 71   Ht 5' 5 (1.651 m)   Wt 178 lb 8 oz (81 kg)   LMP 09/12/1999   SpO2 98%   BMI 29.70 kg/m  , BMI Body mass index is 29.7 kg/m. GEN: Well nourished, well developed, in no acute distress  HEENT: normal  Neck: no JVD  or masses.  Left carotid bruit Cardiac: RRR; no murmurs, rubs, or gallops,no edema  Respiratory:  clear to auscultation bilaterally, normal work of breathing GI:  soft, nontender, nondistended, + BS MS: no deformity or atrophy  Skin: warm and dry, no rash Neuro:  Strength and sensation are intact Psych: euthymic mood, full affect Pulses are not palpable in the left foot.     EKG:  EKG is not ordered today.    Recent Labs: No results found for requested labs within last 365 days.    Lipid Panel    Component Value Date/Time   CHOL 108 07/20/2020 0434   TRIG 88 07/20/2020 0434   TRIG 49 11/16/2009 0000   HDL 28 (L) 07/20/2020 0434   CHOLHDL 3.9 07/20/2020 0434   VLDL 18 07/20/2020 0434   LDLCALC 62 07/20/2020 0434   LDLCALC 58 11/16/2009 0000      Wt Readings from Last 3 Encounters:  07/16/24 178 lb 8 oz (81 kg)  04/02/24 187 lb 6.4 oz (85 kg)  03/05/24 186 lb 6.4 oz (84.6 kg)           No data to display            ASSESSMENT AND PLAN:  1.  Peripheral arterial disease: Status post left SFA atherectomy and drug-coated balloon angioplasty .  She is status post atherectomy and drug-coated balloon angioplasty to the right SFA/popliteal arteries .   The ulceration in the left foot healed without intervention.   Continue low-dose aspirin .  Repeat ABI and lower extremity duplex in October.  2.  Coronary artery disease involving native coronary arteries without angina: She reports no anginal symptoms.  Continue medical therapy.  3.  Hyperlipidemia: Continue atorvastatin  40 mg once daily with a target LDL of less than 70.  Most recent labs showed an LDL of 63.  4.  Essential hypertension: Blood pressure is well-controlled on current medications.  5.  Left carotid bruit: Most recent carotid Doppler in October 2023 showed mild nonobstructive disease bilaterally.    Disposition:  F/U in 6 months.  Signed,  Deatrice Cage, MD  07/16/2024 1:29 PM    [facility]

## 2024-07-22 DIAGNOSIS — L97522 Non-pressure chronic ulcer of other part of left foot with fat layer exposed: Secondary | ICD-10-CM | POA: Diagnosis not present

## 2024-07-22 DIAGNOSIS — B351 Tinea unguium: Secondary | ICD-10-CM | POA: Diagnosis not present

## 2024-07-22 DIAGNOSIS — L851 Acquired keratosis [keratoderma] palmaris et plantaris: Secondary | ICD-10-CM | POA: Diagnosis not present

## 2024-07-22 DIAGNOSIS — E1140 Type 2 diabetes mellitus with diabetic neuropathy, unspecified: Secondary | ICD-10-CM | POA: Diagnosis not present

## 2024-08-13 DIAGNOSIS — L97522 Non-pressure chronic ulcer of other part of left foot with fat layer exposed: Secondary | ICD-10-CM | POA: Diagnosis not present

## 2024-08-26 ENCOUNTER — Ambulatory Visit: Attending: Cardiovascular Disease

## 2024-08-26 ENCOUNTER — Ambulatory Visit (INDEPENDENT_AMBULATORY_CARE_PROVIDER_SITE_OTHER)

## 2024-08-26 DIAGNOSIS — I739 Peripheral vascular disease, unspecified: Secondary | ICD-10-CM | POA: Diagnosis not present

## 2024-08-27 LAB — VAS US ABI WITH/WO TBI
Left ABI: 0.73
Right ABI: 1.16

## 2024-08-29 ENCOUNTER — Ambulatory Visit: Payer: Self-pay | Admitting: Cardiovascular Disease

## 2024-08-29 DIAGNOSIS — I739 Peripheral vascular disease, unspecified: Secondary | ICD-10-CM

## 2024-09-03 DIAGNOSIS — L97522 Non-pressure chronic ulcer of other part of left foot with fat layer exposed: Secondary | ICD-10-CM | POA: Diagnosis not present

## 2024-09-24 ENCOUNTER — Encounter

## 2024-09-25 DIAGNOSIS — E1140 Type 2 diabetes mellitus with diabetic neuropathy, unspecified: Secondary | ICD-10-CM | POA: Diagnosis not present

## 2024-09-25 DIAGNOSIS — L97521 Non-pressure chronic ulcer of other part of left foot limited to breakdown of skin: Secondary | ICD-10-CM | POA: Diagnosis not present

## 2024-10-23 DIAGNOSIS — L97521 Non-pressure chronic ulcer of other part of left foot limited to breakdown of skin: Secondary | ICD-10-CM | POA: Diagnosis not present

## 2024-10-23 DIAGNOSIS — L97522 Non-pressure chronic ulcer of other part of left foot with fat layer exposed: Secondary | ICD-10-CM | POA: Diagnosis not present

## 2025-04-18 ENCOUNTER — Ambulatory Visit: Admitting: Cardiovascular Disease
